# Patient Record
Sex: Female | Born: 1935
Health system: Southern US, Community
[De-identification: ages and names within clinical notes are randomized; demographics above are authoritative.]

## PROBLEM LIST (undated history)

## (undated) DIAGNOSIS — I10 Essential (primary) hypertension: Secondary | ICD-10-CM

## (undated) DIAGNOSIS — E119 Type 2 diabetes mellitus without complications: Secondary | ICD-10-CM

## (undated) DIAGNOSIS — E079 Disorder of thyroid, unspecified: Secondary | ICD-10-CM

## (undated) HISTORY — PX: ABDOMINAL HYSTERECTOMY: SHX81

---

## 1958-10-02 HISTORY — PX: FOOT SURGERY: SHX648

## 1998-12-01 ENCOUNTER — Ambulatory Visit (HOSPITAL_COMMUNITY): Admission: RE | Admit: 1998-12-01 | Discharge: 1998-12-01 | Payer: Self-pay | Admitting: Family Medicine

## 1999-12-28 ENCOUNTER — Ambulatory Visit (HOSPITAL_COMMUNITY): Admission: RE | Admit: 1999-12-28 | Discharge: 1999-12-28 | Payer: Self-pay | Admitting: Family Medicine

## 1999-12-28 ENCOUNTER — Encounter: Payer: Self-pay | Admitting: Family Medicine

## 2000-01-05 ENCOUNTER — Encounter: Payer: Self-pay | Admitting: Family Medicine

## 2000-01-05 ENCOUNTER — Encounter: Admission: RE | Admit: 2000-01-05 | Discharge: 2000-01-05 | Payer: Self-pay | Admitting: Family Medicine

## 2000-05-18 ENCOUNTER — Other Ambulatory Visit: Admission: RE | Admit: 2000-05-18 | Discharge: 2000-05-18 | Payer: Self-pay | Admitting: Family Medicine

## 2000-12-13 ENCOUNTER — Ambulatory Visit (HOSPITAL_COMMUNITY): Admission: RE | Admit: 2000-12-13 | Discharge: 2000-12-13 | Payer: Self-pay | Admitting: Family Medicine

## 2000-12-13 ENCOUNTER — Encounter: Payer: Self-pay | Admitting: Family Medicine

## 2001-01-07 ENCOUNTER — Ambulatory Visit (HOSPITAL_COMMUNITY): Admission: RE | Admit: 2001-01-07 | Discharge: 2001-01-07 | Payer: Self-pay | Admitting: Family Medicine

## 2002-01-09 ENCOUNTER — Encounter: Payer: Self-pay | Admitting: Family Medicine

## 2002-01-09 ENCOUNTER — Encounter: Admission: RE | Admit: 2002-01-09 | Discharge: 2002-01-09 | Payer: Self-pay | Admitting: Family Medicine

## 2002-04-15 ENCOUNTER — Ambulatory Visit (HOSPITAL_COMMUNITY): Admission: RE | Admit: 2002-04-15 | Discharge: 2002-04-15 | Payer: Self-pay | Admitting: Family Medicine

## 2003-01-30 ENCOUNTER — Encounter: Admission: RE | Admit: 2003-01-30 | Discharge: 2003-01-30 | Payer: Self-pay | Admitting: Family Medicine

## 2003-01-30 ENCOUNTER — Encounter: Payer: Self-pay | Admitting: Family Medicine

## 2003-06-18 ENCOUNTER — Encounter: Payer: Self-pay | Admitting: Family Medicine

## 2003-06-18 ENCOUNTER — Encounter: Admission: RE | Admit: 2003-06-18 | Discharge: 2003-06-18 | Payer: Self-pay | Admitting: Family Medicine

## 2003-12-23 ENCOUNTER — Ambulatory Visit (HOSPITAL_COMMUNITY): Admission: RE | Admit: 2003-12-23 | Discharge: 2003-12-23 | Payer: Self-pay | Admitting: Family Medicine

## 2004-02-23 ENCOUNTER — Ambulatory Visit (HOSPITAL_COMMUNITY): Admission: RE | Admit: 2004-02-23 | Discharge: 2004-02-23 | Payer: Self-pay | Admitting: Family Medicine

## 2004-04-12 ENCOUNTER — Ambulatory Visit (HOSPITAL_COMMUNITY): Admission: RE | Admit: 2004-04-12 | Discharge: 2004-04-12 | Payer: Self-pay | Admitting: Family Medicine

## 2004-07-08 ENCOUNTER — Ambulatory Visit: Payer: Self-pay | Admitting: Family Medicine

## 2004-11-09 ENCOUNTER — Ambulatory Visit: Payer: Self-pay | Admitting: Family Medicine

## 2005-03-15 ENCOUNTER — Ambulatory Visit (HOSPITAL_COMMUNITY): Admission: RE | Admit: 2005-03-15 | Discharge: 2005-03-15 | Payer: Self-pay | Admitting: Family Medicine

## 2005-06-13 ENCOUNTER — Ambulatory Visit: Payer: Self-pay | Admitting: Family Medicine

## 2005-07-18 ENCOUNTER — Ambulatory Visit: Payer: Self-pay | Admitting: Family Medicine

## 2005-07-18 ENCOUNTER — Ambulatory Visit: Payer: Self-pay | Admitting: Internal Medicine

## 2005-12-05 ENCOUNTER — Ambulatory Visit: Payer: Self-pay | Admitting: Family Medicine

## 2005-12-26 ENCOUNTER — Ambulatory Visit: Payer: Self-pay | Admitting: Family Medicine

## 2006-01-23 ENCOUNTER — Ambulatory Visit (HOSPITAL_COMMUNITY): Admission: RE | Admit: 2006-01-23 | Discharge: 2006-01-23 | Payer: Self-pay | Admitting: Family Medicine

## 2006-03-08 ENCOUNTER — Ambulatory Visit: Payer: Self-pay | Admitting: Family Medicine

## 2006-03-20 ENCOUNTER — Encounter: Admission: RE | Admit: 2006-03-20 | Discharge: 2006-03-20 | Payer: Self-pay | Admitting: Family Medicine

## 2006-10-23 ENCOUNTER — Ambulatory Visit: Payer: Self-pay | Admitting: Family Medicine

## 2006-11-28 ENCOUNTER — Ambulatory Visit: Payer: Self-pay | Admitting: Family Medicine

## 2007-03-26 ENCOUNTER — Ambulatory Visit: Payer: Self-pay | Admitting: Family Medicine

## 2007-03-27 ENCOUNTER — Encounter: Admission: RE | Admit: 2007-03-27 | Discharge: 2007-03-27 | Payer: Self-pay | Admitting: Family Medicine

## 2007-06-25 ENCOUNTER — Ambulatory Visit: Payer: Self-pay | Admitting: Family Medicine

## 2007-06-25 LAB — CONVERTED CEMR LAB
Calcium: 9.3 mg/dL (ref 8.4–10.5)
Chloride: 105 meq/L (ref 96–112)
Cholesterol: 129 mg/dL (ref 0–200)
Creatinine, Ser: 0.77 mg/dL (ref 0.40–1.20)
HDL: 66 mg/dL (ref >39)
Potassium: 3.9 meq/L (ref 3.5–5.3)
Sodium: 141 meq/L (ref 135–145)
TSH: 1.381 microintl units/mL (ref 0.350–5.50)

## 2007-07-09 ENCOUNTER — Ambulatory Visit: Payer: Self-pay | Admitting: Family Medicine

## 2007-07-16 ENCOUNTER — Ambulatory Visit (HOSPITAL_COMMUNITY): Admission: RE | Admit: 2007-07-16 | Discharge: 2007-07-17 | Payer: Self-pay | Admitting: Ophthalmology

## 2007-08-20 ENCOUNTER — Ambulatory Visit: Payer: Self-pay | Admitting: Family Medicine

## 2007-09-04 ENCOUNTER — Ambulatory Visit (HOSPITAL_COMMUNITY): Admission: RE | Admit: 2007-09-04 | Discharge: 2007-09-04 | Payer: Self-pay | Admitting: Family Medicine

## 2008-02-11 ENCOUNTER — Ambulatory Visit: Payer: Self-pay | Admitting: Family Medicine

## 2008-02-11 LAB — CONVERTED CEMR LAB
ALT: 12 units/L (ref 0–35)
BUN: 14 mg/dL (ref 6–23)
Basophils Absolute: 0 10*3/uL (ref 0.0–0.1)
Calcium: 9.6 mg/dL (ref 8.4–10.5)
Chloride: 102 meq/L (ref 96–112)
Creatinine, Ser: 0.71 mg/dL (ref 0.40–1.20)
Eosinophils Absolute: 0.2 10*3/uL (ref 0.0–0.7)
Eosinophils Relative: 2 % (ref 0–5)
Lymphocytes Relative: 29 % (ref 12–46)
Lymphs Abs: 2.5 10*3/uL (ref 0.7–4.0)
Monocytes Relative: 9 % (ref 3–12)
Neutrophils Relative %: 59 % (ref 43–77)
Platelets: 264 10*3/uL (ref 150–400)
Potassium: 3.9 meq/L (ref 3.5–5.3)
RDW: 15.9 % — ABNORMAL HIGH (ref 11.5–15.5)
Sodium: 143 meq/L (ref 135–145)
TSH: 2.66 microintl units/mL (ref 0.350–5.50)

## 2008-03-31 ENCOUNTER — Encounter: Admission: RE | Admit: 2008-03-31 | Discharge: 2008-03-31 | Payer: Self-pay | Admitting: Family Medicine

## 2008-07-28 ENCOUNTER — Ambulatory Visit: Payer: Self-pay | Admitting: Family Medicine

## 2008-10-06 ENCOUNTER — Ambulatory Visit: Payer: Self-pay | Admitting: Family Medicine

## 2008-10-06 LAB — CONVERTED CEMR LAB
BUN: 18 mg/dL (ref 6–23)
Creatinine, Ser: 0.87 mg/dL (ref 0.40–1.20)
Glucose, Bld: 105 mg/dL — ABNORMAL HIGH (ref 70–99)
Potassium: 3.9 meq/L (ref 3.5–5.3)

## 2009-05-31 ENCOUNTER — Ambulatory Visit: Payer: Self-pay | Admitting: Family Medicine

## 2009-05-31 LAB — CONVERTED CEMR LAB
Basophils Absolute: 0 10*3/uL (ref 0.0–0.1)
Basophils Relative: 0 % (ref 0–1)
Eosinophils Relative: 2 % (ref 0–5)
HCT: 37.6 % (ref 36.0–46.0)
Hemoglobin: 11.8 g/dL — ABNORMAL LOW (ref 12.0–15.0)
Lymphocytes Relative: 26 % (ref 12–46)
Lymphs Abs: 1.8 10*3/uL (ref 0.7–4.0)
MCHC: 31.4 g/dL (ref 30.0–36.0)
Monocytes Absolute: 0.5 10*3/uL (ref 0.1–1.0)
Neutrophils Relative %: 64 % (ref 43–77)
WBC: 7.1 10*3/uL (ref 4.0–10.5)

## 2009-06-15 ENCOUNTER — Ambulatory Visit (HOSPITAL_COMMUNITY): Admission: RE | Admit: 2009-06-15 | Discharge: 2009-06-15 | Payer: Self-pay | Admitting: Family Medicine

## 2009-11-09 ENCOUNTER — Ambulatory Visit: Payer: Self-pay | Admitting: Family Medicine

## 2009-12-14 ENCOUNTER — Ambulatory Visit: Payer: Self-pay | Admitting: Internal Medicine

## 2010-03-08 ENCOUNTER — Ambulatory Visit: Payer: Self-pay | Admitting: Family Medicine

## 2010-03-08 LAB — CONVERTED CEMR LAB
AST: 17 units/L (ref 0–37)
CO2: 25 meq/L (ref 19–32)
Free T4: 1.33 ng/dL (ref 0.80–1.80)
Glucose, Bld: 73 mg/dL (ref 70–99)
HDL: 70 mg/dL (ref >39)
LDL Cholesterol: 95 mg/dL (ref 0–99)
Potassium: 4 meq/L (ref 3.5–5.3)
Sodium: 143 meq/L (ref 135–145)
Total Bilirubin: 0.4 mg/dL (ref 0.3–1.2)
Triglycerides: 59 mg/dL (ref <150)
VLDL: 12 mg/dL (ref 0–40)
Vit D, 25-Hydroxy: 33 ng/mL (ref 30–89)

## 2010-03-31 ENCOUNTER — Ambulatory Visit (HOSPITAL_COMMUNITY): Admission: RE | Admit: 2010-03-31 | Discharge: 2010-03-31 | Payer: Self-pay | Admitting: Family Medicine

## 2010-06-21 ENCOUNTER — Ambulatory Visit (HOSPITAL_COMMUNITY): Admission: RE | Admit: 2010-06-21 | Discharge: 2010-06-21 | Payer: Self-pay | Admitting: Family Medicine

## 2010-10-23 ENCOUNTER — Encounter: Payer: Self-pay | Admitting: Family Medicine

## 2011-02-14 NOTE — Op Note (Signed)
NAMEGRADIE, BUTRICK               ACCOUNT NO.:  [id number]   MEDICAL RECORD NO.:  [id number]          PATIENT TYPE:  AMB   LOCATION:  SDS                          FACILITY:  [facility]   PHYSICIAN:  John D. Ashley Royalty, M.D. DATE OF BIRTH:  08/06/1926   DATE OF PROCEDURE:  07/16/2007  DATE OF DISCHARGE:                               OPERATIVE REPORT   ADMISSION DIAGNOSIS:  Macular hole, left eye and surface retinal  membrane, left eye.   PROCEDURE:  Pars plana vitrectomy left eye, retinal photocoagulation  left eye, membrane peel left eye, serum patch left eye, gas fluid  exchange left eye, Perfluoron propane injection left eye.   SURGEON:  Alan Mulder, M.D.   ASSISTANT:  Rosalie Doctor, MA   ANESTHESIA:  General.   DETAILS:  Usual prep and drape, peritomies at 10, 2 and 4 o'clock, 5 mm  infusion port anchored into place at 4 o'clock.  Contact lens ring  anchored into place at 6 and 12 o'clock.  Provisc placed on the corneal  surface and the flat contact lens was placed.  Pars plana vitrectomy was  begun just behind the pseudophakos.  Vitreous membranes and pigment was  encountered and carefully removed under low suction and rapid cutting.  The vitrectomy was carried down to the macular surface and all vitreous  was removed from the posterior segment.  The silicone tip suction line  was drawn down to the macular surface and the fish strike sign occurred.  A sheet of vitreous was lifted from its attachment to the edge of the  macular hole in the edge of the disk.  It was drawn up into the central  vitreous cavity and removed with the vitreous cutter.  The vitrectomy  was carried out to the far periphery with 30 degrees prismatic lens for  viewing.  The entire vitreous base was trimmed.  The indirect  ophthalmoscope laser was moved into place, 460 burns placed around the  retinal periphery with a power of 400 milliwatts, 1000 microns each and  0.1 seconds each.  The magnifying contact  lens was placed on the corneal  surface and attention was carried to the surface membrane.  The diamond  dusted membrane scraper and the lighted pick were used to engage the  membrane and peel it from its attachments to the macula and the macular  hole edge.  This was lifted free and removed with the vitreous cutter.  The internal limiting membrane was removed.  The gas fluid exchange was  carried out.  Sufficient time was allowed for additional fluid to track  down the walls the eye and collect in the posterior segment.  Serum  patch was prepared during this time and the perfluoropropane was mixed  to 17% concentration.  Serum patch was delivered.  Additional fluid was  removed from the posterior segment.  The perfluoropropane was exchanged  for intravitreal gas at 17% concentration.  The instruments were removed  from the eye and 9-0 nylon was used to close sclerotomy sites.  They  were tested and found to be tight.  The conjunctiva was  closed with wet-  field cautery.  Polymyxin and gentamicin were irrigated into Tenon's  space, Decadron 10 mg was injected to the lower subconjunctival space.  Marcaine was injected around the globe for postop pain.  TobraDex  ophthalmic ointment, a  patch and shield were placed.  The patient was awakened, taken to  recovery in satisfactory condition.  Closing pressure 15 with a  [place] tonometer,  Operative time one hour. The patient is awakened,  taken to recovery in satisfactory condition.      Jennifer Zhang. Ashley Royalty, M.D.  Electronically Signed     JDM/MEDQ  D:  07/16/2007  T:  07/17/2007  Job:  [id number]

## 2011-06-19 ENCOUNTER — Other Ambulatory Visit (HOSPITAL_COMMUNITY): Payer: Self-pay | Admitting: Family Medicine

## 2011-06-19 ENCOUNTER — Other Ambulatory Visit: Payer: Self-pay | Admitting: Family Medicine

## 2011-06-19 DIAGNOSIS — Z1231 Encounter for screening mammogram for malignant neoplasm of breast: Secondary | ICD-10-CM

## 2011-06-27 ENCOUNTER — Ambulatory Visit
Admission: RE | Admit: 2011-06-27 | Discharge: 2011-06-27 | Disposition: A | Discharge status: Home / Self Care | Payer: Medicare Other | Source: Ambulatory Visit | Attending: Family Medicine | Admitting: Family Medicine

## 2011-06-27 DIAGNOSIS — Z1231 Encounter for screening mammogram for malignant neoplasm of breast: Secondary | ICD-10-CM

## 2011-06-28 ENCOUNTER — Ambulatory Visit: Payer: Self-pay

## 2011-07-13 LAB — BASIC METABOLIC PANEL
CO2: 27
Calcium: 9.5
Chloride: 103
GFR calc Af Amer: >60
GFR calc non Af Amer: >60
Potassium: 3.9
Sodium: 140

## 2011-07-13 LAB — CBC
MCHC: 33.5
Platelets: 248
RBC: 4.18
RDW: 15.5 — ABNORMAL HIGH

## 2011-07-13 LAB — AUTOLOGOUS SERUM PATCH PREP

## 2012-01-30 ENCOUNTER — Encounter (INDEPENDENT_AMBULATORY_CARE_PROVIDER_SITE_OTHER): Payer: Medicare Other | Admitting: Ophthalmology

## 2012-01-30 DIAGNOSIS — E11319 Type 2 diabetes mellitus with unspecified diabetic retinopathy without macular edema: Secondary | ICD-10-CM

## 2012-01-30 DIAGNOSIS — I10 Essential (primary) hypertension: Secondary | ICD-10-CM

## 2012-01-30 DIAGNOSIS — E1165 Type 2 diabetes mellitus with hyperglycemia: Secondary | ICD-10-CM

## 2012-01-30 DIAGNOSIS — H35039 Hypertensive retinopathy, unspecified eye: Secondary | ICD-10-CM

## 2012-01-30 DIAGNOSIS — H35349 Macular cyst, hole, or pseudohole, unspecified eye: Secondary | ICD-10-CM

## 2012-01-30 DIAGNOSIS — H43819 Vitreous degeneration, unspecified eye: Secondary | ICD-10-CM

## 2012-06-17 ENCOUNTER — Other Ambulatory Visit: Payer: Self-pay | Admitting: Family Medicine

## 2012-12-03 ENCOUNTER — Other Ambulatory Visit: Payer: Self-pay

## 2013-01-03 ENCOUNTER — Ambulatory Visit: Payer: Medicare Other

## 2013-01-06 ENCOUNTER — Ambulatory Visit
Admission: RE | Admit: 2013-01-06 | Discharge: 2013-01-06 | Disposition: A | Discharge status: Home / Self Care | Payer: Medicare Other | Source: Ambulatory Visit

## 2013-01-06 DIAGNOSIS — Z1231 Encounter for screening mammogram for malignant neoplasm of breast: Secondary | ICD-10-CM

## 2013-01-29 ENCOUNTER — Ambulatory Visit (INDEPENDENT_AMBULATORY_CARE_PROVIDER_SITE_OTHER): Payer: Medicare Other | Admitting: Ophthalmology

## 2013-01-29 DIAGNOSIS — E1139 Type 2 diabetes mellitus with other diabetic ophthalmic complication: Secondary | ICD-10-CM

## 2013-01-29 DIAGNOSIS — H35039 Hypertensive retinopathy, unspecified eye: Secondary | ICD-10-CM

## 2013-01-29 DIAGNOSIS — H43819 Vitreous degeneration, unspecified eye: Secondary | ICD-10-CM

## 2013-01-29 DIAGNOSIS — I10 Essential (primary) hypertension: Secondary | ICD-10-CM

## 2013-01-29 DIAGNOSIS — E11319 Type 2 diabetes mellitus with unspecified diabetic retinopathy without macular edema: Secondary | ICD-10-CM

## 2013-01-29 DIAGNOSIS — H3530 Unspecified macular degeneration: Secondary | ICD-10-CM

## 2013-01-29 DIAGNOSIS — H35349 Macular cyst, hole, or pseudohole, unspecified eye: Secondary | ICD-10-CM

## 2013-02-04 ENCOUNTER — Ambulatory Visit: Payer: Medicare Other

## 2013-02-11 ENCOUNTER — Ambulatory Visit: Payer: Medicare Other | Attending: Internal Medicine

## 2013-02-11 DIAGNOSIS — IMO0001 Reserved for inherently not codable concepts without codable children: Secondary | ICD-10-CM | POA: Insufficient documentation

## 2013-02-11 DIAGNOSIS — R262 Difficulty in walking, not elsewhere classified: Secondary | ICD-10-CM | POA: Insufficient documentation

## 2013-02-11 DIAGNOSIS — R42 Dizziness and giddiness: Secondary | ICD-10-CM | POA: Insufficient documentation

## 2013-02-11 DIAGNOSIS — R269 Unspecified abnormalities of gait and mobility: Secondary | ICD-10-CM | POA: Insufficient documentation

## 2013-02-14 ENCOUNTER — Ambulatory Visit: Payer: Medicare Other | Admitting: Rehabilitative and Restorative Service Providers"

## 2013-02-17 ENCOUNTER — Ambulatory Visit: Payer: Medicare Other | Admitting: Rehabilitative and Restorative Service Providers"

## 2013-02-19 ENCOUNTER — Ambulatory Visit: Payer: Medicare Other

## 2013-02-26 ENCOUNTER — Ambulatory Visit: Payer: Medicare Other | Admitting: Rehabilitative and Restorative Service Providers"

## 2013-02-28 ENCOUNTER — Ambulatory Visit: Payer: Medicare Other | Admitting: Rehabilitative and Restorative Service Providers"

## 2013-03-03 ENCOUNTER — Ambulatory Visit: Payer: Medicare Other | Attending: Internal Medicine

## 2013-03-03 DIAGNOSIS — R42 Dizziness and giddiness: Secondary | ICD-10-CM | POA: Insufficient documentation

## 2013-03-03 DIAGNOSIS — R269 Unspecified abnormalities of gait and mobility: Secondary | ICD-10-CM | POA: Insufficient documentation

## 2013-03-03 DIAGNOSIS — IMO0001 Reserved for inherently not codable concepts without codable children: Secondary | ICD-10-CM | POA: Insufficient documentation

## 2013-03-03 DIAGNOSIS — R262 Difficulty in walking, not elsewhere classified: Secondary | ICD-10-CM | POA: Insufficient documentation

## 2013-03-05 ENCOUNTER — Ambulatory Visit: Payer: Medicare Other | Admitting: Rehabilitative and Restorative Service Providers"

## 2013-03-10 ENCOUNTER — Ambulatory Visit: Payer: Medicare Other

## 2013-03-12 ENCOUNTER — Ambulatory Visit: Payer: Medicare Other

## 2013-03-17 ENCOUNTER — Ambulatory Visit: Payer: Medicare Other

## 2013-03-19 ENCOUNTER — Ambulatory Visit: Payer: Medicare Other | Admitting: Rehabilitative and Restorative Service Providers"

## 2013-03-24 ENCOUNTER — Ambulatory Visit: Payer: Medicare Other | Admitting: Physical Therapy

## 2013-03-26 ENCOUNTER — Ambulatory Visit: Payer: Medicare Other | Admitting: Physical Therapy

## 2013-03-31 ENCOUNTER — Ambulatory Visit: Payer: Medicare Other | Admitting: Physical Therapy

## 2013-04-02 ENCOUNTER — Ambulatory Visit: Payer: Medicare Other | Attending: Internal Medicine | Admitting: Rehabilitative and Restorative Service Providers"

## 2013-04-02 DIAGNOSIS — R269 Unspecified abnormalities of gait and mobility: Secondary | ICD-10-CM | POA: Insufficient documentation

## 2013-04-02 DIAGNOSIS — R42 Dizziness and giddiness: Secondary | ICD-10-CM | POA: Insufficient documentation

## 2013-04-02 DIAGNOSIS — R262 Difficulty in walking, not elsewhere classified: Secondary | ICD-10-CM | POA: Insufficient documentation

## 2013-04-02 DIAGNOSIS — IMO0001 Reserved for inherently not codable concepts without codable children: Secondary | ICD-10-CM | POA: Insufficient documentation

## 2013-04-07 ENCOUNTER — Ambulatory Visit: Payer: Medicare Other | Admitting: Rehabilitative and Restorative Service Providers"

## 2013-04-09 ENCOUNTER — Ambulatory Visit: Payer: Medicare Other | Admitting: Rehabilitative and Restorative Service Providers"

## 2013-04-14 ENCOUNTER — Ambulatory Visit: Payer: Medicare Other | Admitting: Rehabilitative and Restorative Service Providers"

## 2013-04-16 ENCOUNTER — Ambulatory Visit: Payer: Medicare Other | Admitting: Rehabilitative and Restorative Service Providers"

## 2013-04-21 ENCOUNTER — Ambulatory Visit: Payer: Medicare Other

## 2013-04-23 ENCOUNTER — Ambulatory Visit: Payer: Medicare Other

## 2014-01-08 ENCOUNTER — Other Ambulatory Visit: Payer: Self-pay

## 2014-01-08 DIAGNOSIS — Z1231 Encounter for screening mammogram for malignant neoplasm of breast: Secondary | ICD-10-CM

## 2014-02-02 ENCOUNTER — Ambulatory Visit
Admission: RE | Admit: 2014-02-02 | Discharge: 2014-02-02 | Disposition: A | Discharge status: Home / Self Care | Payer: Commercial Managed Care - HMO | Source: Ambulatory Visit

## 2014-02-02 ENCOUNTER — Encounter (INDEPENDENT_AMBULATORY_CARE_PROVIDER_SITE_OTHER): Payer: Self-pay

## 2014-02-02 ENCOUNTER — Ambulatory Visit (INDEPENDENT_AMBULATORY_CARE_PROVIDER_SITE_OTHER): Payer: Medicare Other | Admitting: Ophthalmology

## 2014-02-02 DIAGNOSIS — Z1231 Encounter for screening mammogram for malignant neoplasm of breast: Secondary | ICD-10-CM

## 2014-02-04 ENCOUNTER — Ambulatory Visit (INDEPENDENT_AMBULATORY_CARE_PROVIDER_SITE_OTHER): Payer: Medicare HMO | Admitting: Ophthalmology

## 2014-02-04 DIAGNOSIS — E11319 Type 2 diabetes mellitus with unspecified diabetic retinopathy without macular edema: Secondary | ICD-10-CM

## 2014-02-04 DIAGNOSIS — H35039 Hypertensive retinopathy, unspecified eye: Secondary | ICD-10-CM

## 2014-02-04 DIAGNOSIS — H35369 Drusen (degenerative) of macula, unspecified eye: Secondary | ICD-10-CM

## 2014-02-04 DIAGNOSIS — H43819 Vitreous degeneration, unspecified eye: Secondary | ICD-10-CM

## 2014-02-04 DIAGNOSIS — I10 Essential (primary) hypertension: Secondary | ICD-10-CM

## 2014-02-04 DIAGNOSIS — E1139 Type 2 diabetes mellitus with other diabetic ophthalmic complication: Secondary | ICD-10-CM

## 2014-02-04 DIAGNOSIS — E1165 Type 2 diabetes mellitus with hyperglycemia: Secondary | ICD-10-CM

## 2014-09-10 ENCOUNTER — Emergency Department (HOSPITAL_COMMUNITY): Payer: Medicare HMO

## 2014-09-10 ENCOUNTER — Emergency Department (HOSPITAL_COMMUNITY)
Admission: EM | Admit: 2014-09-10 | Discharge: 2014-09-10 | Disposition: A | Discharge status: Home / Self Care | Payer: Medicare HMO | Attending: Emergency Medicine | Admitting: Emergency Medicine

## 2014-09-10 ENCOUNTER — Encounter (HOSPITAL_COMMUNITY): Payer: Self-pay | Admitting: *Deleted

## 2014-09-10 DIAGNOSIS — E119 Type 2 diabetes mellitus without complications: Secondary | ICD-10-CM | POA: Insufficient documentation

## 2014-09-10 DIAGNOSIS — R42 Dizziness and giddiness: Secondary | ICD-10-CM | POA: Insufficient documentation

## 2014-09-10 DIAGNOSIS — Z79899 Other long term (current) drug therapy: Secondary | ICD-10-CM | POA: Insufficient documentation

## 2014-09-10 DIAGNOSIS — R112 Nausea with vomiting, unspecified: Secondary | ICD-10-CM | POA: Insufficient documentation

## 2014-09-10 DIAGNOSIS — I10 Essential (primary) hypertension: Secondary | ICD-10-CM | POA: Insufficient documentation

## 2014-09-10 DIAGNOSIS — Z87891 Personal history of nicotine dependence: Secondary | ICD-10-CM | POA: Diagnosis not present

## 2014-09-10 DIAGNOSIS — E079 Disorder of thyroid, unspecified: Secondary | ICD-10-CM | POA: Diagnosis not present

## 2014-09-10 DIAGNOSIS — Z794 Long term (current) use of insulin: Secondary | ICD-10-CM | POA: Diagnosis not present

## 2014-09-10 DIAGNOSIS — R61 Generalized hyperhidrosis: Secondary | ICD-10-CM | POA: Insufficient documentation

## 2014-09-10 HISTORY — DX: Type 2 diabetes mellitus without complications: E11.9

## 2014-09-10 HISTORY — DX: Essential (primary) hypertension: I10

## 2014-09-10 HISTORY — DX: Disorder of thyroid, unspecified: E07.9

## 2014-09-10 LAB — URINALYSIS, ROUTINE W REFLEX MICROSCOPIC
BILIRUBIN URINE: NEGATIVE
Glucose, UA: NEGATIVE mg/dL
Hgb urine dipstick: NEGATIVE
KETONES UR: NEGATIVE mg/dL
NITRITE: NEGATIVE
Protein, ur: NEGATIVE mg/dL
Specific Gravity, Urine: 1.017 (ref 1.005–1.030)
UROBILINOGEN UA: 0.2 mg/dL (ref 0.0–1.0)
pH: 6.5 (ref 5.0–8.0)

## 2014-09-10 LAB — BASIC METABOLIC PANEL
Anion gap: 11 (ref 5–15)
BUN: 12 mg/dL (ref 6–23)
CHLORIDE: 104 meq/L (ref 96–112)
CO2: 26 mEq/L (ref 19–32)
CREATININE: 0.64 mg/dL (ref 0.50–1.10)
Calcium: 9.4 mg/dL (ref 8.4–10.5)
GFR calc non Af Amer: 79 mL/min — ABNORMAL LOW (ref >90)
Glucose, Bld: 120 mg/dL — ABNORMAL HIGH (ref 70–99)
Potassium: 3.6 mEq/L — ABNORMAL LOW (ref 3.7–5.3)
SODIUM: 141 meq/L (ref 137–147)

## 2014-09-10 LAB — CBC WITH DIFFERENTIAL/PLATELET
BASOS ABS: 0 10*3/uL (ref 0.0–0.1)
BASOS PCT: 0 % (ref 0–1)
Eosinophils Absolute: 0.2 10*3/uL (ref 0.0–0.7)
Eosinophils Relative: 2 % (ref 0–5)
HEMATOCRIT: 34.3 % — AB (ref 36.0–46.0)
Hemoglobin: 11.2 g/dL — ABNORMAL LOW (ref 12.0–15.0)
LYMPHS PCT: 17 % (ref 12–46)
Lymphs Abs: 1.6 10*3/uL (ref 0.7–4.0)
MCH: 29.2 pg (ref 26.0–34.0)
MCHC: 32.7 g/dL (ref 30.0–36.0)
MCV: 89.6 fL (ref 78.0–100.0)
MONO ABS: 0.6 10*3/uL (ref 0.1–1.0)
Monocytes Relative: 6 % (ref 3–12)
NEUTROS PCT: 75 % (ref 43–77)
Neutro Abs: 6.9 10*3/uL (ref 1.7–7.7)
Platelets: 207 10*3/uL (ref 150–400)
RBC: 3.83 MIL/uL — ABNORMAL LOW (ref 3.87–5.11)
RDW: 14.9 % (ref 11.5–15.5)
WBC: 9.2 10*3/uL (ref 4.0–10.5)

## 2014-09-10 LAB — URINE MICROSCOPIC-ADD ON

## 2014-09-10 MED ORDER — ONDANSETRON 4 MG PO TBDP: 4.0000 mg | ORAL_TABLET | Freq: Three times a day (TID) | ORAL | Status: DC | PRN

## 2014-09-10 MED ORDER — ONDANSETRON 4 MG PO TBDP
4.0000 mg | ORAL_TABLET | Freq: Once | ORAL | Status: AC
  Administered 2014-09-10: 4 mg via ORAL
  Filled 2014-09-10: qty 1

## 2014-09-10 NOTE — Discharge Instructions (Signed)
Discharged home go home and rest. Take Zofran as needed for the nausea. Return for any new or worse symptoms persistent dizziness or persistent vomiting. Make an appointment to follow-up with your regular doctor.

## 2014-09-10 NOTE — ED Notes (Signed)
Pt [facility]. Pt states she was riding the bus when she became extremely dizzy and became diaphoretic. Pt. Reports she began feeling weak and sleepy and is still having these sx, but the dizziness and diaphoresis has resolved.

## 2014-09-10 NOTE — ED Provider Notes (Signed)
CSN: [id number]     Arrival date & time 09/10/14  [id number] History   First MD Initiated Contact with Patient 09/10/14 0857     Chief Complaint  Patient presents with  . Dizziness  . Weakness     (Consider location/radiation/quality/duration/timing/severity/associated sxs/prior Treatment) Patient is a 80 y.o. female presenting with dizziness and weakness. The history is provided by the patient and the EMS personnel.  Dizziness Associated symptoms: nausea and vomiting   Associated symptoms: no chest pain, no headaches and no shortness of breath   Weakness Pertinent negatives include no chest pain, no abdominal pain, no headaches and no shortness of breath.   patient brought in by EMS she was on the [facility] bus. Patient became dizzy diaphoretic nauseated. Patient had vomiting 1 at about 8 in the morning prior to getting on the bus. Patient denied any shortness of breath any chest pain or headache. He did have some diaphoresis. Patient now feels better.  Past Medical History  Diagnosis Date  . Diabetes mellitus without complication   . Hypertension   . Thyroid disease    Past Surgical History  Procedure Laterality Date  . Abdominal hysterectomy    . Foot surgery Right 1960   Family History  Problem Relation Age of Onset  . Cancer Mother   . Hypertension Mother   . Migraines Mother   . Diabetes Brother    History  Substance Use Topics  . Smoking status: Former Games developermoker  . Smokeless tobacco: Not on file  . Alcohol Use: No   OB History    No data available     Review of Systems  Constitutional: Positive for diaphoresis. Negative for fever.  HENT: Negative for congestion.   Eyes: Negative for visual disturbance.  Respiratory: Negative for shortness of breath.   Cardiovascular: Negative for chest pain.  Gastrointestinal: Positive for nausea and vomiting. Negative for abdominal pain.  Genitourinary: Negative for dysuria.  Musculoskeletal: Negative for back pain.   Skin: Negative for rash.  Neurological: Positive for dizziness. Negative for syncope, speech difficulty, weakness, numbness and headaches.  Hematological: Does not bruise/bleed easily.  Psychiatric/Behavioral: Negative for confusion.      Allergies  Aspirin  Home Medications   Prior to Admission medications   Medication Sig Start Date End Date Taking? Authorizing Provider  cholecalciferol (VITAMIN D) 1000 UNITS tablet Take 2,000 Units by mouth daily.   Yes Historical Provider, MD  Insulin Aspart Prot & Aspart (NOVOLOG MIX 70/30 FLEXPEN) (70-30) 100 UNIT/ML Pen Inject 15 Units into the skin 2 (two) times daily.   Yes Historical Provider, MD  isosorbide dinitrate (ISORDIL) 10 MG tablet Take 10 mg by mouth 2 (two) times daily.   Yes Historical Provider, MD  levothyroxine (SYNTHROID, LEVOTHROID) 50 MCG tablet Take 50 mcg by mouth daily before breakfast.   Yes Historical Provider, MD  lisinopril-hydrochlorothiazide (PRINZIDE,ZESTORETIC) 20-25 MG per tablet Take 1 tablet by mouth daily.   Yes Historical Provider, MD  potassium chloride (K-DUR,KLOR-CON) 10 MEQ tablet Take 10 mEq by mouth 2 (two) times daily.   Yes Historical Provider, MD  pravastatin (PRAVACHOL) 80 MG tablet Take 80 mg by mouth daily.   Yes Historical Provider, MD  thiamine (VITAMIN B-1) 100 MG tablet Take 100 mg by mouth daily.   Yes Historical Provider, MD  verapamil (CALAN-SR) 240 MG CR tablet Take 240 mg by mouth at bedtime.   Yes Historical Provider, MD  vitamin B-12 (CYANOCOBALAMIN) 100 MCG tablet Take 100 mcg by mouth daily.  Yes Historical Provider, MD  ondansetron (ZOFRAN ODT) 4 MG disintegrating tablet Take 1 tablet (4 mg total) by mouth every 8 (eight) hours as needed for nausea or vomiting. 09/10/14   Vanetta Mulders, MD   BP 124/91 mmHg  Pulse 53  Temp(Src) 97.9 F (36.6 C) (Oral)  Resp 20  Ht 5\' 4"  (1.626 m)  Wt 186 lb (84.369 kg)  BMI 31.91 kg/m2  SpO2 96% Physical Exam  Constitutional: She is oriented  to person, place, and time. She appears well-developed and well-nourished. No distress.  HENT:  Head: Normocephalic and atraumatic.  Mouth/Throat: Oropharynx is clear and moist.  Eyes: Conjunctivae and EOM are normal. Pupils are equal, round, and reactive to light.  Neck: Normal range of motion.  Cardiovascular: Normal rate, regular rhythm and normal heart sounds.   No murmur heard. Pulmonary/Chest: Effort normal and breath sounds normal. No respiratory distress.  Abdominal: Soft. Bowel sounds are normal. There is no tenderness.  Musculoskeletal: Normal range of motion. She exhibits no edema.  Neurological: She is alert and oriented to person, place, and time. No cranial nerve deficit. She exhibits normal muscle tone. Coordination normal.  Skin: Skin is warm. No rash noted.  Nursing note and vitals reviewed.   ED Course  Procedures (including critical care time) Labs Review Labs Reviewed  CBC WITH DIFFERENTIAL - Abnormal; Notable for the following:    RBC 3.83 (*)    Hemoglobin 11.2 (*)    HCT 34.3 (*)    All other components within normal limits  BASIC METABOLIC PANEL - Abnormal; Notable for the following:    Potassium 3.6 (*)    Glucose, Bld 120 (*)    GFR calc non Af Amer 79 (*)    All other components within normal limits  URINALYSIS, ROUTINE W REFLEX MICROSCOPIC - Abnormal; Notable for the following:    Leukocytes, UA SMALL (*)    All other components within normal limits  URINE MICROSCOPIC-ADD ON   Results for orders placed or performed during the hospital encounter of 09/10/14  CBC with Differential  Result Value Ref Range   WBC 9.2 4.0 - 10.5 K/uL   RBC 3.83 (L) 3.87 - 5.11 MIL/uL   Hemoglobin 11.2 (L) 12.0 - 15.0 g/dL   HCT 40.9 (L) 81.1 - 91.4 %   MCV 89.6 78.0 - 100.0 fL   MCH 29.2 26.0 - 34.0 pg   MCHC 32.7 30.0 - 36.0 g/dL   RDW 78.2 95.6 - 21.3 %   Platelets 207 150 - 400 K/uL   Neutrophils Relative % 75 43 - 77 %   Neutro Abs 6.9 1.7 - 7.7 K/uL    Lymphocytes Relative 17 12 - 46 %   Lymphs Abs 1.6 0.7 - 4.0 K/uL   Monocytes Relative 6 3 - 12 %   Monocytes Absolute 0.6 0.1 - 1.0 K/uL   Eosinophils Relative 2 0 - 5 %   Eosinophils Absolute 0.2 0.0 - 0.7 K/uL   Basophils Relative 0 0 - 1 %   Basophils Absolute 0.0 0.0 - 0.1 K/uL  Basic metabolic panel  Result Value Ref Range   Sodium 141 137 - 147 mEq/L   Potassium 3.6 (L) 3.7 - 5.3 mEq/L   Chloride 104 96 - 112 mEq/L   CO2 26 19 - 32 mEq/L   Glucose, Bld 120 (H) 70 - 99 mg/dL   BUN 12 6 - 23 mg/dL   Creatinine, Ser 0.86 0.50 - 1.10 mg/dL   Calcium 9.4 8.4 -  10.5 mg/dL   GFR calc non Af Amer 79 (L) >90 mL/min   GFR calc Af Amer >90 >90 mL/min   Anion gap 11 5 - 15  Urinalysis, Routine w reflex microscopic  Result Value Ref Range   Color, Urine YELLOW YELLOW   APPearance CLEAR CLEAR   Specific Gravity, Urine 1.017 1.005 - 1.030   pH 6.5 5.0 - 8.0   Glucose, UA NEGATIVE NEGATIVE mg/dL   Hgb urine dipstick NEGATIVE NEGATIVE   Bilirubin Urine NEGATIVE NEGATIVE   Ketones, ur NEGATIVE NEGATIVE mg/dL   Protein, ur NEGATIVE NEGATIVE mg/dL   Urobilinogen, UA 0.2 0.0 - 1.0 mg/dL   Nitrite NEGATIVE NEGATIVE   Leukocytes, UA SMALL (A) NEGATIVE  Urine microscopic-add on  Result Value Ref Range   Squamous Epithelial / LPF RARE RARE   WBC, UA 0-2 <3 WBC/hpf    Imaging Review Dg Chest 2 View  09/10/2014   CLINICAL DATA:  Dizziness.  Transient diaphoresis.  EXAM: CHEST  2 VIEW  COMPARISON:  07/16/2007  FINDINGS: Heart size and pulmonary vascularity are normal. Slight scarring at both lung bases, stable. No effusions or infiltrates. No acute osseous abnormality. Thoracolumbar scoliosis, unchanged.  IMPRESSION: No acute abnormalities.   Electronically Signed   By: Geanie CooleyJim  Maxwell M.D.   On: 09/10/2014 10:55     EKG Interpretation   Date/Time:  Thursday September 10 2014 09:40:04 EST Ventricular Rate:  58 PR Interval:  147 QRS Duration: 74 QT Interval:  475 QTC Calculation: 467 R  Axis:   -15 Text Interpretation:  Sinus rhythm Left ventricular hypertrophy No  previous tracing Confirmed by Calisa Luckenbaugh  MD, Nastashia Gallo ([id number]) on 09/10/2014  9:58:00 AM      MDM   Final diagnoses:  Dizziness    Patient brought in by EMS. Patient [facility] she became extremely dizzy diaphoretic felt nauseated. Patient had vomited once earlier today before going to the senior center. That was about 8:00. Patient now feels better. Denied any chest pain shortness of breath headache no syncope. Patient's basic lab workup without any significant findings.  Patient be discharged home with Zofran precautions and follow-up with her doctor. Patient will return for any new or worse symptoms.  Patient symptoms could be related to a viral gastroenteritis. Even no there is no diarrhea. Patient's symptoms also could be related to dizziness that led to the vomiting and diaphoresis. No persistent dizziness.  Vanetta MuldersScott Meloney Feld, MD 09/10/14 1327

## 2015-01-27 ENCOUNTER — Other Ambulatory Visit: Payer: Self-pay

## 2015-01-27 DIAGNOSIS — Z1231 Encounter for screening mammogram for malignant neoplasm of breast: Secondary | ICD-10-CM

## 2015-02-10 ENCOUNTER — Ambulatory Visit (INDEPENDENT_AMBULATORY_CARE_PROVIDER_SITE_OTHER): Payer: Medicare HMO | Admitting: Ophthalmology

## 2015-02-11 ENCOUNTER — Ambulatory Visit
Admission: RE | Admit: 2015-02-11 | Discharge: 2015-02-11 | Disposition: A | Discharge status: Home / Self Care | Payer: Medicare HMO | Source: Ambulatory Visit

## 2015-02-11 DIAGNOSIS — Z1231 Encounter for screening mammogram for malignant neoplasm of breast: Secondary | ICD-10-CM

## 2015-09-10 ENCOUNTER — Encounter: Payer: Self-pay | Admitting: Podiatry

## 2015-09-10 ENCOUNTER — Ambulatory Visit (INDEPENDENT_AMBULATORY_CARE_PROVIDER_SITE_OTHER): Payer: Medicare HMO | Admitting: Podiatry

## 2015-09-10 VITALS — BP 137/75 | HR 84 | Resp 14

## 2015-09-10 DIAGNOSIS — L84 Corns and callosities: Secondary | ICD-10-CM

## 2015-09-10 DIAGNOSIS — B351 Tinea unguium: Secondary | ICD-10-CM

## 2015-09-10 DIAGNOSIS — M79676 Pain in unspecified toe(s): Secondary | ICD-10-CM | POA: Diagnosis not present

## 2015-09-10 NOTE — Progress Notes (Signed)
   Subjective:    Patient ID: Jennifer Zhang, female    DOB: 06-14-1926, 81 y.o.   MRN: [id number]  HPIThis patient presents to the office today with painful toenails on both feet and painful corn fifth toe right foot.  She says her nails have grown long and thick and are painful walking and wearing her shoes.  Paniful corn walking in shoes.  She presents for preventive foot care services. The patient presents here today with B/L toenail debridement.   Review of Systems  All other systems reviewed and are negative.      Objective:   Physical Exam GENERAL APPEARANCE: Alert, conversant. Appropriately groomed. No acute distress.  VASCULAR: Pedal [facility]able [facility]  [facility].  Capillary refill time is immediate to all digits,  Normal temperature gradient.  Digital hair growth is present bilateral  NEUROLOGIC: sensation is normal to 5.07 monofilament at 5/5 sites bilateral.  Light touch is intact bilateral, Muscle strength normal.  MUSCULOSKELETAL: acceptable muscle strength, tone and stability bilateral.  Intrinsic muscluature intact bilateral.  Rectus appearance of foot and digits noted bilateral.   DERMATOLOGIC: skin color, texture, and turgor are within normal limits.  No preulcerative lesions or ulcers  are seen, no interdigital maceration noted.  No open lesions present.   No drainage noted. Heloma durum fifth toe right foot.  NAILS  Thick disfigured discolored nails with subungual debris.         Assessment & Plan:  Onychomycosis  B/L  Heloma durum fifth toe right  IE  Debride nails both feet.  Debride corn. RTC 3 months  Helane GuntherGregory Decarla Siemen DPM

## 2015-11-01 DIAGNOSIS — Z0000 Encounter for general adult medical examination without abnormal findings: Secondary | ICD-10-CM | POA: Diagnosis not present

## 2015-11-01 DIAGNOSIS — M5137 Other intervertebral disc degeneration, lumbosacral region: Secondary | ICD-10-CM | POA: Diagnosis not present

## 2015-11-01 DIAGNOSIS — E785 Hyperlipidemia, unspecified: Secondary | ICD-10-CM | POA: Diagnosis not present

## 2015-11-01 DIAGNOSIS — R011 Cardiac murmur, unspecified: Secondary | ICD-10-CM | POA: Diagnosis not present

## 2015-11-01 DIAGNOSIS — M179 Osteoarthritis of knee, unspecified: Secondary | ICD-10-CM | POA: Diagnosis not present

## 2015-11-01 DIAGNOSIS — E119 Type 2 diabetes mellitus without complications: Secondary | ICD-10-CM | POA: Diagnosis not present

## 2015-11-01 DIAGNOSIS — E1140 Type 2 diabetes mellitus with diabetic neuropathy, unspecified: Secondary | ICD-10-CM | POA: Diagnosis not present

## 2015-11-01 DIAGNOSIS — I2510 Atherosclerotic heart disease of native coronary artery without angina pectoris: Secondary | ICD-10-CM | POA: Diagnosis not present

## 2015-11-01 DIAGNOSIS — H3530 Unspecified macular degeneration: Secondary | ICD-10-CM | POA: Diagnosis not present

## 2015-11-01 DIAGNOSIS — I10 Essential (primary) hypertension: Secondary | ICD-10-CM | POA: Diagnosis not present

## 2015-11-01 DIAGNOSIS — E559 Vitamin D deficiency, unspecified: Secondary | ICD-10-CM | POA: Diagnosis not present

## 2015-11-12 DIAGNOSIS — I10 Essential (primary) hypertension: Secondary | ICD-10-CM | POA: Diagnosis not present

## 2015-11-12 DIAGNOSIS — I2510 Atherosclerotic heart disease of native coronary artery without angina pectoris: Secondary | ICD-10-CM | POA: Diagnosis not present

## 2015-11-12 DIAGNOSIS — R011 Cardiac murmur, unspecified: Secondary | ICD-10-CM | POA: Diagnosis not present

## 2015-11-30 DIAGNOSIS — I2510 Atherosclerotic heart disease of native coronary artery without angina pectoris: Secondary | ICD-10-CM | POA: Diagnosis not present

## 2015-11-30 DIAGNOSIS — I10 Essential (primary) hypertension: Secondary | ICD-10-CM | POA: Diagnosis not present

## 2015-11-30 DIAGNOSIS — M179 Osteoarthritis of knee, unspecified: Secondary | ICD-10-CM | POA: Diagnosis not present

## 2015-11-30 DIAGNOSIS — E1140 Type 2 diabetes mellitus with diabetic neuropathy, unspecified: Secondary | ICD-10-CM | POA: Diagnosis not present

## 2015-11-30 DIAGNOSIS — H3530 Unspecified macular degeneration: Secondary | ICD-10-CM | POA: Diagnosis not present

## 2015-11-30 DIAGNOSIS — E559 Vitamin D deficiency, unspecified: Secondary | ICD-10-CM | POA: Diagnosis not present

## 2015-11-30 DIAGNOSIS — E119 Type 2 diabetes mellitus without complications: Secondary | ICD-10-CM | POA: Diagnosis not present

## 2015-11-30 DIAGNOSIS — M5137 Other intervertebral disc degeneration, lumbosacral region: Secondary | ICD-10-CM | POA: Diagnosis not present

## 2015-11-30 DIAGNOSIS — E785 Hyperlipidemia, unspecified: Secondary | ICD-10-CM | POA: Diagnosis not present

## 2015-12-24 ENCOUNTER — Ambulatory Visit (INDEPENDENT_AMBULATORY_CARE_PROVIDER_SITE_OTHER): Payer: Medicare HMO | Admitting: Podiatry

## 2015-12-24 ENCOUNTER — Encounter: Payer: Self-pay | Admitting: Podiatry

## 2015-12-24 DIAGNOSIS — B351 Tinea unguium: Secondary | ICD-10-CM

## 2015-12-24 DIAGNOSIS — M79676 Pain in unspecified toe(s): Secondary | ICD-10-CM

## 2015-12-24 DIAGNOSIS — L84 Corns and callosities: Secondary | ICD-10-CM

## 2015-12-24 NOTE — Progress Notes (Signed)
   Subjective:    Patient ID: Jennifer Zhang, female    DOB: November 09, 1925, 82 y.o.   MRN: [id number]  HPIThis patient presents to the office today with painful toenails on both feet and painful corn fifth toe right foot.  She says her nails have grown long and thick and are painful walking and wearing her shoes.  Paniful corn walking in shoes.  She presents for preventive foot care services. The patient presents here today with B/L toenail debridement.   Review of Systems  All other systems reviewed and are negative.      Objective:   Physical Exam GENERAL APPEARANCE: Alert, conversant. Appropriately groomed. No acute distress.  VASCULAR: [facility]  [facility].  Capillary refill time is immediate to all digits,  Normal temperature gradient.  Digital hair growth is present bilateral  NEUROLOGIC: sensation is normal to 5.07 monofilament at 5/5 sites bilateral.  Light touch is intact bilateral, Muscle strength normal.  MUSCULOSKELETAL: acceptable muscle strength, tone and stability bilateral.  Intrinsic muscluature intact bilateral.  Rectus appearance of foot and digits noted bilateral.   DERMATOLOGIC: skin color, texture, and turgor are within normal limits.  No preulcerative lesions or ulcers  are seen, no interdigital maceration noted.  No open lesions present.   No drainage noted. Heloma durum fifth toe right foot. Pes planus B/L.  HAV B/L  NAILS  Thick disfigured discolored nails with subungual debris.         Assessment & Plan:  Onychomycosis  B/L  Heloma durum fifth toe right  IE  Debride nails both feet.  Debride corn. RTC 3 months  Helane GuntherGregory Kaycen Whitworth DPM

## 2015-12-30 DIAGNOSIS — I10 Essential (primary) hypertension: Secondary | ICD-10-CM | POA: Diagnosis not present

## 2015-12-30 DIAGNOSIS — E559 Vitamin D deficiency, unspecified: Secondary | ICD-10-CM | POA: Diagnosis not present

## 2016-01-20 DIAGNOSIS — I10 Essential (primary) hypertension: Secondary | ICD-10-CM | POA: Diagnosis not present

## 2016-01-20 DIAGNOSIS — E559 Vitamin D deficiency, unspecified: Secondary | ICD-10-CM | POA: Diagnosis not present

## 2016-02-02 ENCOUNTER — Other Ambulatory Visit: Payer: Self-pay

## 2016-02-02 DIAGNOSIS — Z1231 Encounter for screening mammogram for malignant neoplasm of breast: Secondary | ICD-10-CM

## 2016-02-10 DIAGNOSIS — E559 Vitamin D deficiency, unspecified: Secondary | ICD-10-CM | POA: Diagnosis not present

## 2016-02-10 DIAGNOSIS — I10 Essential (primary) hypertension: Secondary | ICD-10-CM | POA: Diagnosis not present

## 2016-02-17 ENCOUNTER — Ambulatory Visit
Admission: RE | Admit: 2016-02-17 | Discharge: 2016-02-17 | Disposition: A | Discharge status: Home / Self Care | Payer: Medicare HMO | Source: Ambulatory Visit

## 2016-02-17 DIAGNOSIS — Z1231 Encounter for screening mammogram for malignant neoplasm of breast: Secondary | ICD-10-CM

## 2016-02-29 DIAGNOSIS — E559 Vitamin D deficiency, unspecified: Secondary | ICD-10-CM | POA: Diagnosis not present

## 2016-02-29 DIAGNOSIS — M5137 Other intervertebral disc degeneration, lumbosacral region: Secondary | ICD-10-CM | POA: Diagnosis not present

## 2016-02-29 DIAGNOSIS — H3530 Unspecified macular degeneration: Secondary | ICD-10-CM | POA: Diagnosis not present

## 2016-02-29 DIAGNOSIS — M179 Osteoarthritis of knee, unspecified: Secondary | ICD-10-CM | POA: Diagnosis not present

## 2016-02-29 DIAGNOSIS — I2510 Atherosclerotic heart disease of native coronary artery without angina pectoris: Secondary | ICD-10-CM | POA: Diagnosis not present

## 2016-02-29 DIAGNOSIS — E785 Hyperlipidemia, unspecified: Secondary | ICD-10-CM | POA: Diagnosis not present

## 2016-02-29 DIAGNOSIS — E119 Type 2 diabetes mellitus without complications: Secondary | ICD-10-CM | POA: Diagnosis not present

## 2016-02-29 DIAGNOSIS — I10 Essential (primary) hypertension: Secondary | ICD-10-CM | POA: Diagnosis not present

## 2016-02-29 DIAGNOSIS — E1140 Type 2 diabetes mellitus with diabetic neuropathy, unspecified: Secondary | ICD-10-CM | POA: Diagnosis not present

## 2016-03-09 DIAGNOSIS — E559 Vitamin D deficiency, unspecified: Secondary | ICD-10-CM | POA: Diagnosis not present

## 2016-03-09 DIAGNOSIS — I10 Essential (primary) hypertension: Secondary | ICD-10-CM | POA: Diagnosis not present

## 2016-03-31 ENCOUNTER — Ambulatory Visit: Payer: Medicare HMO | Admitting: Podiatry

## 2016-04-06 DIAGNOSIS — H3530 Unspecified macular degeneration: Secondary | ICD-10-CM | POA: Diagnosis not present

## 2016-04-06 DIAGNOSIS — M179 Osteoarthritis of knee, unspecified: Secondary | ICD-10-CM | POA: Diagnosis not present

## 2016-04-06 DIAGNOSIS — M5137 Other intervertebral disc degeneration, lumbosacral region: Secondary | ICD-10-CM | POA: Diagnosis not present

## 2016-04-06 DIAGNOSIS — E785 Hyperlipidemia, unspecified: Secondary | ICD-10-CM | POA: Diagnosis not present

## 2016-04-06 DIAGNOSIS — E1140 Type 2 diabetes mellitus with diabetic neuropathy, unspecified: Secondary | ICD-10-CM | POA: Diagnosis not present

## 2016-04-06 DIAGNOSIS — E119 Type 2 diabetes mellitus without complications: Secondary | ICD-10-CM | POA: Diagnosis not present

## 2016-04-06 DIAGNOSIS — E039 Hypothyroidism, unspecified: Secondary | ICD-10-CM | POA: Diagnosis not present

## 2016-04-06 DIAGNOSIS — I10 Essential (primary) hypertension: Secondary | ICD-10-CM | POA: Diagnosis not present

## 2016-04-06 DIAGNOSIS — I2510 Atherosclerotic heart disease of native coronary artery without angina pectoris: Secondary | ICD-10-CM | POA: Diagnosis not present

## 2016-04-11 DIAGNOSIS — E039 Hypothyroidism, unspecified: Secondary | ICD-10-CM | POA: Diagnosis not present

## 2016-04-11 DIAGNOSIS — E785 Hyperlipidemia, unspecified: Secondary | ICD-10-CM | POA: Diagnosis not present

## 2016-04-11 DIAGNOSIS — E119 Type 2 diabetes mellitus without complications: Secondary | ICD-10-CM | POA: Diagnosis not present

## 2016-04-11 DIAGNOSIS — M545 Low back pain: Secondary | ICD-10-CM | POA: Diagnosis not present

## 2016-04-11 DIAGNOSIS — I10 Essential (primary) hypertension: Secondary | ICD-10-CM | POA: Diagnosis not present

## 2016-04-11 DIAGNOSIS — Z6833 Body mass index (BMI) 33.0-33.9, adult: Secondary | ICD-10-CM | POA: Diagnosis not present

## 2016-04-17 DIAGNOSIS — I10 Essential (primary) hypertension: Secondary | ICD-10-CM | POA: Diagnosis not present

## 2016-04-17 DIAGNOSIS — E559 Vitamin D deficiency, unspecified: Secondary | ICD-10-CM | POA: Diagnosis not present

## 2016-04-28 ENCOUNTER — Ambulatory Visit (INDEPENDENT_AMBULATORY_CARE_PROVIDER_SITE_OTHER): Payer: Commercial Managed Care - HMO | Admitting: Podiatry

## 2016-04-28 ENCOUNTER — Encounter: Payer: Self-pay | Admitting: Podiatry

## 2016-04-28 DIAGNOSIS — M79676 Pain in unspecified toe(s): Secondary | ICD-10-CM

## 2016-04-28 DIAGNOSIS — B351 Tinea unguium: Secondary | ICD-10-CM | POA: Diagnosis not present

## 2016-04-28 DIAGNOSIS — E119 Type 2 diabetes mellitus without complications: Secondary | ICD-10-CM

## 2016-04-28 NOTE — Progress Notes (Signed)
   Subjective:    Patient ID: Jennifer Zhang, female    DOB: 07-Jun-1926, 82 y.o.   MRN: [id number]  HPIThis patient presents to the office today with painful toenails on both feet and painful corn fifth toe right foot.  She says her nails have grown long and thick and are painful walking and wearing her shoes.  .  She presents for preventive foot care services. The patient presents here today with B/L toenail debridement.   Review of Systems  All other systems reviewed and are negative.      Objective:   Physical Exam GENERAL APPEARANCE: Alert, conversant. Appropriately groomed. No acute distress.  VASCULAR: Pedal pulses palpable at  [facility] and PT bilateral.  Capillary refill time is immediate to all digits,  Normal temperature gradient.  Digital hair growth is present bilateral  NEUROLOGIC: sensation is normal to 5.07 monofilament at 5/5 sites bilateral.  Light touch is intact bilateral, Muscle strength normal.  MUSCULOSKELETAL: acceptable muscle strength, tone and stability bilateral.  Intrinsic muscluature intact bilateral.  Rectus appearance of foot and digits noted bilateral.   DERMATOLOGIC: skin color, texture, and turgor are within normal limits.  No preulcerative lesions or ulcers  are seen, no interdigital maceration noted.  No open lesions present.   No drainage noted.  NAILS  Thick disfigured discolored nails with subungual debris.         Assessment & Plan:  Onychomycosis  B/L    IE  Debride nails both feet.   RTC 3 months  Helane Gunther DPM

## 2016-05-16 DIAGNOSIS — E559 Vitamin D deficiency, unspecified: Secondary | ICD-10-CM | POA: Diagnosis not present

## 2016-05-16 DIAGNOSIS — I10 Essential (primary) hypertension: Secondary | ICD-10-CM | POA: Diagnosis not present

## 2016-06-29 DIAGNOSIS — I10 Essential (primary) hypertension: Secondary | ICD-10-CM | POA: Diagnosis not present

## 2016-06-29 DIAGNOSIS — E559 Vitamin D deficiency, unspecified: Secondary | ICD-10-CM | POA: Diagnosis not present

## 2016-07-13 DIAGNOSIS — H3530 Unspecified macular degeneration: Secondary | ICD-10-CM | POA: Diagnosis not present

## 2016-07-13 DIAGNOSIS — I10 Essential (primary) hypertension: Secondary | ICD-10-CM | POA: Diagnosis not present

## 2016-07-13 DIAGNOSIS — E039 Hypothyroidism, unspecified: Secondary | ICD-10-CM | POA: Diagnosis not present

## 2016-07-13 DIAGNOSIS — E785 Hyperlipidemia, unspecified: Secondary | ICD-10-CM | POA: Diagnosis not present

## 2016-07-13 DIAGNOSIS — M179 Osteoarthritis of knee, unspecified: Secondary | ICD-10-CM | POA: Diagnosis not present

## 2016-07-13 DIAGNOSIS — E119 Type 2 diabetes mellitus without complications: Secondary | ICD-10-CM | POA: Diagnosis not present

## 2016-07-13 DIAGNOSIS — I2510 Atherosclerotic heart disease of native coronary artery without angina pectoris: Secondary | ICD-10-CM | POA: Diagnosis not present

## 2016-07-13 DIAGNOSIS — M5137 Other intervertebral disc degeneration, lumbosacral region: Secondary | ICD-10-CM | POA: Diagnosis not present

## 2016-07-13 DIAGNOSIS — E1140 Type 2 diabetes mellitus with diabetic neuropathy, unspecified: Secondary | ICD-10-CM | POA: Diagnosis not present

## 2016-07-27 DIAGNOSIS — I10 Essential (primary) hypertension: Secondary | ICD-10-CM | POA: Diagnosis not present

## 2016-07-27 DIAGNOSIS — E559 Vitamin D deficiency, unspecified: Secondary | ICD-10-CM | POA: Diagnosis not present

## 2016-07-28 ENCOUNTER — Ambulatory Visit (INDEPENDENT_AMBULATORY_CARE_PROVIDER_SITE_OTHER): Payer: Commercial Managed Care - HMO | Admitting: Podiatry

## 2016-07-28 ENCOUNTER — Encounter: Payer: Self-pay | Admitting: Podiatry

## 2016-07-28 DIAGNOSIS — M79676 Pain in unspecified toe(s): Secondary | ICD-10-CM | POA: Diagnosis not present

## 2016-07-28 DIAGNOSIS — E119 Type 2 diabetes mellitus without complications: Secondary | ICD-10-CM | POA: Diagnosis not present

## 2016-07-28 DIAGNOSIS — B351 Tinea unguium: Secondary | ICD-10-CM

## 2016-07-28 NOTE — Progress Notes (Signed)
   Subjective:    Patient ID: Jennifer Zhang, female    DOB: 03/28/1926, 82 y.o.   MRN: [id number]  HPIThis patient presents to the office today with painful toenails on both feet and painful corn fifth toe right foot.  She says her nails have grown long and thick and are painful walking and wearing her shoes.  .  She presents for preventive foot care services. The patient presents here today with B/L toenail debridement.   Review of Systems  All other systems reviewed and are negative.      Objective:   Physical Exam GENERAL APPEARANCE: Alert, conversant. Appropriately groomed. No acute distress.  VASCULAR: Pedal pulses palpable at  DP and PT bilateral.  Capillary refill time is immediate to all digits,  Normal temperature gradient.  Digital hair growth is present bilateral  NEUROLOGIC: sensation is normal to 5.07 monofilament at 5/5 sites bilateral.  Light touch is intact bilateral, Muscle strength normal.  MUSCULOSKELETAL: acceptable muscle strength, tone and stability bilateral.  Intrinsic muscluature intact bilateral.  Rectus appearance of foot and digits noted bilateral.   DERMATOLOGIC: skin color, texture, and turgor are within normal limits.  No preulcerative lesions or ulcers  are seen, no interdigital maceration noted.  No open lesions present.   No drainage noted.  NAILS  Thick disfigured discolored nails with subungual debris.         Assessment & Plan:  Onychomycosis  B/L    IE  Debride nails both feet.   RTC 3 months  Alazia Crocket DPM 

## 2016-08-23 DIAGNOSIS — E559 Vitamin D deficiency, unspecified: Secondary | ICD-10-CM | POA: Diagnosis not present

## 2016-08-23 DIAGNOSIS — I10 Essential (primary) hypertension: Secondary | ICD-10-CM | POA: Diagnosis not present

## 2016-09-05 DIAGNOSIS — E785 Hyperlipidemia, unspecified: Secondary | ICD-10-CM | POA: Diagnosis not present

## 2016-09-05 DIAGNOSIS — M5137 Other intervertebral disc degeneration, lumbosacral region: Secondary | ICD-10-CM | POA: Diagnosis not present

## 2016-09-05 DIAGNOSIS — I10 Essential (primary) hypertension: Secondary | ICD-10-CM | POA: Diagnosis not present

## 2016-09-05 DIAGNOSIS — I2510 Atherosclerotic heart disease of native coronary artery without angina pectoris: Secondary | ICD-10-CM | POA: Diagnosis not present

## 2016-09-05 DIAGNOSIS — E119 Type 2 diabetes mellitus without complications: Secondary | ICD-10-CM | POA: Diagnosis not present

## 2016-09-05 DIAGNOSIS — E1140 Type 2 diabetes mellitus with diabetic neuropathy, unspecified: Secondary | ICD-10-CM | POA: Diagnosis not present

## 2016-09-05 DIAGNOSIS — E039 Hypothyroidism, unspecified: Secondary | ICD-10-CM | POA: Diagnosis not present

## 2016-09-05 DIAGNOSIS — H3530 Unspecified macular degeneration: Secondary | ICD-10-CM | POA: Diagnosis not present

## 2016-09-05 DIAGNOSIS — M179 Osteoarthritis of knee, unspecified: Secondary | ICD-10-CM | POA: Diagnosis not present

## 2016-09-18 DIAGNOSIS — E119 Type 2 diabetes mellitus without complications: Secondary | ICD-10-CM | POA: Diagnosis not present

## 2016-09-18 DIAGNOSIS — E785 Hyperlipidemia, unspecified: Secondary | ICD-10-CM | POA: Diagnosis not present

## 2016-09-18 DIAGNOSIS — I10 Essential (primary) hypertension: Secondary | ICD-10-CM | POA: Diagnosis not present

## 2016-09-18 DIAGNOSIS — I2510 Atherosclerotic heart disease of native coronary artery without angina pectoris: Secondary | ICD-10-CM | POA: Diagnosis not present

## 2016-09-18 DIAGNOSIS — M179 Osteoarthritis of knee, unspecified: Secondary | ICD-10-CM | POA: Diagnosis not present

## 2016-09-18 DIAGNOSIS — E1140 Type 2 diabetes mellitus with diabetic neuropathy, unspecified: Secondary | ICD-10-CM | POA: Diagnosis not present

## 2016-09-18 DIAGNOSIS — Z0000 Encounter for general adult medical examination without abnormal findings: Secondary | ICD-10-CM | POA: Diagnosis not present

## 2016-09-18 DIAGNOSIS — M5137 Other intervertebral disc degeneration, lumbosacral region: Secondary | ICD-10-CM | POA: Diagnosis not present

## 2016-09-18 DIAGNOSIS — E039 Hypothyroidism, unspecified: Secondary | ICD-10-CM | POA: Diagnosis not present

## 2016-09-21 DIAGNOSIS — I10 Essential (primary) hypertension: Secondary | ICD-10-CM | POA: Diagnosis not present

## 2016-09-21 DIAGNOSIS — E559 Vitamin D deficiency, unspecified: Secondary | ICD-10-CM | POA: Diagnosis not present

## 2016-10-27 ENCOUNTER — Ambulatory Visit (INDEPENDENT_AMBULATORY_CARE_PROVIDER_SITE_OTHER): Payer: Medicaid Other | Admitting: Podiatry

## 2016-10-27 ENCOUNTER — Encounter: Payer: Self-pay | Admitting: Podiatry

## 2016-10-27 DIAGNOSIS — B351 Tinea unguium: Secondary | ICD-10-CM | POA: Diagnosis not present

## 2016-10-27 DIAGNOSIS — M79676 Pain in unspecified toe(s): Secondary | ICD-10-CM

## 2016-10-27 DIAGNOSIS — E119 Type 2 diabetes mellitus without complications: Secondary | ICD-10-CM

## 2016-10-27 NOTE — Progress Notes (Signed)
   Subjective:    Patient ID: Jennifer Zhang, female    DOB: 1926-02-14, 83 y.o.   MRN: [id number]  HPIThis patient presents to the office today with painful toenails on both feet and painful corn fifth toe right foot.  She says her nails have grown long and thick and are painful walking and wearing her shoes.  .  She presents for preventive foot care services. The patient presents here today with B/L toenail debridement.   Review of Systems  All other systems reviewed and are negative.      Objective:   Physical Exam GENERAL APPEARANCE: Alert, conversant. Appropriately groomed. No acute distress.  VASC[facility]: [facility]  [facility].  Capillary refill time is immediate to all digits,  Normal temperature gradient.  Digital hair growth is present bilateral  NEUROLOGIC: sensation is normal to 5.07 monofilament at 5/5 sites bilateral.  Light touch is intact bilateral, Muscle strength normal.  MUSCULOSKELETAL: acceptable muscle strength, tone and stability bilateral.  Intrinsic muscluature intact bilateral.  Rectus appearance of foot and digits noted bilateral.   DERMATOLOGIC: skin color, texture, and turgor are within normal limits.  No preulcerative lesions or ulcers  are seen, no interdigital maceration noted.  No open lesions present.   No drainage noted.  NAILS  Thick disfigured discolored nails with subungual debris.         Assessment & Plan:  Onychomycosis  B/L    IE  Debride nails both feet.   RTC 3 months.  Patients says she gets her diabetic shoes elsewhere.  Helane GuntherGregory Korey Prashad DPM

## 2016-10-31 DIAGNOSIS — H538 Other visual disturbances: Secondary | ICD-10-CM | POA: Diagnosis not present

## 2016-10-31 DIAGNOSIS — Z0000 Encounter for general adult medical examination without abnormal findings: Secondary | ICD-10-CM | POA: Diagnosis not present

## 2016-10-31 DIAGNOSIS — I2510 Atherosclerotic heart disease of native coronary artery without angina pectoris: Secondary | ICD-10-CM | POA: Diagnosis not present

## 2016-10-31 DIAGNOSIS — I10 Essential (primary) hypertension: Secondary | ICD-10-CM | POA: Diagnosis not present

## 2016-10-31 DIAGNOSIS — E785 Hyperlipidemia, unspecified: Secondary | ICD-10-CM | POA: Diagnosis not present

## 2016-10-31 DIAGNOSIS — E119 Type 2 diabetes mellitus without complications: Secondary | ICD-10-CM | POA: Diagnosis not present

## 2016-11-23 DIAGNOSIS — E109 Type 1 diabetes mellitus without complications: Secondary | ICD-10-CM | POA: Diagnosis not present

## 2016-11-29 DIAGNOSIS — I2510 Atherosclerotic heart disease of native coronary artery without angina pectoris: Secondary | ICD-10-CM | POA: Diagnosis not present

## 2016-11-29 DIAGNOSIS — E785 Hyperlipidemia, unspecified: Secondary | ICD-10-CM | POA: Diagnosis not present

## 2016-11-29 DIAGNOSIS — E119 Type 2 diabetes mellitus without complications: Secondary | ICD-10-CM | POA: Diagnosis not present

## 2016-11-29 DIAGNOSIS — H6122 Impacted cerumen, left ear: Secondary | ICD-10-CM | POA: Diagnosis not present

## 2016-11-29 DIAGNOSIS — E559 Vitamin D deficiency, unspecified: Secondary | ICD-10-CM | POA: Diagnosis not present

## 2016-11-29 DIAGNOSIS — I10 Essential (primary) hypertension: Secondary | ICD-10-CM | POA: Diagnosis not present

## 2016-11-29 DIAGNOSIS — E1140 Type 2 diabetes mellitus with diabetic neuropathy, unspecified: Secondary | ICD-10-CM | POA: Diagnosis not present

## 2017-01-26 ENCOUNTER — Ambulatory Visit: Payer: Medicaid Other | Admitting: Podiatry

## 2017-02-12 ENCOUNTER — Other Ambulatory Visit: Payer: Self-pay | Admitting: Internal Medicine

## 2017-02-12 DIAGNOSIS — Z1231 Encounter for screening mammogram for malignant neoplasm of breast: Secondary | ICD-10-CM

## 2017-02-23 ENCOUNTER — Ambulatory Visit (INDEPENDENT_AMBULATORY_CARE_PROVIDER_SITE_OTHER): Payer: Medicare HMO | Admitting: Podiatry

## 2017-02-23 ENCOUNTER — Encounter: Payer: Self-pay | Admitting: Podiatry

## 2017-02-23 DIAGNOSIS — M79676 Pain in unspecified toe(s): Secondary | ICD-10-CM | POA: Diagnosis not present

## 2017-02-23 DIAGNOSIS — B351 Tinea unguium: Secondary | ICD-10-CM | POA: Diagnosis not present

## 2017-02-23 DIAGNOSIS — E119 Type 2 diabetes mellitus without complications: Secondary | ICD-10-CM

## 2017-02-23 NOTE — Progress Notes (Signed)
   Subjective:    Patient ID: Jennifer Zhang, female    DOB: [birth year], 83 y.o.   MRN: [id number]  HPIThis patient presents to the office today with painful toenails on both feet .  She says her nails have grown long and thick and are painful walking and wearing her shoes.  .  She presents for preventive foot care services. The patient presents here today with B/L toenail debridement.   Review of Systems  All other systems reviewed and are negative.      Objective:   Physical Exam GENERAL APPEARANCE: Alert, conversant. Appropriately groomed. No acute distress.  VASCULAR: Pedal [facility] palpable [facility]  [facility].  Capillary refill time is immediate to all digits,  Normal temperature gradient.   NEUROLOGIC: sensation is normal to 5.07 monofilament at 5/5 sites bilateral.  Light touch is intact bilateral, Muscle strength normal.  MUSCULOSKELETAL: acceptable muscle strength, tone and stability bilateral.  Intrinsic muscluature intact bilateral.  . HAV  B/L  Pes planus.  DERMATOLOGIC: skin color, texture, and turgor are within normal limits.  No preulcerative lesions or ulcers  are seen, no interdigital maceration noted.  No open lesions present.   No drainage noted.  NAILS  Thick disfigured discolored nails with subungual debris.         Assessment & Plan:  Onychomycosis  B/L    IE  Debride nails both feet.   RTC 3 months.  Patients says she gets her diabetic shoes elsewhere.  Helane GuntherGregory Ashla Murph DPM

## 2017-02-28 DIAGNOSIS — M179 Osteoarthritis of knee, unspecified: Secondary | ICD-10-CM | POA: Diagnosis not present

## 2017-02-28 DIAGNOSIS — I2510 Atherosclerotic heart disease of native coronary artery without angina pectoris: Secondary | ICD-10-CM | POA: Diagnosis not present

## 2017-02-28 DIAGNOSIS — M5137 Other intervertebral disc degeneration, lumbosacral region: Secondary | ICD-10-CM | POA: Diagnosis not present

## 2017-02-28 DIAGNOSIS — E1140 Type 2 diabetes mellitus with diabetic neuropathy, unspecified: Secondary | ICD-10-CM | POA: Diagnosis not present

## 2017-02-28 DIAGNOSIS — H3530 Unspecified macular degeneration: Secondary | ICD-10-CM | POA: Diagnosis not present

## 2017-02-28 DIAGNOSIS — I10 Essential (primary) hypertension: Secondary | ICD-10-CM | POA: Diagnosis not present

## 2017-02-28 DIAGNOSIS — E785 Hyperlipidemia, unspecified: Secondary | ICD-10-CM | POA: Diagnosis not present

## 2017-02-28 DIAGNOSIS — E119 Type 2 diabetes mellitus without complications: Secondary | ICD-10-CM | POA: Diagnosis not present

## 2017-02-28 DIAGNOSIS — E039 Hypothyroidism, unspecified: Secondary | ICD-10-CM | POA: Diagnosis not present

## 2017-03-07 ENCOUNTER — Ambulatory Visit: Payer: Medicare HMO

## 2017-03-28 ENCOUNTER — Ambulatory Visit
Admission: RE | Admit: 2017-03-28 | Discharge: 2017-03-28 | Disposition: A | Discharge status: Home / Self Care | Payer: Medicare HMO | Source: Ambulatory Visit | Attending: Internal Medicine | Admitting: Internal Medicine

## 2017-03-28 DIAGNOSIS — Z1231 Encounter for screening mammogram for malignant neoplasm of breast: Secondary | ICD-10-CM

## 2017-04-11 DIAGNOSIS — H3530 Unspecified macular degeneration: Secondary | ICD-10-CM | POA: Diagnosis not present

## 2017-04-11 DIAGNOSIS — E039 Hypothyroidism, unspecified: Secondary | ICD-10-CM | POA: Diagnosis not present

## 2017-04-11 DIAGNOSIS — E1140 Type 2 diabetes mellitus with diabetic neuropathy, unspecified: Secondary | ICD-10-CM | POA: Diagnosis not present

## 2017-04-11 DIAGNOSIS — E119 Type 2 diabetes mellitus without complications: Secondary | ICD-10-CM | POA: Diagnosis not present

## 2017-04-11 DIAGNOSIS — I2510 Atherosclerotic heart disease of native coronary artery without angina pectoris: Secondary | ICD-10-CM | POA: Diagnosis not present

## 2017-04-11 DIAGNOSIS — I10 Essential (primary) hypertension: Secondary | ICD-10-CM | POA: Diagnosis not present

## 2017-04-11 DIAGNOSIS — E559 Vitamin D deficiency, unspecified: Secondary | ICD-10-CM | POA: Diagnosis not present

## 2017-04-11 DIAGNOSIS — E785 Hyperlipidemia, unspecified: Secondary | ICD-10-CM | POA: Diagnosis not present

## 2017-04-11 DIAGNOSIS — M179 Osteoarthritis of knee, unspecified: Secondary | ICD-10-CM | POA: Diagnosis not present

## 2017-04-11 DIAGNOSIS — M5137 Other intervertebral disc degeneration, lumbosacral region: Secondary | ICD-10-CM | POA: Diagnosis not present

## 2017-05-25 ENCOUNTER — Ambulatory Visit: Payer: Medicare HMO | Admitting: Podiatry

## 2017-06-13 ENCOUNTER — Ambulatory Visit: Payer: Medicare HMO | Admitting: Podiatry

## 2017-06-22 ENCOUNTER — Ambulatory Visit (INDEPENDENT_AMBULATORY_CARE_PROVIDER_SITE_OTHER): Payer: Medicare HMO | Admitting: Podiatry

## 2017-06-22 ENCOUNTER — Encounter: Payer: Self-pay | Admitting: Podiatry

## 2017-06-22 DIAGNOSIS — M79676 Pain in unspecified toe(s): Secondary | ICD-10-CM

## 2017-06-22 DIAGNOSIS — E119 Type 2 diabetes mellitus without complications: Secondary | ICD-10-CM

## 2017-06-22 DIAGNOSIS — B351 Tinea unguium: Secondary | ICD-10-CM | POA: Diagnosis not present

## 2017-06-22 NOTE — Progress Notes (Signed)
   Subjective:    Patient ID: Jennifer Zhang, female    DOB: 12-Feb-1926, 84 y.o.   MRN: [id number]  HPIThis patient presents to the office today with painful toenails on both feet .  She says her nails have grown long and thick and are painful walking and wearing her shoes.  .  She presents for preventive foot care services. The patient presents here today with B/L toenail debridement.   Review of Systems  All other systems reviewed and are negative.      Objective:   Physical Exam GENERAL APPEARANCE: Alert, conversant. Appropriately groomed. No acute distress.  VASCULAR: Pedal pulses palpable at  [facility] and PT bilateral.  Capillary refill time is immediate to all digits,  Normal temperature gradient.   NEUROLOGIC: sensation is normal to 5.07 monofilament at 5/5 sites bilateral.  Light touch is intact bilateral, Muscle strength normal.  MUSCULOSKELETAL: acceptable muscle strength, tone and stability bilateral.  Intrinsic muscluature intact bilateral.  . HAV  B/L  Pes planus.  DERMATOLOGIC: skin color, texture, and turgor are within normal limits.  No preulcerative lesions or ulcers  are seen, no interdigital maceration noted.  No open lesions present.   No drainage noted.  NAILS  Thick disfigured discolored nails with subungual debris.         Assessment & Plan:  Onychomycosis  B/L    IE  Debride nails both feet.   RTC 3 months.    Helane Gunther DPM

## 2017-07-11 DIAGNOSIS — E1140 Type 2 diabetes mellitus with diabetic neuropathy, unspecified: Secondary | ICD-10-CM | POA: Diagnosis not present

## 2017-07-11 DIAGNOSIS — E559 Vitamin D deficiency, unspecified: Secondary | ICD-10-CM | POA: Diagnosis not present

## 2017-07-11 DIAGNOSIS — I10 Essential (primary) hypertension: Secondary | ICD-10-CM | POA: Diagnosis not present

## 2017-07-11 DIAGNOSIS — M179 Osteoarthritis of knee, unspecified: Secondary | ICD-10-CM | POA: Diagnosis not present

## 2017-07-11 DIAGNOSIS — E039 Hypothyroidism, unspecified: Secondary | ICD-10-CM | POA: Diagnosis not present

## 2017-07-11 DIAGNOSIS — I2510 Atherosclerotic heart disease of native coronary artery without angina pectoris: Secondary | ICD-10-CM | POA: Diagnosis not present

## 2017-07-11 DIAGNOSIS — E119 Type 2 diabetes mellitus without complications: Secondary | ICD-10-CM | POA: Diagnosis not present

## 2017-07-11 DIAGNOSIS — E785 Hyperlipidemia, unspecified: Secondary | ICD-10-CM | POA: Diagnosis not present

## 2017-07-11 DIAGNOSIS — M5137 Other intervertebral disc degeneration, lumbosacral region: Secondary | ICD-10-CM | POA: Diagnosis not present

## 2017-07-11 DIAGNOSIS — H3530 Unspecified macular degeneration: Secondary | ICD-10-CM | POA: Diagnosis not present

## 2017-09-18 DIAGNOSIS — Z0000 Encounter for general adult medical examination without abnormal findings: Secondary | ICD-10-CM | POA: Diagnosis not present

## 2017-09-18 DIAGNOSIS — I10 Essential (primary) hypertension: Secondary | ICD-10-CM | POA: Diagnosis not present

## 2017-09-18 DIAGNOSIS — E039 Hypothyroidism, unspecified: Secondary | ICD-10-CM | POA: Diagnosis not present

## 2017-09-18 DIAGNOSIS — E785 Hyperlipidemia, unspecified: Secondary | ICD-10-CM | POA: Diagnosis not present

## 2017-09-18 DIAGNOSIS — Z23 Encounter for immunization: Secondary | ICD-10-CM | POA: Diagnosis not present

## 2017-09-18 DIAGNOSIS — E119 Type 2 diabetes mellitus without complications: Secondary | ICD-10-CM | POA: Diagnosis not present

## 2017-09-18 DIAGNOSIS — Z01118 Encounter for examination of ears and hearing with other abnormal findings: Secondary | ICD-10-CM | POA: Diagnosis not present

## 2017-09-18 DIAGNOSIS — E1140 Type 2 diabetes mellitus with diabetic neuropathy, unspecified: Secondary | ICD-10-CM | POA: Diagnosis not present

## 2017-09-18 DIAGNOSIS — H3530 Unspecified macular degeneration: Secondary | ICD-10-CM | POA: Diagnosis not present

## 2017-09-21 ENCOUNTER — Ambulatory Visit: Payer: Medicare HMO | Admitting: Podiatry

## 2017-10-26 ENCOUNTER — Encounter: Payer: Self-pay | Admitting: Podiatry

## 2017-10-26 ENCOUNTER — Ambulatory Visit (INDEPENDENT_AMBULATORY_CARE_PROVIDER_SITE_OTHER): Payer: Medicare HMO | Admitting: Podiatry

## 2017-10-26 DIAGNOSIS — B351 Tinea unguium: Secondary | ICD-10-CM | POA: Diagnosis not present

## 2017-10-26 DIAGNOSIS — E119 Type 2 diabetes mellitus without complications: Secondary | ICD-10-CM | POA: Diagnosis not present

## 2017-10-26 DIAGNOSIS — M79676 Pain in unspecified toe(s): Secondary | ICD-10-CM | POA: Diagnosis not present

## 2017-10-26 NOTE — Progress Notes (Signed)
   Subjective:    Patient ID: Jennifer MaisMartha P Zhang, female    DOB: 10-21-1925, 84 y.o.   MRN: [id number]  HPIThis patient presents to the office today with painful toenails on both feet especially her second toe left foot..  She says her nails have grown long and thick and are painful walking and wearing her shoes.  .  She presents for preventive foot care services. The patient presents here today with B/L toenail debridement. Patient returns after 4 months.   Review of Systems  All other systems reviewed and are negative.      Objective:   Physical Exam GENERAL APPEARANCE: Alert, conversant. Appropriately groomed. No acute distress.  VASCULAR: Pedal pulses palpable at  [facility] bilateral.  Capillary refill time is immediate to all digits,  Normal temperature gradient.  Digital hair growth is present bilateral  NEUROLOGIC: sensation is normal to 5.07 monofilament at 5/5 sites bilateral.  Light touch is intact bilateral, Muscle strength normal.  MUSCULOSKELETAL: acceptable muscle strength, tone and stability bilateral.  Intrinsic muscluature intact bilateral.  Rectus appearance of foot and digits noted bilateral.   DERMATOLOGIC: skin color, texture, and turgor are within normal limits.  No preulcerative lesions or ulcers  are seen, no interdigital maceration noted.  No open lesions present.   No drainage noted.  NAILS  Thick disfigured discolored nails with subungual debris 1,2,3  B/L.         Assessment & Plan:  Onychomycosis  B/L    IE  Debride nails both feet.   RTC 3 months.  Patient requests that I stop using the dremel tool on her nails after working on fourth toe left foot.  Helane GuntherGregory Vincenta Steffey DPM

## 2017-11-27 DIAGNOSIS — E119 Type 2 diabetes mellitus without complications: Secondary | ICD-10-CM | POA: Diagnosis not present

## 2017-11-27 DIAGNOSIS — H524 Presbyopia: Secondary | ICD-10-CM | POA: Diagnosis not present

## 2018-01-17 DIAGNOSIS — E785 Hyperlipidemia, unspecified: Secondary | ICD-10-CM | POA: Diagnosis not present

## 2018-01-17 DIAGNOSIS — Z0000 Encounter for general adult medical examination without abnormal findings: Secondary | ICD-10-CM | POA: Diagnosis not present

## 2018-01-17 DIAGNOSIS — Z01118 Encounter for examination of ears and hearing with other abnormal findings: Secondary | ICD-10-CM | POA: Diagnosis not present

## 2018-01-17 DIAGNOSIS — M5137 Other intervertebral disc degeneration, lumbosacral region: Secondary | ICD-10-CM | POA: Diagnosis not present

## 2018-01-17 DIAGNOSIS — M179 Osteoarthritis of knee, unspecified: Secondary | ICD-10-CM | POA: Diagnosis not present

## 2018-01-17 DIAGNOSIS — H538 Other visual disturbances: Secondary | ICD-10-CM | POA: Diagnosis not present

## 2018-01-17 DIAGNOSIS — E119 Type 2 diabetes mellitus without complications: Secondary | ICD-10-CM | POA: Diagnosis not present

## 2018-01-17 DIAGNOSIS — E039 Hypothyroidism, unspecified: Secondary | ICD-10-CM | POA: Diagnosis not present

## 2018-01-17 DIAGNOSIS — E1140 Type 2 diabetes mellitus with diabetic neuropathy, unspecified: Secondary | ICD-10-CM | POA: Diagnosis not present

## 2018-01-17 DIAGNOSIS — I2510 Atherosclerotic heart disease of native coronary artery without angina pectoris: Secondary | ICD-10-CM | POA: Diagnosis not present

## 2018-01-17 DIAGNOSIS — I10 Essential (primary) hypertension: Secondary | ICD-10-CM | POA: Diagnosis not present

## 2018-01-18 ENCOUNTER — Ambulatory Visit: Payer: Medicare HMO | Admitting: Podiatry

## 2018-01-23 ENCOUNTER — Encounter: Payer: Self-pay | Admitting: Podiatry

## 2018-01-23 ENCOUNTER — Ambulatory Visit (INDEPENDENT_AMBULATORY_CARE_PROVIDER_SITE_OTHER): Payer: Medicare HMO | Admitting: Podiatry

## 2018-01-23 DIAGNOSIS — B351 Tinea unguium: Secondary | ICD-10-CM

## 2018-01-23 DIAGNOSIS — E119 Type 2 diabetes mellitus without complications: Secondary | ICD-10-CM

## 2018-01-23 DIAGNOSIS — M79676 Pain in unspecified toe(s): Secondary | ICD-10-CM

## 2018-01-23 NOTE — Progress Notes (Signed)
Complaint:  Visit Type: Patient returns to my office for continued preventative foot care services. Complaint: Patient states" my nails have grown long and thick and become painful to walk and wear shoes" Patient has been diagnosed with DM with no foot complications. The patient presents for preventative foot care services. No changes to ROS.  Patient requests second toes both feet not be done as well as not using the dremel tool in treatment of her nails.  Podiatric Exam: Vascular: dorsalis pedis and posterior tibial pulses are palpable bilateral. Capillary return is immediate. Temperature gradient is WNL. Skin turgor WNL  Sensorium: Normal Semmes Weinstein monofilament test. Normal tactile sensation bilaterally. Nail Exam: Pt has thick disfigured discolored nails with subungual debris noted 1,2,3  B/L Ulcer Exam: There is no evidence of ulcer or pre-ulcerative changes or infection. Orthopedic Exam: Muscle tone and strength are WNL. No limitations in general ROM. No crepitus or effusions noted. Foot type and digits show no abnormalities. Bony prominences are unremarkable. Skin: No Porokeratosis. No infection or ulcers  Diagnosis:  Onychomycosis, , Pain in right toe, pain in left toes  Treatment & Plan Procedures and Treatment: Consent by patient was obtained for treatment procedures.   Debridement of mycotic and hypertrophic toenails, 1 through 5 bilateral and clearing of subungual debris. No ulceration, no infection noted.  Return Visit-Office Procedure: Patient instructed to return to the office for a follow up visit 3 months for continued evaluation and treatment.    Helane GuntherGregory Dedria Endres DPM

## 2018-03-07 DIAGNOSIS — M179 Osteoarthritis of knee, unspecified: Secondary | ICD-10-CM | POA: Diagnosis not present

## 2018-03-07 DIAGNOSIS — E039 Hypothyroidism, unspecified: Secondary | ICD-10-CM | POA: Diagnosis not present

## 2018-03-07 DIAGNOSIS — M5137 Other intervertebral disc degeneration, lumbosacral region: Secondary | ICD-10-CM | POA: Diagnosis not present

## 2018-03-07 DIAGNOSIS — L308 Other specified dermatitis: Secondary | ICD-10-CM | POA: Diagnosis not present

## 2018-03-07 DIAGNOSIS — I10 Essential (primary) hypertension: Secondary | ICD-10-CM | POA: Diagnosis not present

## 2018-03-07 DIAGNOSIS — I2510 Atherosclerotic heart disease of native coronary artery without angina pectoris: Secondary | ICD-10-CM | POA: Diagnosis not present

## 2018-03-07 DIAGNOSIS — E1140 Type 2 diabetes mellitus with diabetic neuropathy, unspecified: Secondary | ICD-10-CM | POA: Diagnosis not present

## 2018-03-07 DIAGNOSIS — E785 Hyperlipidemia, unspecified: Secondary | ICD-10-CM | POA: Diagnosis not present

## 2018-03-07 DIAGNOSIS — E119 Type 2 diabetes mellitus without complications: Secondary | ICD-10-CM | POA: Diagnosis not present

## 2018-04-12 ENCOUNTER — Ambulatory Visit: Payer: Medicare HMO | Admitting: Podiatry

## 2018-04-19 ENCOUNTER — Ambulatory Visit (INDEPENDENT_AMBULATORY_CARE_PROVIDER_SITE_OTHER): Payer: Medicare HMO | Admitting: Podiatry

## 2018-04-19 ENCOUNTER — Encounter: Payer: Self-pay | Admitting: Podiatry

## 2018-04-19 DIAGNOSIS — B351 Tinea unguium: Secondary | ICD-10-CM | POA: Diagnosis not present

## 2018-04-19 DIAGNOSIS — M79676 Pain in unspecified toe(s): Secondary | ICD-10-CM

## 2018-04-19 DIAGNOSIS — E119 Type 2 diabetes mellitus without complications: Secondary | ICD-10-CM | POA: Diagnosis not present

## 2018-04-19 NOTE — Progress Notes (Signed)
Complaint:  Visit Type: Patient returns to my office for continued preventative foot care services. Complaint: Patient states" my nails have grown long and thick and become painful to walk and wear shoes" Patient has been diagnosed with DM with no foot complications. The patient presents for preventative foot care services. No changes to ROS.  Patient requests second toes both feet not be done as well as not using the dremel tool in treatment of her nails.  Podiatric Exam: Vascular: dorsalis pedis and posterior tibial pulses are palpable bilateral. Capillary return is immediate. Temperature gradient is WNL. Skin turgor WNL  Sensorium: Normal Semmes Weinstein monofilament test. Normal tactile sensation bilaterally. Nail Exam: Pt has thick disfigured discolored nails with subungual debris noted 1,2,3  B/L Ulcer Exam: There is no evidence of ulcer or pre-ulcerative changes or infection. Orthopedic Exam: Muscle tone and strength are WNL. No limitations in general ROM. No crepitus or effusions noted. Foot type and digits show no abnormalities. Bony prominences are unremarkable. Skin: No Porokeratosis. No infection or ulcers  Diagnosis:  Onychomycosis, , Pain in right toe, pain in left toes  Treatment & Plan Procedures and Treatment: Consent by patient was obtained for treatment procedures.   Debridement of mycotic and hypertrophic toenails, 1 through 5 bilateral and clearing of subungual debris. No ulceration, no infection noted.  Return Visit-Office Procedure: Patient instructed to return to the office for a follow up visit 3 months for continued evaluation and treatment.    Helane GuntherGregory Cleopha Indelicato DPM

## 2018-05-07 ENCOUNTER — Other Ambulatory Visit: Payer: Self-pay | Admitting: Internal Medicine

## 2018-05-07 DIAGNOSIS — Z1231 Encounter for screening mammogram for malignant neoplasm of breast: Secondary | ICD-10-CM

## 2018-05-09 DIAGNOSIS — I10 Essential (primary) hypertension: Secondary | ICD-10-CM | POA: Diagnosis not present

## 2018-05-09 DIAGNOSIS — L308 Other specified dermatitis: Secondary | ICD-10-CM | POA: Diagnosis not present

## 2018-05-09 DIAGNOSIS — M179 Osteoarthritis of knee, unspecified: Secondary | ICD-10-CM | POA: Diagnosis not present

## 2018-05-09 DIAGNOSIS — E785 Hyperlipidemia, unspecified: Secondary | ICD-10-CM | POA: Diagnosis not present

## 2018-05-09 DIAGNOSIS — E119 Type 2 diabetes mellitus without complications: Secondary | ICD-10-CM | POA: Diagnosis not present

## 2018-05-09 DIAGNOSIS — E1140 Type 2 diabetes mellitus with diabetic neuropathy, unspecified: Secondary | ICD-10-CM | POA: Diagnosis not present

## 2018-05-09 DIAGNOSIS — I2510 Atherosclerotic heart disease of native coronary artery without angina pectoris: Secondary | ICD-10-CM | POA: Diagnosis not present

## 2018-05-09 DIAGNOSIS — E039 Hypothyroidism, unspecified: Secondary | ICD-10-CM | POA: Diagnosis not present

## 2018-05-09 DIAGNOSIS — M5137 Other intervertebral disc degeneration, lumbosacral region: Secondary | ICD-10-CM | POA: Diagnosis not present

## 2018-06-04 DIAGNOSIS — E785 Hyperlipidemia, unspecified: Secondary | ICD-10-CM | POA: Diagnosis not present

## 2018-06-04 DIAGNOSIS — L308 Other specified dermatitis: Secondary | ICD-10-CM | POA: Diagnosis not present

## 2018-06-04 DIAGNOSIS — E039 Hypothyroidism, unspecified: Secondary | ICD-10-CM | POA: Diagnosis not present

## 2018-06-04 DIAGNOSIS — E119 Type 2 diabetes mellitus without complications: Secondary | ICD-10-CM | POA: Diagnosis not present

## 2018-06-04 DIAGNOSIS — M179 Osteoarthritis of knee, unspecified: Secondary | ICD-10-CM | POA: Diagnosis not present

## 2018-06-04 DIAGNOSIS — I2510 Atherosclerotic heart disease of native coronary artery without angina pectoris: Secondary | ICD-10-CM | POA: Diagnosis not present

## 2018-06-04 DIAGNOSIS — E1140 Type 2 diabetes mellitus with diabetic neuropathy, unspecified: Secondary | ICD-10-CM | POA: Diagnosis not present

## 2018-06-04 DIAGNOSIS — I10 Essential (primary) hypertension: Secondary | ICD-10-CM | POA: Diagnosis not present

## 2018-06-04 DIAGNOSIS — M5137 Other intervertebral disc degeneration, lumbosacral region: Secondary | ICD-10-CM | POA: Diagnosis not present

## 2018-06-06 DIAGNOSIS — E1140 Type 2 diabetes mellitus with diabetic neuropathy, unspecified: Secondary | ICD-10-CM | POA: Diagnosis not present

## 2018-06-06 DIAGNOSIS — M1711 Unilateral primary osteoarthritis, right knee: Secondary | ICD-10-CM | POA: Diagnosis not present

## 2018-06-06 DIAGNOSIS — I2510 Atherosclerotic heart disease of native coronary artery without angina pectoris: Secondary | ICD-10-CM | POA: Diagnosis not present

## 2018-06-06 DIAGNOSIS — I119 Hypertensive heart disease without heart failure: Secondary | ICD-10-CM | POA: Diagnosis not present

## 2018-06-06 DIAGNOSIS — M5137 Other intervertebral disc degeneration, lumbosacral region: Secondary | ICD-10-CM | POA: Diagnosis not present

## 2018-06-06 DIAGNOSIS — L308 Other specified dermatitis: Secondary | ICD-10-CM | POA: Diagnosis not present

## 2018-06-07 ENCOUNTER — Ambulatory Visit
Admission: RE | Admit: 2018-06-07 | Discharge: 2018-06-07 | Disposition: A | Discharge status: Home / Self Care | Payer: Medicare HMO | Source: Ambulatory Visit | Attending: Internal Medicine | Admitting: Internal Medicine

## 2018-06-07 DIAGNOSIS — Z1231 Encounter for screening mammogram for malignant neoplasm of breast: Secondary | ICD-10-CM

## 2018-06-10 DIAGNOSIS — I2510 Atherosclerotic heart disease of native coronary artery without angina pectoris: Secondary | ICD-10-CM | POA: Diagnosis not present

## 2018-06-10 DIAGNOSIS — M5137 Other intervertebral disc degeneration, lumbosacral region: Secondary | ICD-10-CM | POA: Diagnosis not present

## 2018-06-10 DIAGNOSIS — E1140 Type 2 diabetes mellitus with diabetic neuropathy, unspecified: Secondary | ICD-10-CM | POA: Diagnosis not present

## 2018-06-10 DIAGNOSIS — I119 Hypertensive heart disease without heart failure: Secondary | ICD-10-CM | POA: Diagnosis not present

## 2018-06-10 DIAGNOSIS — M1711 Unilateral primary osteoarthritis, right knee: Secondary | ICD-10-CM | POA: Diagnosis not present

## 2018-06-10 DIAGNOSIS — L308 Other specified dermatitis: Secondary | ICD-10-CM | POA: Diagnosis not present

## 2018-06-11 DIAGNOSIS — I2510 Atherosclerotic heart disease of native coronary artery without angina pectoris: Secondary | ICD-10-CM | POA: Diagnosis not present

## 2018-06-11 DIAGNOSIS — I119 Hypertensive heart disease without heart failure: Secondary | ICD-10-CM | POA: Diagnosis not present

## 2018-06-11 DIAGNOSIS — E1140 Type 2 diabetes mellitus with diabetic neuropathy, unspecified: Secondary | ICD-10-CM | POA: Diagnosis not present

## 2018-06-11 DIAGNOSIS — M1711 Unilateral primary osteoarthritis, right knee: Secondary | ICD-10-CM | POA: Diagnosis not present

## 2018-06-11 DIAGNOSIS — L308 Other specified dermatitis: Secondary | ICD-10-CM | POA: Diagnosis not present

## 2018-06-11 DIAGNOSIS — M5137 Other intervertebral disc degeneration, lumbosacral region: Secondary | ICD-10-CM | POA: Diagnosis not present

## 2018-06-17 DIAGNOSIS — I119 Hypertensive heart disease without heart failure: Secondary | ICD-10-CM | POA: Diagnosis not present

## 2018-06-17 DIAGNOSIS — M1711 Unilateral primary osteoarthritis, right knee: Secondary | ICD-10-CM | POA: Diagnosis not present

## 2018-06-17 DIAGNOSIS — I2510 Atherosclerotic heart disease of native coronary artery without angina pectoris: Secondary | ICD-10-CM | POA: Diagnosis not present

## 2018-06-17 DIAGNOSIS — L308 Other specified dermatitis: Secondary | ICD-10-CM | POA: Diagnosis not present

## 2018-06-17 DIAGNOSIS — E1140 Type 2 diabetes mellitus with diabetic neuropathy, unspecified: Secondary | ICD-10-CM | POA: Diagnosis not present

## 2018-06-17 DIAGNOSIS — M5137 Other intervertebral disc degeneration, lumbosacral region: Secondary | ICD-10-CM | POA: Diagnosis not present

## 2018-06-18 DIAGNOSIS — M1711 Unilateral primary osteoarthritis, right knee: Secondary | ICD-10-CM | POA: Diagnosis not present

## 2018-06-18 DIAGNOSIS — I119 Hypertensive heart disease without heart failure: Secondary | ICD-10-CM | POA: Diagnosis not present

## 2018-06-18 DIAGNOSIS — M5137 Other intervertebral disc degeneration, lumbosacral region: Secondary | ICD-10-CM | POA: Diagnosis not present

## 2018-06-18 DIAGNOSIS — I2510 Atherosclerotic heart disease of native coronary artery without angina pectoris: Secondary | ICD-10-CM | POA: Diagnosis not present

## 2018-06-18 DIAGNOSIS — L308 Other specified dermatitis: Secondary | ICD-10-CM | POA: Diagnosis not present

## 2018-06-18 DIAGNOSIS — E1140 Type 2 diabetes mellitus with diabetic neuropathy, unspecified: Secondary | ICD-10-CM | POA: Diagnosis not present

## 2018-06-19 DIAGNOSIS — M5137 Other intervertebral disc degeneration, lumbosacral region: Secondary | ICD-10-CM | POA: Diagnosis not present

## 2018-06-19 DIAGNOSIS — I119 Hypertensive heart disease without heart failure: Secondary | ICD-10-CM | POA: Diagnosis not present

## 2018-06-19 DIAGNOSIS — L308 Other specified dermatitis: Secondary | ICD-10-CM | POA: Diagnosis not present

## 2018-06-19 DIAGNOSIS — I2510 Atherosclerotic heart disease of native coronary artery without angina pectoris: Secondary | ICD-10-CM | POA: Diagnosis not present

## 2018-06-19 DIAGNOSIS — E1140 Type 2 diabetes mellitus with diabetic neuropathy, unspecified: Secondary | ICD-10-CM | POA: Diagnosis not present

## 2018-06-19 DIAGNOSIS — M1711 Unilateral primary osteoarthritis, right knee: Secondary | ICD-10-CM | POA: Diagnosis not present

## 2018-06-20 DIAGNOSIS — L308 Other specified dermatitis: Secondary | ICD-10-CM | POA: Diagnosis not present

## 2018-06-20 DIAGNOSIS — M5137 Other intervertebral disc degeneration, lumbosacral region: Secondary | ICD-10-CM | POA: Diagnosis not present

## 2018-06-20 DIAGNOSIS — M1711 Unilateral primary osteoarthritis, right knee: Secondary | ICD-10-CM | POA: Diagnosis not present

## 2018-06-20 DIAGNOSIS — I119 Hypertensive heart disease without heart failure: Secondary | ICD-10-CM | POA: Diagnosis not present

## 2018-06-20 DIAGNOSIS — E1140 Type 2 diabetes mellitus with diabetic neuropathy, unspecified: Secondary | ICD-10-CM | POA: Diagnosis not present

## 2018-06-20 DIAGNOSIS — I2510 Atherosclerotic heart disease of native coronary artery without angina pectoris: Secondary | ICD-10-CM | POA: Diagnosis not present

## 2018-06-24 DIAGNOSIS — I119 Hypertensive heart disease without heart failure: Secondary | ICD-10-CM | POA: Diagnosis not present

## 2018-06-24 DIAGNOSIS — E1140 Type 2 diabetes mellitus with diabetic neuropathy, unspecified: Secondary | ICD-10-CM | POA: Diagnosis not present

## 2018-06-24 DIAGNOSIS — M5137 Other intervertebral disc degeneration, lumbosacral region: Secondary | ICD-10-CM | POA: Diagnosis not present

## 2018-06-24 DIAGNOSIS — M1711 Unilateral primary osteoarthritis, right knee: Secondary | ICD-10-CM | POA: Diagnosis not present

## 2018-06-24 DIAGNOSIS — I2510 Atherosclerotic heart disease of native coronary artery without angina pectoris: Secondary | ICD-10-CM | POA: Diagnosis not present

## 2018-06-24 DIAGNOSIS — L308 Other specified dermatitis: Secondary | ICD-10-CM | POA: Diagnosis not present

## 2018-06-25 DIAGNOSIS — I119 Hypertensive heart disease without heart failure: Secondary | ICD-10-CM | POA: Diagnosis not present

## 2018-06-25 DIAGNOSIS — E1140 Type 2 diabetes mellitus with diabetic neuropathy, unspecified: Secondary | ICD-10-CM | POA: Diagnosis not present

## 2018-06-25 DIAGNOSIS — L308 Other specified dermatitis: Secondary | ICD-10-CM | POA: Diagnosis not present

## 2018-06-25 DIAGNOSIS — M5137 Other intervertebral disc degeneration, lumbosacral region: Secondary | ICD-10-CM | POA: Diagnosis not present

## 2018-06-25 DIAGNOSIS — M1711 Unilateral primary osteoarthritis, right knee: Secondary | ICD-10-CM | POA: Diagnosis not present

## 2018-06-25 DIAGNOSIS — I2510 Atherosclerotic heart disease of native coronary artery without angina pectoris: Secondary | ICD-10-CM | POA: Diagnosis not present

## 2018-06-26 DIAGNOSIS — I119 Hypertensive heart disease without heart failure: Secondary | ICD-10-CM | POA: Diagnosis not present

## 2018-06-26 DIAGNOSIS — E1140 Type 2 diabetes mellitus with diabetic neuropathy, unspecified: Secondary | ICD-10-CM | POA: Diagnosis not present

## 2018-06-26 DIAGNOSIS — L308 Other specified dermatitis: Secondary | ICD-10-CM | POA: Diagnosis not present

## 2018-06-26 DIAGNOSIS — M1711 Unilateral primary osteoarthritis, right knee: Secondary | ICD-10-CM | POA: Diagnosis not present

## 2018-06-26 DIAGNOSIS — M5137 Other intervertebral disc degeneration, lumbosacral region: Secondary | ICD-10-CM | POA: Diagnosis not present

## 2018-06-26 DIAGNOSIS — I2510 Atherosclerotic heart disease of native coronary artery without angina pectoris: Secondary | ICD-10-CM | POA: Diagnosis not present

## 2018-06-28 DIAGNOSIS — I119 Hypertensive heart disease without heart failure: Secondary | ICD-10-CM | POA: Diagnosis not present

## 2018-06-28 DIAGNOSIS — E1140 Type 2 diabetes mellitus with diabetic neuropathy, unspecified: Secondary | ICD-10-CM | POA: Diagnosis not present

## 2018-06-28 DIAGNOSIS — I2510 Atherosclerotic heart disease of native coronary artery without angina pectoris: Secondary | ICD-10-CM | POA: Diagnosis not present

## 2018-06-28 DIAGNOSIS — M1711 Unilateral primary osteoarthritis, right knee: Secondary | ICD-10-CM | POA: Diagnosis not present

## 2018-06-28 DIAGNOSIS — L308 Other specified dermatitis: Secondary | ICD-10-CM | POA: Diagnosis not present

## 2018-06-28 DIAGNOSIS — M5137 Other intervertebral disc degeneration, lumbosacral region: Secondary | ICD-10-CM | POA: Diagnosis not present

## 2018-07-01 DIAGNOSIS — E1140 Type 2 diabetes mellitus with diabetic neuropathy, unspecified: Secondary | ICD-10-CM | POA: Diagnosis not present

## 2018-07-01 DIAGNOSIS — M1711 Unilateral primary osteoarthritis, right knee: Secondary | ICD-10-CM | POA: Diagnosis not present

## 2018-07-01 DIAGNOSIS — L308 Other specified dermatitis: Secondary | ICD-10-CM | POA: Diagnosis not present

## 2018-07-01 DIAGNOSIS — M5137 Other intervertebral disc degeneration, lumbosacral region: Secondary | ICD-10-CM | POA: Diagnosis not present

## 2018-07-01 DIAGNOSIS — I2510 Atherosclerotic heart disease of native coronary artery without angina pectoris: Secondary | ICD-10-CM | POA: Diagnosis not present

## 2018-07-01 DIAGNOSIS — I119 Hypertensive heart disease without heart failure: Secondary | ICD-10-CM | POA: Diagnosis not present

## 2018-07-02 DIAGNOSIS — M5137 Other intervertebral disc degeneration, lumbosacral region: Secondary | ICD-10-CM | POA: Diagnosis not present

## 2018-07-02 DIAGNOSIS — I119 Hypertensive heart disease without heart failure: Secondary | ICD-10-CM | POA: Diagnosis not present

## 2018-07-02 DIAGNOSIS — I2510 Atherosclerotic heart disease of native coronary artery without angina pectoris: Secondary | ICD-10-CM | POA: Diagnosis not present

## 2018-07-02 DIAGNOSIS — E1140 Type 2 diabetes mellitus with diabetic neuropathy, unspecified: Secondary | ICD-10-CM | POA: Diagnosis not present

## 2018-07-02 DIAGNOSIS — M1711 Unilateral primary osteoarthritis, right knee: Secondary | ICD-10-CM | POA: Diagnosis not present

## 2018-07-02 DIAGNOSIS — L308 Other specified dermatitis: Secondary | ICD-10-CM | POA: Diagnosis not present

## 2018-07-03 DIAGNOSIS — E1140 Type 2 diabetes mellitus with diabetic neuropathy, unspecified: Secondary | ICD-10-CM | POA: Diagnosis not present

## 2018-07-03 DIAGNOSIS — M1711 Unilateral primary osteoarthritis, right knee: Secondary | ICD-10-CM | POA: Diagnosis not present

## 2018-07-03 DIAGNOSIS — I2510 Atherosclerotic heart disease of native coronary artery without angina pectoris: Secondary | ICD-10-CM | POA: Diagnosis not present

## 2018-07-03 DIAGNOSIS — M5137 Other intervertebral disc degeneration, lumbosacral region: Secondary | ICD-10-CM | POA: Diagnosis not present

## 2018-07-03 DIAGNOSIS — I119 Hypertensive heart disease without heart failure: Secondary | ICD-10-CM | POA: Diagnosis not present

## 2018-07-03 DIAGNOSIS — L308 Other specified dermatitis: Secondary | ICD-10-CM | POA: Diagnosis not present

## 2018-07-04 DIAGNOSIS — M1711 Unilateral primary osteoarthritis, right knee: Secondary | ICD-10-CM | POA: Diagnosis not present

## 2018-07-04 DIAGNOSIS — M5137 Other intervertebral disc degeneration, lumbosacral region: Secondary | ICD-10-CM | POA: Diagnosis not present

## 2018-07-04 DIAGNOSIS — I119 Hypertensive heart disease without heart failure: Secondary | ICD-10-CM | POA: Diagnosis not present

## 2018-07-04 DIAGNOSIS — E1140 Type 2 diabetes mellitus with diabetic neuropathy, unspecified: Secondary | ICD-10-CM | POA: Diagnosis not present

## 2018-07-04 DIAGNOSIS — I2510 Atherosclerotic heart disease of native coronary artery without angina pectoris: Secondary | ICD-10-CM | POA: Diagnosis not present

## 2018-07-04 DIAGNOSIS — L308 Other specified dermatitis: Secondary | ICD-10-CM | POA: Diagnosis not present

## 2018-07-05 ENCOUNTER — Ambulatory Visit (INDEPENDENT_AMBULATORY_CARE_PROVIDER_SITE_OTHER): Payer: Medicare HMO | Admitting: Podiatry

## 2018-07-05 ENCOUNTER — Encounter: Payer: Self-pay | Admitting: Podiatry

## 2018-07-05 DIAGNOSIS — B351 Tinea unguium: Secondary | ICD-10-CM

## 2018-07-05 DIAGNOSIS — M79676 Pain in unspecified toe(s): Secondary | ICD-10-CM

## 2018-07-05 DIAGNOSIS — E119 Type 2 diabetes mellitus without complications: Secondary | ICD-10-CM

## 2018-07-05 NOTE — Progress Notes (Signed)
Complaint:  Visit Type: Patient returns to my office for continued preventative foot care services. Complaint: Patient states" my nails have grown long and thick and become painful to walk and wear shoes" Patient has been diagnosed with DM with no foot complications. The patient presents for preventative foot care services. No changes to ROS.  Patient requests second toes both feet not be done as well as not using the dremel tool in treatment of her nails.  Podiatric Exam: Vascular: dorsalis pedis and posterior tibial pulses are palpable bilateral. Capillary return is immediate. Temperature gradient is WNL. Skin turgor WNL  Sensorium: Normal Semmes Weinstein monofilament test. Normal tactile sensation bilaterally. Nail Exam: Pt has thick disfigured discolored nails with subungual debris noted 1,2,3  B/L Ulcer Exam: There is no evidence of ulcer or pre-ulcerative changes or infection. Orthopedic Exam: Muscle tone and strength are WNL. No limitations in general ROM. No crepitus or effusions noted. Foot type and digits show no abnormalities. Bony prominences are unremarkable. Skin: No Porokeratosis. No infection or ulcers  Diagnosis:  Onychomycosis, , Pain in right toe, pain in left toes  Treatment & Plan Procedures and Treatment: Consent by patient was obtained for treatment procedures.   Debridement of mycotic and hypertrophic toenails, 1 through 5 bilateral and clearing of subungual debris. No ulceration, no infection noted.  Return Visit-Office Procedure: Patient instructed to return to the office for a follow up visit 3 months for continued evaluation and treatment.    Lelynd Poer DPM 

## 2018-07-08 DIAGNOSIS — E1140 Type 2 diabetes mellitus with diabetic neuropathy, unspecified: Secondary | ICD-10-CM | POA: Diagnosis not present

## 2018-07-08 DIAGNOSIS — M5137 Other intervertebral disc degeneration, lumbosacral region: Secondary | ICD-10-CM | POA: Diagnosis not present

## 2018-07-08 DIAGNOSIS — I2510 Atherosclerotic heart disease of native coronary artery without angina pectoris: Secondary | ICD-10-CM | POA: Diagnosis not present

## 2018-07-08 DIAGNOSIS — I119 Hypertensive heart disease without heart failure: Secondary | ICD-10-CM | POA: Diagnosis not present

## 2018-07-08 DIAGNOSIS — M1711 Unilateral primary osteoarthritis, right knee: Secondary | ICD-10-CM | POA: Diagnosis not present

## 2018-07-08 DIAGNOSIS — L308 Other specified dermatitis: Secondary | ICD-10-CM | POA: Diagnosis not present

## 2018-07-10 DIAGNOSIS — M1711 Unilateral primary osteoarthritis, right knee: Secondary | ICD-10-CM | POA: Diagnosis not present

## 2018-07-10 DIAGNOSIS — I2510 Atherosclerotic heart disease of native coronary artery without angina pectoris: Secondary | ICD-10-CM | POA: Diagnosis not present

## 2018-07-10 DIAGNOSIS — E1140 Type 2 diabetes mellitus with diabetic neuropathy, unspecified: Secondary | ICD-10-CM | POA: Diagnosis not present

## 2018-07-10 DIAGNOSIS — L308 Other specified dermatitis: Secondary | ICD-10-CM | POA: Diagnosis not present

## 2018-07-10 DIAGNOSIS — M5137 Other intervertebral disc degeneration, lumbosacral region: Secondary | ICD-10-CM | POA: Diagnosis not present

## 2018-07-10 DIAGNOSIS — I119 Hypertensive heart disease without heart failure: Secondary | ICD-10-CM | POA: Diagnosis not present

## 2018-07-15 DIAGNOSIS — M1711 Unilateral primary osteoarthritis, right knee: Secondary | ICD-10-CM | POA: Diagnosis not present

## 2018-07-15 DIAGNOSIS — I2510 Atherosclerotic heart disease of native coronary artery without angina pectoris: Secondary | ICD-10-CM | POA: Diagnosis not present

## 2018-07-15 DIAGNOSIS — I119 Hypertensive heart disease without heart failure: Secondary | ICD-10-CM | POA: Diagnosis not present

## 2018-07-15 DIAGNOSIS — M5137 Other intervertebral disc degeneration, lumbosacral region: Secondary | ICD-10-CM | POA: Diagnosis not present

## 2018-07-15 DIAGNOSIS — E1140 Type 2 diabetes mellitus with diabetic neuropathy, unspecified: Secondary | ICD-10-CM | POA: Diagnosis not present

## 2018-07-15 DIAGNOSIS — L308 Other specified dermatitis: Secondary | ICD-10-CM | POA: Diagnosis not present

## 2018-07-18 DIAGNOSIS — I119 Hypertensive heart disease without heart failure: Secondary | ICD-10-CM | POA: Diagnosis not present

## 2018-07-18 DIAGNOSIS — M1711 Unilateral primary osteoarthritis, right knee: Secondary | ICD-10-CM | POA: Diagnosis not present

## 2018-07-18 DIAGNOSIS — L308 Other specified dermatitis: Secondary | ICD-10-CM | POA: Diagnosis not present

## 2018-07-18 DIAGNOSIS — M5137 Other intervertebral disc degeneration, lumbosacral region: Secondary | ICD-10-CM | POA: Diagnosis not present

## 2018-07-18 DIAGNOSIS — I2510 Atherosclerotic heart disease of native coronary artery without angina pectoris: Secondary | ICD-10-CM | POA: Diagnosis not present

## 2018-07-18 DIAGNOSIS — E1140 Type 2 diabetes mellitus with diabetic neuropathy, unspecified: Secondary | ICD-10-CM | POA: Diagnosis not present

## 2018-07-22 DIAGNOSIS — L308 Other specified dermatitis: Secondary | ICD-10-CM | POA: Diagnosis not present

## 2018-07-22 DIAGNOSIS — M5137 Other intervertebral disc degeneration, lumbosacral region: Secondary | ICD-10-CM | POA: Diagnosis not present

## 2018-07-22 DIAGNOSIS — M1711 Unilateral primary osteoarthritis, right knee: Secondary | ICD-10-CM | POA: Diagnosis not present

## 2018-07-22 DIAGNOSIS — E1140 Type 2 diabetes mellitus with diabetic neuropathy, unspecified: Secondary | ICD-10-CM | POA: Diagnosis not present

## 2018-07-22 DIAGNOSIS — I119 Hypertensive heart disease without heart failure: Secondary | ICD-10-CM | POA: Diagnosis not present

## 2018-07-22 DIAGNOSIS — I2510 Atherosclerotic heart disease of native coronary artery without angina pectoris: Secondary | ICD-10-CM | POA: Diagnosis not present

## 2018-07-24 DIAGNOSIS — E1140 Type 2 diabetes mellitus with diabetic neuropathy, unspecified: Secondary | ICD-10-CM | POA: Diagnosis not present

## 2018-07-24 DIAGNOSIS — M1711 Unilateral primary osteoarthritis, right knee: Secondary | ICD-10-CM | POA: Diagnosis not present

## 2018-07-24 DIAGNOSIS — I2510 Atherosclerotic heart disease of native coronary artery without angina pectoris: Secondary | ICD-10-CM | POA: Diagnosis not present

## 2018-07-24 DIAGNOSIS — I119 Hypertensive heart disease without heart failure: Secondary | ICD-10-CM | POA: Diagnosis not present

## 2018-07-24 DIAGNOSIS — M5137 Other intervertebral disc degeneration, lumbosacral region: Secondary | ICD-10-CM | POA: Diagnosis not present

## 2018-07-24 DIAGNOSIS — L308 Other specified dermatitis: Secondary | ICD-10-CM | POA: Diagnosis not present

## 2018-07-30 DIAGNOSIS — E1140 Type 2 diabetes mellitus with diabetic neuropathy, unspecified: Secondary | ICD-10-CM | POA: Diagnosis not present

## 2018-07-30 DIAGNOSIS — M5137 Other intervertebral disc degeneration, lumbosacral region: Secondary | ICD-10-CM | POA: Diagnosis not present

## 2018-07-30 DIAGNOSIS — I2510 Atherosclerotic heart disease of native coronary artery without angina pectoris: Secondary | ICD-10-CM | POA: Diagnosis not present

## 2018-07-30 DIAGNOSIS — I119 Hypertensive heart disease without heart failure: Secondary | ICD-10-CM | POA: Diagnosis not present

## 2018-07-30 DIAGNOSIS — M1711 Unilateral primary osteoarthritis, right knee: Secondary | ICD-10-CM | POA: Diagnosis not present

## 2018-07-30 DIAGNOSIS — L308 Other specified dermatitis: Secondary | ICD-10-CM | POA: Diagnosis not present

## 2018-08-02 DIAGNOSIS — E1140 Type 2 diabetes mellitus with diabetic neuropathy, unspecified: Secondary | ICD-10-CM | POA: Diagnosis not present

## 2018-08-02 DIAGNOSIS — M1711 Unilateral primary osteoarthritis, right knee: Secondary | ICD-10-CM | POA: Diagnosis not present

## 2018-08-02 DIAGNOSIS — L308 Other specified dermatitis: Secondary | ICD-10-CM | POA: Diagnosis not present

## 2018-08-02 DIAGNOSIS — M5137 Other intervertebral disc degeneration, lumbosacral region: Secondary | ICD-10-CM | POA: Diagnosis not present

## 2018-08-02 DIAGNOSIS — I119 Hypertensive heart disease without heart failure: Secondary | ICD-10-CM | POA: Diagnosis not present

## 2018-08-02 DIAGNOSIS — I2510 Atherosclerotic heart disease of native coronary artery without angina pectoris: Secondary | ICD-10-CM | POA: Diagnosis not present

## 2018-08-13 DIAGNOSIS — I2510 Atherosclerotic heart disease of native coronary artery without angina pectoris: Secondary | ICD-10-CM | POA: Diagnosis not present

## 2018-08-13 DIAGNOSIS — E119 Type 2 diabetes mellitus without complications: Secondary | ICD-10-CM | POA: Diagnosis not present

## 2018-08-13 DIAGNOSIS — E039 Hypothyroidism, unspecified: Secondary | ICD-10-CM | POA: Diagnosis not present

## 2018-08-13 DIAGNOSIS — I10 Essential (primary) hypertension: Secondary | ICD-10-CM | POA: Diagnosis not present

## 2018-08-13 DIAGNOSIS — E785 Hyperlipidemia, unspecified: Secondary | ICD-10-CM | POA: Diagnosis not present

## 2018-09-19 DIAGNOSIS — E039 Hypothyroidism, unspecified: Secondary | ICD-10-CM | POA: Diagnosis not present

## 2018-09-19 DIAGNOSIS — E7800 Pure hypercholesterolemia, unspecified: Secondary | ICD-10-CM | POA: Diagnosis not present

## 2018-09-19 DIAGNOSIS — I2510 Atherosclerotic heart disease of native coronary artery without angina pectoris: Secondary | ICD-10-CM | POA: Diagnosis not present

## 2018-09-19 DIAGNOSIS — Z23 Encounter for immunization: Secondary | ICD-10-CM | POA: Diagnosis not present

## 2018-09-19 DIAGNOSIS — I10 Essential (primary) hypertension: Secondary | ICD-10-CM | POA: Diagnosis not present

## 2018-09-19 DIAGNOSIS — E119 Type 2 diabetes mellitus without complications: Secondary | ICD-10-CM | POA: Diagnosis not present

## 2018-09-19 DIAGNOSIS — Z0001 Encounter for general adult medical examination with abnormal findings: Secondary | ICD-10-CM | POA: Diagnosis not present

## 2018-09-26 DIAGNOSIS — E039 Hypothyroidism, unspecified: Secondary | ICD-10-CM | POA: Diagnosis not present

## 2018-09-26 DIAGNOSIS — L309 Dermatitis, unspecified: Secondary | ICD-10-CM | POA: Diagnosis not present

## 2018-09-26 DIAGNOSIS — M179 Osteoarthritis of knee, unspecified: Secondary | ICD-10-CM | POA: Diagnosis not present

## 2018-09-26 DIAGNOSIS — I2510 Atherosclerotic heart disease of native coronary artery without angina pectoris: Secondary | ICD-10-CM | POA: Diagnosis not present

## 2018-09-26 DIAGNOSIS — H3530 Unspecified macular degeneration: Secondary | ICD-10-CM | POA: Diagnosis not present

## 2018-09-26 DIAGNOSIS — E559 Vitamin D deficiency, unspecified: Secondary | ICD-10-CM | POA: Diagnosis not present

## 2018-09-26 DIAGNOSIS — E1140 Type 2 diabetes mellitus with diabetic neuropathy, unspecified: Secondary | ICD-10-CM | POA: Diagnosis not present

## 2018-09-26 DIAGNOSIS — I10 Essential (primary) hypertension: Secondary | ICD-10-CM | POA: Diagnosis not present

## 2018-09-26 DIAGNOSIS — M5137 Other intervertebral disc degeneration, lumbosacral region: Secondary | ICD-10-CM | POA: Diagnosis not present

## 2018-10-02 DIAGNOSIS — I10 Essential (primary) hypertension: Secondary | ICD-10-CM | POA: Diagnosis not present

## 2018-10-02 DIAGNOSIS — M179 Osteoarthritis of knee, unspecified: Secondary | ICD-10-CM | POA: Diagnosis not present

## 2018-10-02 DIAGNOSIS — M5137 Other intervertebral disc degeneration, lumbosacral region: Secondary | ICD-10-CM | POA: Diagnosis not present

## 2018-10-02 DIAGNOSIS — L309 Dermatitis, unspecified: Secondary | ICD-10-CM | POA: Diagnosis not present

## 2018-10-02 DIAGNOSIS — E039 Hypothyroidism, unspecified: Secondary | ICD-10-CM | POA: Diagnosis not present

## 2018-10-02 DIAGNOSIS — H3530 Unspecified macular degeneration: Secondary | ICD-10-CM | POA: Diagnosis not present

## 2018-10-02 DIAGNOSIS — E1140 Type 2 diabetes mellitus with diabetic neuropathy, unspecified: Secondary | ICD-10-CM | POA: Diagnosis not present

## 2018-10-02 DIAGNOSIS — E559 Vitamin D deficiency, unspecified: Secondary | ICD-10-CM | POA: Diagnosis not present

## 2018-10-02 DIAGNOSIS — I2510 Atherosclerotic heart disease of native coronary artery without angina pectoris: Secondary | ICD-10-CM | POA: Diagnosis not present

## 2018-10-04 ENCOUNTER — Ambulatory Visit (INDEPENDENT_AMBULATORY_CARE_PROVIDER_SITE_OTHER): Payer: Medicare HMO | Admitting: Podiatry

## 2018-10-04 ENCOUNTER — Encounter: Payer: Self-pay | Admitting: Podiatry

## 2018-10-04 DIAGNOSIS — E119 Type 2 diabetes mellitus without complications: Secondary | ICD-10-CM

## 2018-10-04 DIAGNOSIS — B351 Tinea unguium: Secondary | ICD-10-CM | POA: Diagnosis not present

## 2018-10-04 DIAGNOSIS — M79676 Pain in unspecified toe(s): Secondary | ICD-10-CM | POA: Diagnosis not present

## 2018-10-04 NOTE — Progress Notes (Signed)
Complaint:  Visit Type: Patient returns to my office for continued preventative foot care services. Complaint: Patient states" my nails have grown long and thick and become painful to walk and wear shoes" Patient has been diagnosed with DM with no foot complications. The patient presents for preventative foot care services. No changes to ROS.  Patient requests second toes both feet not be done as well as not using the dremel tool in treatment of her nails.  Podiatric Exam: Vascular: dorsalis pedis and posterior tibial pulses are palpable bilateral. Capillary return is immediate. Temperature gradient is WNL. Skin turgor WNL  Sensorium: Normal Semmes Weinstein monofilament test. Normal tactile sensation bilaterally. Nail Exam: Pt has thick disfigured discolored nails with subungual debris noted 1,2,3  B/L Ulcer Exam: There is no evidence of ulcer or pre-ulcerative changes or infection. Orthopedic Exam: Muscle tone and strength are WNL. No limitations in general ROM. No crepitus or effusions noted. Foot type and digits show no abnormalities. Bony prominences are unremarkable. Skin: No Porokeratosis. No infection or ulcers  Diagnosis:  Onychomycosis, , Pain in right toe, pain in left toes  Treatment & Plan Procedures and Treatment: Consent by patient was obtained for treatment procedures.   Debridement of mycotic and hypertrophic toenails, 1 through 5 bilateral and clearing of subungual debris. No ulceration, no infection noted. No dremel used for this patient. Return Visit-Office Procedure: Patient instructed to return to the office for a follow up visit 3 months for continued evaluation and treatment.    Helane Gunther DPM

## 2018-10-09 DIAGNOSIS — I10 Essential (primary) hypertension: Secondary | ICD-10-CM | POA: Diagnosis not present

## 2018-10-09 DIAGNOSIS — E1140 Type 2 diabetes mellitus with diabetic neuropathy, unspecified: Secondary | ICD-10-CM | POA: Diagnosis not present

## 2018-10-09 DIAGNOSIS — H3530 Unspecified macular degeneration: Secondary | ICD-10-CM | POA: Diagnosis not present

## 2018-10-09 DIAGNOSIS — I2510 Atherosclerotic heart disease of native coronary artery without angina pectoris: Secondary | ICD-10-CM | POA: Diagnosis not present

## 2018-10-09 DIAGNOSIS — L309 Dermatitis, unspecified: Secondary | ICD-10-CM | POA: Diagnosis not present

## 2018-10-09 DIAGNOSIS — M5137 Other intervertebral disc degeneration, lumbosacral region: Secondary | ICD-10-CM | POA: Diagnosis not present

## 2018-10-09 DIAGNOSIS — E039 Hypothyroidism, unspecified: Secondary | ICD-10-CM | POA: Diagnosis not present

## 2018-10-09 DIAGNOSIS — E559 Vitamin D deficiency, unspecified: Secondary | ICD-10-CM | POA: Diagnosis not present

## 2018-10-09 DIAGNOSIS — M179 Osteoarthritis of knee, unspecified: Secondary | ICD-10-CM | POA: Diagnosis not present

## 2018-10-10 DIAGNOSIS — E559 Vitamin D deficiency, unspecified: Secondary | ICD-10-CM | POA: Diagnosis not present

## 2018-10-10 DIAGNOSIS — E1140 Type 2 diabetes mellitus with diabetic neuropathy, unspecified: Secondary | ICD-10-CM | POA: Diagnosis not present

## 2018-10-10 DIAGNOSIS — H3530 Unspecified macular degeneration: Secondary | ICD-10-CM | POA: Diagnosis not present

## 2018-10-10 DIAGNOSIS — I10 Essential (primary) hypertension: Secondary | ICD-10-CM | POA: Diagnosis not present

## 2018-10-10 DIAGNOSIS — I2510 Atherosclerotic heart disease of native coronary artery without angina pectoris: Secondary | ICD-10-CM | POA: Diagnosis not present

## 2018-10-10 DIAGNOSIS — L309 Dermatitis, unspecified: Secondary | ICD-10-CM | POA: Diagnosis not present

## 2018-10-10 DIAGNOSIS — M179 Osteoarthritis of knee, unspecified: Secondary | ICD-10-CM | POA: Diagnosis not present

## 2018-10-10 DIAGNOSIS — M5137 Other intervertebral disc degeneration, lumbosacral region: Secondary | ICD-10-CM | POA: Diagnosis not present

## 2018-10-10 DIAGNOSIS — E039 Hypothyroidism, unspecified: Secondary | ICD-10-CM | POA: Diagnosis not present

## 2018-10-15 DIAGNOSIS — I2510 Atherosclerotic heart disease of native coronary artery without angina pectoris: Secondary | ICD-10-CM | POA: Diagnosis not present

## 2018-10-15 DIAGNOSIS — I10 Essential (primary) hypertension: Secondary | ICD-10-CM | POA: Diagnosis not present

## 2018-10-15 DIAGNOSIS — H3530 Unspecified macular degeneration: Secondary | ICD-10-CM | POA: Diagnosis not present

## 2018-10-15 DIAGNOSIS — E1140 Type 2 diabetes mellitus with diabetic neuropathy, unspecified: Secondary | ICD-10-CM | POA: Diagnosis not present

## 2018-10-15 DIAGNOSIS — M179 Osteoarthritis of knee, unspecified: Secondary | ICD-10-CM | POA: Diagnosis not present

## 2018-10-15 DIAGNOSIS — M5137 Other intervertebral disc degeneration, lumbosacral region: Secondary | ICD-10-CM | POA: Diagnosis not present

## 2018-10-15 DIAGNOSIS — E039 Hypothyroidism, unspecified: Secondary | ICD-10-CM | POA: Diagnosis not present

## 2018-10-15 DIAGNOSIS — L309 Dermatitis, unspecified: Secondary | ICD-10-CM | POA: Diagnosis not present

## 2018-10-15 DIAGNOSIS — E559 Vitamin D deficiency, unspecified: Secondary | ICD-10-CM | POA: Diagnosis not present

## 2018-10-17 DIAGNOSIS — E039 Hypothyroidism, unspecified: Secondary | ICD-10-CM | POA: Diagnosis not present

## 2018-10-17 DIAGNOSIS — E559 Vitamin D deficiency, unspecified: Secondary | ICD-10-CM | POA: Diagnosis not present

## 2018-10-17 DIAGNOSIS — H3530 Unspecified macular degeneration: Secondary | ICD-10-CM | POA: Diagnosis not present

## 2018-10-17 DIAGNOSIS — L309 Dermatitis, unspecified: Secondary | ICD-10-CM | POA: Diagnosis not present

## 2018-10-17 DIAGNOSIS — M179 Osteoarthritis of knee, unspecified: Secondary | ICD-10-CM | POA: Diagnosis not present

## 2018-10-17 DIAGNOSIS — I10 Essential (primary) hypertension: Secondary | ICD-10-CM | POA: Diagnosis not present

## 2018-10-17 DIAGNOSIS — I2510 Atherosclerotic heart disease of native coronary artery without angina pectoris: Secondary | ICD-10-CM | POA: Diagnosis not present

## 2018-10-17 DIAGNOSIS — E1140 Type 2 diabetes mellitus with diabetic neuropathy, unspecified: Secondary | ICD-10-CM | POA: Diagnosis not present

## 2018-10-17 DIAGNOSIS — M5137 Other intervertebral disc degeneration, lumbosacral region: Secondary | ICD-10-CM | POA: Diagnosis not present

## 2018-10-21 DIAGNOSIS — E1140 Type 2 diabetes mellitus with diabetic neuropathy, unspecified: Secondary | ICD-10-CM | POA: Diagnosis not present

## 2018-10-21 DIAGNOSIS — I10 Essential (primary) hypertension: Secondary | ICD-10-CM | POA: Diagnosis not present

## 2018-10-21 DIAGNOSIS — H3530 Unspecified macular degeneration: Secondary | ICD-10-CM | POA: Diagnosis not present

## 2018-10-21 DIAGNOSIS — E039 Hypothyroidism, unspecified: Secondary | ICD-10-CM | POA: Diagnosis not present

## 2018-10-21 DIAGNOSIS — E559 Vitamin D deficiency, unspecified: Secondary | ICD-10-CM | POA: Diagnosis not present

## 2018-10-21 DIAGNOSIS — I2510 Atherosclerotic heart disease of native coronary artery without angina pectoris: Secondary | ICD-10-CM | POA: Diagnosis not present

## 2018-10-21 DIAGNOSIS — M5137 Other intervertebral disc degeneration, lumbosacral region: Secondary | ICD-10-CM | POA: Diagnosis not present

## 2018-10-21 DIAGNOSIS — L309 Dermatitis, unspecified: Secondary | ICD-10-CM | POA: Diagnosis not present

## 2018-10-21 DIAGNOSIS — M179 Osteoarthritis of knee, unspecified: Secondary | ICD-10-CM | POA: Diagnosis not present

## 2018-10-22 DIAGNOSIS — M5137 Other intervertebral disc degeneration, lumbosacral region: Secondary | ICD-10-CM | POA: Diagnosis not present

## 2018-10-22 DIAGNOSIS — E1140 Type 2 diabetes mellitus with diabetic neuropathy, unspecified: Secondary | ICD-10-CM | POA: Diagnosis not present

## 2018-10-22 DIAGNOSIS — R269 Unspecified abnormalities of gait and mobility: Secondary | ICD-10-CM | POA: Diagnosis not present

## 2018-10-22 DIAGNOSIS — Z87898 Personal history of other specified conditions: Secondary | ICD-10-CM | POA: Diagnosis not present

## 2018-10-25 DIAGNOSIS — M5137 Other intervertebral disc degeneration, lumbosacral region: Secondary | ICD-10-CM | POA: Diagnosis not present

## 2018-10-25 DIAGNOSIS — I10 Essential (primary) hypertension: Secondary | ICD-10-CM | POA: Diagnosis not present

## 2018-10-25 DIAGNOSIS — I2510 Atherosclerotic heart disease of native coronary artery without angina pectoris: Secondary | ICD-10-CM | POA: Diagnosis not present

## 2018-10-25 DIAGNOSIS — E559 Vitamin D deficiency, unspecified: Secondary | ICD-10-CM | POA: Diagnosis not present

## 2018-10-25 DIAGNOSIS — L309 Dermatitis, unspecified: Secondary | ICD-10-CM | POA: Diagnosis not present

## 2018-10-25 DIAGNOSIS — E1140 Type 2 diabetes mellitus with diabetic neuropathy, unspecified: Secondary | ICD-10-CM | POA: Diagnosis not present

## 2018-10-25 DIAGNOSIS — E039 Hypothyroidism, unspecified: Secondary | ICD-10-CM | POA: Diagnosis not present

## 2018-10-25 DIAGNOSIS — M179 Osteoarthritis of knee, unspecified: Secondary | ICD-10-CM | POA: Diagnosis not present

## 2018-10-25 DIAGNOSIS — H3530 Unspecified macular degeneration: Secondary | ICD-10-CM | POA: Diagnosis not present

## 2018-10-29 DIAGNOSIS — E1140 Type 2 diabetes mellitus with diabetic neuropathy, unspecified: Secondary | ICD-10-CM | POA: Diagnosis not present

## 2018-10-29 DIAGNOSIS — E039 Hypothyroidism, unspecified: Secondary | ICD-10-CM | POA: Diagnosis not present

## 2018-10-29 DIAGNOSIS — I10 Essential (primary) hypertension: Secondary | ICD-10-CM | POA: Diagnosis not present

## 2018-10-29 DIAGNOSIS — M5137 Other intervertebral disc degeneration, lumbosacral region: Secondary | ICD-10-CM | POA: Diagnosis not present

## 2018-10-29 DIAGNOSIS — M179 Osteoarthritis of knee, unspecified: Secondary | ICD-10-CM | POA: Diagnosis not present

## 2018-10-29 DIAGNOSIS — H3530 Unspecified macular degeneration: Secondary | ICD-10-CM | POA: Diagnosis not present

## 2018-10-29 DIAGNOSIS — L309 Dermatitis, unspecified: Secondary | ICD-10-CM | POA: Diagnosis not present

## 2018-10-29 DIAGNOSIS — E559 Vitamin D deficiency, unspecified: Secondary | ICD-10-CM | POA: Diagnosis not present

## 2018-10-29 DIAGNOSIS — I2510 Atherosclerotic heart disease of native coronary artery without angina pectoris: Secondary | ICD-10-CM | POA: Diagnosis not present

## 2018-10-31 DIAGNOSIS — E039 Hypothyroidism, unspecified: Secondary | ICD-10-CM | POA: Diagnosis not present

## 2018-10-31 DIAGNOSIS — E1140 Type 2 diabetes mellitus with diabetic neuropathy, unspecified: Secondary | ICD-10-CM | POA: Diagnosis not present

## 2018-10-31 DIAGNOSIS — M179 Osteoarthritis of knee, unspecified: Secondary | ICD-10-CM | POA: Diagnosis not present

## 2018-10-31 DIAGNOSIS — E559 Vitamin D deficiency, unspecified: Secondary | ICD-10-CM | POA: Diagnosis not present

## 2018-10-31 DIAGNOSIS — M5137 Other intervertebral disc degeneration, lumbosacral region: Secondary | ICD-10-CM | POA: Diagnosis not present

## 2018-10-31 DIAGNOSIS — I10 Essential (primary) hypertension: Secondary | ICD-10-CM | POA: Diagnosis not present

## 2018-10-31 DIAGNOSIS — L309 Dermatitis, unspecified: Secondary | ICD-10-CM | POA: Diagnosis not present

## 2018-10-31 DIAGNOSIS — I2510 Atherosclerotic heart disease of native coronary artery without angina pectoris: Secondary | ICD-10-CM | POA: Diagnosis not present

## 2018-10-31 DIAGNOSIS — H3530 Unspecified macular degeneration: Secondary | ICD-10-CM | POA: Diagnosis not present

## 2018-11-05 DIAGNOSIS — H3530 Unspecified macular degeneration: Secondary | ICD-10-CM | POA: Diagnosis not present

## 2018-11-05 DIAGNOSIS — I2510 Atherosclerotic heart disease of native coronary artery without angina pectoris: Secondary | ICD-10-CM | POA: Diagnosis not present

## 2018-11-05 DIAGNOSIS — E1140 Type 2 diabetes mellitus with diabetic neuropathy, unspecified: Secondary | ICD-10-CM | POA: Diagnosis not present

## 2018-11-05 DIAGNOSIS — M179 Osteoarthritis of knee, unspecified: Secondary | ICD-10-CM | POA: Diagnosis not present

## 2018-11-05 DIAGNOSIS — M5137 Other intervertebral disc degeneration, lumbosacral region: Secondary | ICD-10-CM | POA: Diagnosis not present

## 2018-11-05 DIAGNOSIS — E039 Hypothyroidism, unspecified: Secondary | ICD-10-CM | POA: Diagnosis not present

## 2018-11-05 DIAGNOSIS — I10 Essential (primary) hypertension: Secondary | ICD-10-CM | POA: Diagnosis not present

## 2018-11-05 DIAGNOSIS — E559 Vitamin D deficiency, unspecified: Secondary | ICD-10-CM | POA: Diagnosis not present

## 2018-11-05 DIAGNOSIS — L309 Dermatitis, unspecified: Secondary | ICD-10-CM | POA: Diagnosis not present

## 2018-11-12 DIAGNOSIS — E119 Type 2 diabetes mellitus without complications: Secondary | ICD-10-CM | POA: Diagnosis not present

## 2018-11-12 DIAGNOSIS — Z23 Encounter for immunization: Secondary | ICD-10-CM | POA: Diagnosis not present

## 2018-11-12 DIAGNOSIS — Z1329 Encounter for screening for other suspected endocrine disorder: Secondary | ICD-10-CM | POA: Diagnosis not present

## 2018-11-12 DIAGNOSIS — I2510 Atherosclerotic heart disease of native coronary artery without angina pectoris: Secondary | ICD-10-CM | POA: Diagnosis not present

## 2018-11-12 DIAGNOSIS — I10 Essential (primary) hypertension: Secondary | ICD-10-CM | POA: Diagnosis not present

## 2018-11-12 DIAGNOSIS — E7800 Pure hypercholesterolemia, unspecified: Secondary | ICD-10-CM | POA: Diagnosis not present

## 2018-11-12 DIAGNOSIS — E039 Hypothyroidism, unspecified: Secondary | ICD-10-CM | POA: Diagnosis not present

## 2018-11-12 DIAGNOSIS — Z01118 Encounter for examination of ears and hearing with other abnormal findings: Secondary | ICD-10-CM | POA: Diagnosis not present

## 2018-11-12 DIAGNOSIS — Z0001 Encounter for general adult medical examination with abnormal findings: Secondary | ICD-10-CM | POA: Diagnosis not present

## 2018-11-12 DIAGNOSIS — Z136 Encounter for screening for cardiovascular disorders: Secondary | ICD-10-CM | POA: Diagnosis not present

## 2018-11-14 DIAGNOSIS — L309 Dermatitis, unspecified: Secondary | ICD-10-CM | POA: Diagnosis not present

## 2018-11-14 DIAGNOSIS — I2510 Atherosclerotic heart disease of native coronary artery without angina pectoris: Secondary | ICD-10-CM | POA: Diagnosis not present

## 2018-11-14 DIAGNOSIS — E039 Hypothyroidism, unspecified: Secondary | ICD-10-CM | POA: Diagnosis not present

## 2018-11-14 DIAGNOSIS — E1140 Type 2 diabetes mellitus with diabetic neuropathy, unspecified: Secondary | ICD-10-CM | POA: Diagnosis not present

## 2018-11-14 DIAGNOSIS — I10 Essential (primary) hypertension: Secondary | ICD-10-CM | POA: Diagnosis not present

## 2018-11-14 DIAGNOSIS — E559 Vitamin D deficiency, unspecified: Secondary | ICD-10-CM | POA: Diagnosis not present

## 2018-11-14 DIAGNOSIS — M5137 Other intervertebral disc degeneration, lumbosacral region: Secondary | ICD-10-CM | POA: Diagnosis not present

## 2018-11-14 DIAGNOSIS — H3530 Unspecified macular degeneration: Secondary | ICD-10-CM | POA: Diagnosis not present

## 2018-11-14 DIAGNOSIS — M179 Osteoarthritis of knee, unspecified: Secondary | ICD-10-CM | POA: Diagnosis not present

## 2018-11-20 DIAGNOSIS — E1140 Type 2 diabetes mellitus with diabetic neuropathy, unspecified: Secondary | ICD-10-CM | POA: Diagnosis not present

## 2018-11-20 DIAGNOSIS — M179 Osteoarthritis of knee, unspecified: Secondary | ICD-10-CM | POA: Diagnosis not present

## 2018-11-20 DIAGNOSIS — I2510 Atherosclerotic heart disease of native coronary artery without angina pectoris: Secondary | ICD-10-CM | POA: Diagnosis not present

## 2018-11-20 DIAGNOSIS — I10 Essential (primary) hypertension: Secondary | ICD-10-CM | POA: Diagnosis not present

## 2018-11-20 DIAGNOSIS — E039 Hypothyroidism, unspecified: Secondary | ICD-10-CM | POA: Diagnosis not present

## 2018-11-20 DIAGNOSIS — M5137 Other intervertebral disc degeneration, lumbosacral region: Secondary | ICD-10-CM | POA: Diagnosis not present

## 2018-11-20 DIAGNOSIS — E559 Vitamin D deficiency, unspecified: Secondary | ICD-10-CM | POA: Diagnosis not present

## 2018-11-20 DIAGNOSIS — L309 Dermatitis, unspecified: Secondary | ICD-10-CM | POA: Diagnosis not present

## 2018-11-20 DIAGNOSIS — H3530 Unspecified macular degeneration: Secondary | ICD-10-CM | POA: Diagnosis not present

## 2018-12-05 DIAGNOSIS — H60333 Swimmer's ear, bilateral: Secondary | ICD-10-CM | POA: Insufficient documentation

## 2018-12-05 DIAGNOSIS — H6123 Impacted cerumen, bilateral: Secondary | ICD-10-CM | POA: Diagnosis not present

## 2018-12-05 DIAGNOSIS — H903 Sensorineural hearing loss, bilateral: Secondary | ICD-10-CM | POA: Insufficient documentation

## 2018-12-20 DIAGNOSIS — Z794 Long term (current) use of insulin: Secondary | ICD-10-CM | POA: Diagnosis not present

## 2018-12-20 DIAGNOSIS — H35342 Macular cyst, hole, or pseudohole, left eye: Secondary | ICD-10-CM | POA: Diagnosis not present

## 2018-12-20 DIAGNOSIS — Z961 Presence of intraocular lens: Secondary | ICD-10-CM | POA: Diagnosis not present

## 2018-12-20 DIAGNOSIS — E119 Type 2 diabetes mellitus without complications: Secondary | ICD-10-CM | POA: Diagnosis not present

## 2018-12-20 DIAGNOSIS — H52223 Regular astigmatism, bilateral: Secondary | ICD-10-CM | POA: Diagnosis not present

## 2018-12-20 DIAGNOSIS — H524 Presbyopia: Secondary | ICD-10-CM | POA: Diagnosis not present

## 2018-12-20 DIAGNOSIS — I10 Essential (primary) hypertension: Secondary | ICD-10-CM | POA: Diagnosis not present

## 2018-12-20 DIAGNOSIS — H5203 Hypermetropia, bilateral: Secondary | ICD-10-CM | POA: Diagnosis not present

## 2018-12-20 DIAGNOSIS — H33322 Round hole, left eye: Secondary | ICD-10-CM | POA: Diagnosis not present

## 2019-01-03 ENCOUNTER — Ambulatory Visit: Payer: Medicare HMO | Admitting: Podiatry

## 2019-01-07 DIAGNOSIS — E7800 Pure hypercholesterolemia, unspecified: Secondary | ICD-10-CM | POA: Diagnosis not present

## 2019-01-07 DIAGNOSIS — I10 Essential (primary) hypertension: Secondary | ICD-10-CM | POA: Diagnosis not present

## 2019-01-07 DIAGNOSIS — I2510 Atherosclerotic heart disease of native coronary artery without angina pectoris: Secondary | ICD-10-CM | POA: Diagnosis not present

## 2019-01-07 DIAGNOSIS — E119 Type 2 diabetes mellitus without complications: Secondary | ICD-10-CM | POA: Diagnosis not present

## 2019-01-07 DIAGNOSIS — E039 Hypothyroidism, unspecified: Secondary | ICD-10-CM | POA: Diagnosis not present

## 2019-02-21 ENCOUNTER — Ambulatory Visit (INDEPENDENT_AMBULATORY_CARE_PROVIDER_SITE_OTHER): Payer: Medicare HMO | Admitting: Podiatry

## 2019-02-21 ENCOUNTER — Encounter: Payer: Self-pay | Admitting: Podiatry

## 2019-02-21 ENCOUNTER — Other Ambulatory Visit: Payer: Self-pay

## 2019-02-21 VITALS — Temp 97.9°F

## 2019-02-21 DIAGNOSIS — M79676 Pain in unspecified toe(s): Secondary | ICD-10-CM | POA: Diagnosis not present

## 2019-02-21 DIAGNOSIS — B351 Tinea unguium: Secondary | ICD-10-CM

## 2019-02-21 DIAGNOSIS — E119 Type 2 diabetes mellitus without complications: Secondary | ICD-10-CM

## 2019-02-21 NOTE — Progress Notes (Signed)
Complaint:  Visit Type: Patient returns to my office for continued preventative foot care services. Complaint: Patient states" my nails have grown long and thick and become painful to walk and wear shoes" Patient has been diagnosed with DM with no foot complications. The patient presents for preventative foot care services. No changes to ROS.  Patient requests second toes both feet not be done as well as not using the dremel tool in treatment of her nails.  Podiatric Exam: Vascular: dorsalis pedis and posterior tibial pulses are palpable bilateral. Capillary return is immediate. Temperature gradient is WNL. Skin turgor WNL  Sensorium: Normal Semmes Weinstein monofilament test. Normal tactile sensation bilaterally. Nail Exam: Pt has thick disfigured discolored nails with subungual debris noted 1,2,3  B/L Ulcer Exam: There is no evidence of ulcer or pre-ulcerative changes or infection. Orthopedic Exam: Muscle tone and strength are WNL. No limitations in general ROM. No crepitus or effusions noted. Foot type and digits show no abnormalities. Bony prominences are unremarkable. Skin: No Porokeratosis. No infection or ulcers  Diagnosis:  Onychomycosis, , Pain in right toe, pain in left toes  Treatment & Plan Procedures and Treatment: Consent by patient was obtained for treatment procedures.   Debridement of mycotic and hypertrophic toenails, 1 through 5 bilateral and clearing of subungual debris. No ulceration, no infection noted. No dremel used for this patient. Return Visit-Office Procedure: Patient instructed to return to the office for a follow up visit 3 months for continued evaluation and treatment.    Helane Gunther DPM

## 2019-03-27 DIAGNOSIS — I10 Essential (primary) hypertension: Secondary | ICD-10-CM | POA: Diagnosis not present

## 2019-03-27 DIAGNOSIS — E1165 Type 2 diabetes mellitus with hyperglycemia: Secondary | ICD-10-CM | POA: Diagnosis not present

## 2019-03-27 DIAGNOSIS — E039 Hypothyroidism, unspecified: Secondary | ICD-10-CM | POA: Diagnosis not present

## 2019-03-27 DIAGNOSIS — Z794 Long term (current) use of insulin: Secondary | ICD-10-CM | POA: Diagnosis not present

## 2019-03-27 DIAGNOSIS — I2510 Atherosclerotic heart disease of native coronary artery without angina pectoris: Secondary | ICD-10-CM | POA: Diagnosis not present

## 2019-03-27 DIAGNOSIS — E669 Obesity, unspecified: Secondary | ICD-10-CM | POA: Diagnosis not present

## 2019-03-27 DIAGNOSIS — E7800 Pure hypercholesterolemia, unspecified: Secondary | ICD-10-CM | POA: Diagnosis not present

## 2019-04-10 DIAGNOSIS — E669 Obesity, unspecified: Secondary | ICD-10-CM | POA: Diagnosis not present

## 2019-04-10 DIAGNOSIS — E1165 Type 2 diabetes mellitus with hyperglycemia: Secondary | ICD-10-CM | POA: Diagnosis not present

## 2019-04-10 DIAGNOSIS — E039 Hypothyroidism, unspecified: Secondary | ICD-10-CM | POA: Diagnosis not present

## 2019-04-10 DIAGNOSIS — E7800 Pure hypercholesterolemia, unspecified: Secondary | ICD-10-CM | POA: Diagnosis not present

## 2019-04-10 DIAGNOSIS — I10 Essential (primary) hypertension: Secondary | ICD-10-CM | POA: Diagnosis not present

## 2019-04-10 DIAGNOSIS — I2510 Atherosclerotic heart disease of native coronary artery without angina pectoris: Secondary | ICD-10-CM | POA: Diagnosis not present

## 2019-04-10 DIAGNOSIS — Z794 Long term (current) use of insulin: Secondary | ICD-10-CM | POA: Diagnosis not present

## 2019-04-18 DIAGNOSIS — E1165 Type 2 diabetes mellitus with hyperglycemia: Secondary | ICD-10-CM | POA: Diagnosis not present

## 2019-04-18 DIAGNOSIS — M5136 Other intervertebral disc degeneration, lumbar region: Secondary | ICD-10-CM | POA: Diagnosis not present

## 2019-04-18 DIAGNOSIS — I10 Essential (primary) hypertension: Secondary | ICD-10-CM | POA: Diagnosis not present

## 2019-04-18 DIAGNOSIS — E039 Hypothyroidism, unspecified: Secondary | ICD-10-CM | POA: Diagnosis not present

## 2019-04-18 DIAGNOSIS — M5137 Other intervertebral disc degeneration, lumbosacral region: Secondary | ICD-10-CM | POA: Diagnosis not present

## 2019-04-18 DIAGNOSIS — E1140 Type 2 diabetes mellitus with diabetic neuropathy, unspecified: Secondary | ICD-10-CM | POA: Diagnosis not present

## 2019-04-18 DIAGNOSIS — I2510 Atherosclerotic heart disease of native coronary artery without angina pectoris: Secondary | ICD-10-CM | POA: Diagnosis not present

## 2019-04-18 DIAGNOSIS — H3530 Unspecified macular degeneration: Secondary | ICD-10-CM | POA: Diagnosis not present

## 2019-04-18 DIAGNOSIS — M1711 Unilateral primary osteoarthritis, right knee: Secondary | ICD-10-CM | POA: Diagnosis not present

## 2019-04-22 DIAGNOSIS — M5137 Other intervertebral disc degeneration, lumbosacral region: Secondary | ICD-10-CM | POA: Diagnosis not present

## 2019-04-22 DIAGNOSIS — H3530 Unspecified macular degeneration: Secondary | ICD-10-CM | POA: Diagnosis not present

## 2019-04-22 DIAGNOSIS — I10 Essential (primary) hypertension: Secondary | ICD-10-CM | POA: Diagnosis not present

## 2019-04-22 DIAGNOSIS — M5136 Other intervertebral disc degeneration, lumbar region: Secondary | ICD-10-CM | POA: Diagnosis not present

## 2019-04-22 DIAGNOSIS — E1140 Type 2 diabetes mellitus with diabetic neuropathy, unspecified: Secondary | ICD-10-CM | POA: Diagnosis not present

## 2019-04-22 DIAGNOSIS — E039 Hypothyroidism, unspecified: Secondary | ICD-10-CM | POA: Diagnosis not present

## 2019-04-22 DIAGNOSIS — M1711 Unilateral primary osteoarthritis, right knee: Secondary | ICD-10-CM | POA: Diagnosis not present

## 2019-04-22 DIAGNOSIS — E1165 Type 2 diabetes mellitus with hyperglycemia: Secondary | ICD-10-CM | POA: Diagnosis not present

## 2019-04-22 DIAGNOSIS — I2510 Atherosclerotic heart disease of native coronary artery without angina pectoris: Secondary | ICD-10-CM | POA: Diagnosis not present

## 2019-04-24 DIAGNOSIS — H3530 Unspecified macular degeneration: Secondary | ICD-10-CM | POA: Diagnosis not present

## 2019-04-24 DIAGNOSIS — E039 Hypothyroidism, unspecified: Secondary | ICD-10-CM | POA: Diagnosis not present

## 2019-04-24 DIAGNOSIS — I10 Essential (primary) hypertension: Secondary | ICD-10-CM | POA: Diagnosis not present

## 2019-04-24 DIAGNOSIS — E1165 Type 2 diabetes mellitus with hyperglycemia: Secondary | ICD-10-CM | POA: Diagnosis not present

## 2019-04-24 DIAGNOSIS — E1140 Type 2 diabetes mellitus with diabetic neuropathy, unspecified: Secondary | ICD-10-CM | POA: Diagnosis not present

## 2019-04-24 DIAGNOSIS — M5136 Other intervertebral disc degeneration, lumbar region: Secondary | ICD-10-CM | POA: Diagnosis not present

## 2019-04-24 DIAGNOSIS — M5137 Other intervertebral disc degeneration, lumbosacral region: Secondary | ICD-10-CM | POA: Diagnosis not present

## 2019-04-24 DIAGNOSIS — I2510 Atherosclerotic heart disease of native coronary artery without angina pectoris: Secondary | ICD-10-CM | POA: Diagnosis not present

## 2019-04-24 DIAGNOSIS — M1711 Unilateral primary osteoarthritis, right knee: Secondary | ICD-10-CM | POA: Diagnosis not present

## 2019-04-28 DIAGNOSIS — I10 Essential (primary) hypertension: Secondary | ICD-10-CM | POA: Diagnosis not present

## 2019-04-28 DIAGNOSIS — M5137 Other intervertebral disc degeneration, lumbosacral region: Secondary | ICD-10-CM | POA: Diagnosis not present

## 2019-04-28 DIAGNOSIS — E039 Hypothyroidism, unspecified: Secondary | ICD-10-CM | POA: Diagnosis not present

## 2019-04-28 DIAGNOSIS — E1140 Type 2 diabetes mellitus with diabetic neuropathy, unspecified: Secondary | ICD-10-CM | POA: Diagnosis not present

## 2019-04-28 DIAGNOSIS — H3530 Unspecified macular degeneration: Secondary | ICD-10-CM | POA: Diagnosis not present

## 2019-04-28 DIAGNOSIS — E1165 Type 2 diabetes mellitus with hyperglycemia: Secondary | ICD-10-CM | POA: Diagnosis not present

## 2019-04-28 DIAGNOSIS — M1711 Unilateral primary osteoarthritis, right knee: Secondary | ICD-10-CM | POA: Diagnosis not present

## 2019-04-28 DIAGNOSIS — I2510 Atherosclerotic heart disease of native coronary artery without angina pectoris: Secondary | ICD-10-CM | POA: Diagnosis not present

## 2019-04-28 DIAGNOSIS — M5136 Other intervertebral disc degeneration, lumbar region: Secondary | ICD-10-CM | POA: Diagnosis not present

## 2019-05-01 DIAGNOSIS — E1165 Type 2 diabetes mellitus with hyperglycemia: Secondary | ICD-10-CM | POA: Diagnosis not present

## 2019-05-01 DIAGNOSIS — E1140 Type 2 diabetes mellitus with diabetic neuropathy, unspecified: Secondary | ICD-10-CM | POA: Diagnosis not present

## 2019-05-01 DIAGNOSIS — M1711 Unilateral primary osteoarthritis, right knee: Secondary | ICD-10-CM | POA: Diagnosis not present

## 2019-05-01 DIAGNOSIS — E039 Hypothyroidism, unspecified: Secondary | ICD-10-CM | POA: Diagnosis not present

## 2019-05-01 DIAGNOSIS — M5137 Other intervertebral disc degeneration, lumbosacral region: Secondary | ICD-10-CM | POA: Diagnosis not present

## 2019-05-01 DIAGNOSIS — I10 Essential (primary) hypertension: Secondary | ICD-10-CM | POA: Diagnosis not present

## 2019-05-01 DIAGNOSIS — M5136 Other intervertebral disc degeneration, lumbar region: Secondary | ICD-10-CM | POA: Diagnosis not present

## 2019-05-01 DIAGNOSIS — I2510 Atherosclerotic heart disease of native coronary artery without angina pectoris: Secondary | ICD-10-CM | POA: Diagnosis not present

## 2019-05-01 DIAGNOSIS — H3530 Unspecified macular degeneration: Secondary | ICD-10-CM | POA: Diagnosis not present

## 2019-05-08 DIAGNOSIS — M5136 Other intervertebral disc degeneration, lumbar region: Secondary | ICD-10-CM | POA: Diagnosis not present

## 2019-05-08 DIAGNOSIS — M5137 Other intervertebral disc degeneration, lumbosacral region: Secondary | ICD-10-CM | POA: Diagnosis not present

## 2019-05-08 DIAGNOSIS — H3530 Unspecified macular degeneration: Secondary | ICD-10-CM | POA: Diagnosis not present

## 2019-05-08 DIAGNOSIS — E039 Hypothyroidism, unspecified: Secondary | ICD-10-CM | POA: Diagnosis not present

## 2019-05-08 DIAGNOSIS — I2510 Atherosclerotic heart disease of native coronary artery without angina pectoris: Secondary | ICD-10-CM | POA: Diagnosis not present

## 2019-05-08 DIAGNOSIS — M1711 Unilateral primary osteoarthritis, right knee: Secondary | ICD-10-CM | POA: Diagnosis not present

## 2019-05-08 DIAGNOSIS — E1140 Type 2 diabetes mellitus with diabetic neuropathy, unspecified: Secondary | ICD-10-CM | POA: Diagnosis not present

## 2019-05-08 DIAGNOSIS — E1165 Type 2 diabetes mellitus with hyperglycemia: Secondary | ICD-10-CM | POA: Diagnosis not present

## 2019-05-08 DIAGNOSIS — I10 Essential (primary) hypertension: Secondary | ICD-10-CM | POA: Diagnosis not present

## 2019-05-20 DIAGNOSIS — E039 Hypothyroidism, unspecified: Secondary | ICD-10-CM | POA: Diagnosis not present

## 2019-05-20 DIAGNOSIS — E7800 Pure hypercholesterolemia, unspecified: Secondary | ICD-10-CM | POA: Diagnosis not present

## 2019-05-20 DIAGNOSIS — I2510 Atherosclerotic heart disease of native coronary artery without angina pectoris: Secondary | ICD-10-CM | POA: Diagnosis not present

## 2019-05-20 DIAGNOSIS — E1165 Type 2 diabetes mellitus with hyperglycemia: Secondary | ICD-10-CM | POA: Diagnosis not present

## 2019-05-20 DIAGNOSIS — E669 Obesity, unspecified: Secondary | ICD-10-CM | POA: Diagnosis not present

## 2019-05-20 DIAGNOSIS — Z794 Long term (current) use of insulin: Secondary | ICD-10-CM | POA: Diagnosis not present

## 2019-05-20 DIAGNOSIS — I10 Essential (primary) hypertension: Secondary | ICD-10-CM | POA: Diagnosis not present

## 2019-05-23 DIAGNOSIS — E1165 Type 2 diabetes mellitus with hyperglycemia: Secondary | ICD-10-CM | POA: Diagnosis not present

## 2019-05-23 DIAGNOSIS — I2510 Atherosclerotic heart disease of native coronary artery without angina pectoris: Secondary | ICD-10-CM | POA: Diagnosis not present

## 2019-05-23 DIAGNOSIS — E039 Hypothyroidism, unspecified: Secondary | ICD-10-CM | POA: Diagnosis not present

## 2019-05-23 DIAGNOSIS — I10 Essential (primary) hypertension: Secondary | ICD-10-CM | POA: Diagnosis not present

## 2019-05-23 DIAGNOSIS — E1140 Type 2 diabetes mellitus with diabetic neuropathy, unspecified: Secondary | ICD-10-CM | POA: Diagnosis not present

## 2019-05-23 DIAGNOSIS — M1711 Unilateral primary osteoarthritis, right knee: Secondary | ICD-10-CM | POA: Diagnosis not present

## 2019-05-23 DIAGNOSIS — M5136 Other intervertebral disc degeneration, lumbar region: Secondary | ICD-10-CM | POA: Diagnosis not present

## 2019-05-23 DIAGNOSIS — M5137 Other intervertebral disc degeneration, lumbosacral region: Secondary | ICD-10-CM | POA: Diagnosis not present

## 2019-05-23 DIAGNOSIS — H3530 Unspecified macular degeneration: Secondary | ICD-10-CM | POA: Diagnosis not present

## 2019-05-28 ENCOUNTER — Ambulatory Visit: Payer: Medicare HMO | Admitting: Podiatry

## 2019-06-04 DIAGNOSIS — E039 Hypothyroidism, unspecified: Secondary | ICD-10-CM | POA: Diagnosis not present

## 2019-06-04 DIAGNOSIS — I2510 Atherosclerotic heart disease of native coronary artery without angina pectoris: Secondary | ICD-10-CM | POA: Diagnosis not present

## 2019-06-04 DIAGNOSIS — M5136 Other intervertebral disc degeneration, lumbar region: Secondary | ICD-10-CM | POA: Diagnosis not present

## 2019-06-04 DIAGNOSIS — E1140 Type 2 diabetes mellitus with diabetic neuropathy, unspecified: Secondary | ICD-10-CM | POA: Diagnosis not present

## 2019-06-04 DIAGNOSIS — H3530 Unspecified macular degeneration: Secondary | ICD-10-CM | POA: Diagnosis not present

## 2019-06-04 DIAGNOSIS — I10 Essential (primary) hypertension: Secondary | ICD-10-CM | POA: Diagnosis not present

## 2019-06-04 DIAGNOSIS — M5137 Other intervertebral disc degeneration, lumbosacral region: Secondary | ICD-10-CM | POA: Diagnosis not present

## 2019-06-04 DIAGNOSIS — M1711 Unilateral primary osteoarthritis, right knee: Secondary | ICD-10-CM | POA: Diagnosis not present

## 2019-06-04 DIAGNOSIS — E1165 Type 2 diabetes mellitus with hyperglycemia: Secondary | ICD-10-CM | POA: Diagnosis not present

## 2019-06-24 ENCOUNTER — Other Ambulatory Visit: Payer: Self-pay | Admitting: Internal Medicine

## 2019-06-24 DIAGNOSIS — Z1231 Encounter for screening mammogram for malignant neoplasm of breast: Secondary | ICD-10-CM

## 2019-06-30 ENCOUNTER — Other Ambulatory Visit: Payer: Self-pay

## 2019-06-30 ENCOUNTER — Ambulatory Visit
Admission: RE | Admit: 2019-06-30 | Discharge: 2019-06-30 | Disposition: A | Discharge status: Home / Self Care | Payer: Medicaid Other | Source: Ambulatory Visit | Attending: Internal Medicine | Admitting: Internal Medicine

## 2019-06-30 DIAGNOSIS — Z1231 Encounter for screening mammogram for malignant neoplasm of breast: Secondary | ICD-10-CM | POA: Diagnosis not present

## 2019-07-22 DIAGNOSIS — I2510 Atherosclerotic heart disease of native coronary artery without angina pectoris: Secondary | ICD-10-CM | POA: Diagnosis not present

## 2019-07-22 DIAGNOSIS — Z794 Long term (current) use of insulin: Secondary | ICD-10-CM | POA: Diagnosis not present

## 2019-07-22 DIAGNOSIS — E669 Obesity, unspecified: Secondary | ICD-10-CM | POA: Diagnosis not present

## 2019-07-22 DIAGNOSIS — E1165 Type 2 diabetes mellitus with hyperglycemia: Secondary | ICD-10-CM | POA: Diagnosis not present

## 2019-07-22 DIAGNOSIS — E7800 Pure hypercholesterolemia, unspecified: Secondary | ICD-10-CM | POA: Diagnosis not present

## 2019-07-22 DIAGNOSIS — E039 Hypothyroidism, unspecified: Secondary | ICD-10-CM | POA: Diagnosis not present

## 2019-07-22 DIAGNOSIS — E1140 Type 2 diabetes mellitus with diabetic neuropathy, unspecified: Secondary | ICD-10-CM | POA: Diagnosis not present

## 2019-07-22 DIAGNOSIS — I10 Essential (primary) hypertension: Secondary | ICD-10-CM | POA: Diagnosis not present

## 2019-09-19 ENCOUNTER — Ambulatory Visit: Payer: Medicare HMO | Attending: Internal Medicine

## 2019-09-19 DIAGNOSIS — Z20822 Contact with and (suspected) exposure to covid-19: Secondary | ICD-10-CM

## 2019-09-19 DIAGNOSIS — Z20828 Contact with and (suspected) exposure to other viral communicable diseases: Secondary | ICD-10-CM | POA: Diagnosis not present

## 2019-09-20 LAB — NOVEL CORONAVIRUS, NAA: SARS-CoV-2, NAA: NOT DETECTED

## 2019-10-22 ENCOUNTER — Ambulatory Visit: Payer: Medicare HMO | Admitting: Podiatry

## 2019-10-24 ENCOUNTER — Encounter: Payer: Self-pay | Admitting: Podiatry

## 2019-10-24 ENCOUNTER — Ambulatory Visit (INDEPENDENT_AMBULATORY_CARE_PROVIDER_SITE_OTHER): Payer: Medicare HMO | Admitting: Podiatry

## 2019-10-24 DIAGNOSIS — M79676 Pain in unspecified toe(s): Secondary | ICD-10-CM | POA: Diagnosis not present

## 2019-10-24 DIAGNOSIS — B351 Tinea unguium: Secondary | ICD-10-CM

## 2019-10-24 DIAGNOSIS — E119 Type 2 diabetes mellitus without complications: Secondary | ICD-10-CM | POA: Diagnosis not present

## 2019-10-24 NOTE — Progress Notes (Signed)
Complaint:  Visit Type: Patient returns to my office for continued preventative foot care services. Complaint: Patient states" my nails have grown long and thick and become painful to walk and wear shoes" Patient has been diagnosed with DM with no foot complications. The patient presents for preventative foot care services. No changes to ROS.  Patient requests no dremel usage.  Podiatric Exam: Vascular: dorsalis pedis and posterior tibial pulses are palpable bilateral. Capillary return is immediate. Temperature gradient is WNL. Skin turgor WNL  Sensorium: Normal Semmes Weinstein monofilament test. Normal tactile sensation bilaterally. Nail Exam: Pt has thick disfigured discolored nails with subungual debris noted 1,2,3  B/L Ulcer Exam: There is no evidence of ulcer or pre-ulcerative changes or infection. Orthopedic Exam: Muscle tone and strength are WNL. No limitations in general ROM. No crepitus or effusions noted. Foot type and digits show no abnormalities. Bony prominences are unremarkable. Skin: No Porokeratosis. No infection or ulcers  Diagnosis:  Onychomycosis, , Pain in right toe, pain in left toes  Treatment & Plan Procedures and Treatment: Consent by patient was obtained for treatment procedures.   Debridement of mycotic and hypertrophic toenails, 1 through 5 bilateral and clearing of subungual debris. No ulceration, no infection noted. No dremel used for this patient. Return Visit-Office Procedure: Patient instructed to return to the office for a follow up visit 3 months for continued evaluation and treatment.    Helane Gunther DPM

## 2019-10-28 DIAGNOSIS — E039 Hypothyroidism, unspecified: Secondary | ICD-10-CM | POA: Diagnosis not present

## 2019-10-28 DIAGNOSIS — I10 Essential (primary) hypertension: Secondary | ICD-10-CM | POA: Diagnosis not present

## 2019-10-28 DIAGNOSIS — Z794 Long term (current) use of insulin: Secondary | ICD-10-CM | POA: Diagnosis not present

## 2019-10-28 DIAGNOSIS — E7800 Pure hypercholesterolemia, unspecified: Secondary | ICD-10-CM | POA: Diagnosis not present

## 2019-10-28 DIAGNOSIS — Z0001 Encounter for general adult medical examination with abnormal findings: Secondary | ICD-10-CM | POA: Diagnosis not present

## 2019-10-28 DIAGNOSIS — Z23 Encounter for immunization: Secondary | ICD-10-CM | POA: Diagnosis not present

## 2019-10-28 DIAGNOSIS — E1140 Type 2 diabetes mellitus with diabetic neuropathy, unspecified: Secondary | ICD-10-CM | POA: Diagnosis not present

## 2019-10-28 DIAGNOSIS — E1165 Type 2 diabetes mellitus with hyperglycemia: Secondary | ICD-10-CM | POA: Diagnosis not present

## 2019-10-28 DIAGNOSIS — Z0101 Encounter for examination of eyes and vision with abnormal findings: Secondary | ICD-10-CM | POA: Diagnosis not present

## 2019-11-10 ENCOUNTER — Encounter (HOSPITAL_COMMUNITY): Payer: Self-pay

## 2019-11-10 ENCOUNTER — Other Ambulatory Visit: Payer: Self-pay

## 2019-11-10 ENCOUNTER — Emergency Department (HOSPITAL_COMMUNITY): Payer: Medicare HMO

## 2019-11-10 ENCOUNTER — Emergency Department (HOSPITAL_COMMUNITY)
Admission: EM | Admit: 2019-11-10 | Discharge: 2019-11-10 | Disposition: A | Discharge status: Home / Self Care | Payer: Medicare HMO | Attending: Emergency Medicine | Admitting: Emergency Medicine

## 2019-11-10 DIAGNOSIS — E876 Hypokalemia: Secondary | ICD-10-CM | POA: Insufficient documentation

## 2019-11-10 DIAGNOSIS — R231 Pallor: Secondary | ICD-10-CM | POA: Diagnosis not present

## 2019-11-10 DIAGNOSIS — E162 Hypoglycemia, unspecified: Secondary | ICD-10-CM

## 2019-11-10 DIAGNOSIS — I10 Essential (primary) hypertension: Secondary | ICD-10-CM | POA: Diagnosis not present

## 2019-11-10 DIAGNOSIS — R0902 Hypoxemia: Secondary | ICD-10-CM | POA: Diagnosis not present

## 2019-11-10 DIAGNOSIS — E161 Other hypoglycemia: Secondary | ICD-10-CM | POA: Diagnosis not present

## 2019-11-10 DIAGNOSIS — Z87891 Personal history of nicotine dependence: Secondary | ICD-10-CM | POA: Diagnosis not present

## 2019-11-10 DIAGNOSIS — E11649 Type 2 diabetes mellitus with hypoglycemia without coma: Secondary | ICD-10-CM | POA: Insufficient documentation

## 2019-11-10 DIAGNOSIS — Z794 Long term (current) use of insulin: Secondary | ICD-10-CM | POA: Diagnosis not present

## 2019-11-10 DIAGNOSIS — Z79899 Other long term (current) drug therapy: Secondary | ICD-10-CM | POA: Diagnosis not present

## 2019-11-10 DIAGNOSIS — R42 Dizziness and giddiness: Secondary | ICD-10-CM | POA: Diagnosis not present

## 2019-11-10 DIAGNOSIS — R55 Syncope and collapse: Secondary | ICD-10-CM | POA: Diagnosis not present

## 2019-11-10 LAB — CBG MONITORING, ED
Glucose-Capillary: 139 mg/dL — ABNORMAL HIGH (ref 70–99)
Glucose-Capillary: 108 mg/dL — ABNORMAL HIGH (ref 70–99)

## 2019-11-10 LAB — BASIC METABOLIC PANEL
Anion gap: 12 (ref 5–15)
BUN: 16 mg/dL (ref 8–23)
CO2: 27 mmol/L (ref 22–32)
Calcium: 9.3 mg/dL (ref 8.9–10.3)
Chloride: 102 mmol/L (ref 98–111)
Creatinine, Ser: 0.76 mg/dL (ref 0.44–1.00)
GFR calc Af Amer: >60 mL/min (ref >60)
GFR calc non Af Amer: >60 mL/min (ref >60)
Glucose, Bld: 147 mg/dL — ABNORMAL HIGH (ref 70–99)
Potassium: 2.8 mmol/L — ABNORMAL LOW (ref 3.5–5.1)
Sodium: 141 mmol/L (ref 135–145)

## 2019-11-10 LAB — CBC WITH DIFFERENTIAL/PLATELET
Abs Immature Granulocytes: 0.03 10*3/uL (ref 0.00–0.07)
Basophils Absolute: 0 10*3/uL (ref 0.0–0.1)
Basophils Relative: 0 %
Eosinophils Absolute: 0.2 10*3/uL (ref 0.0–0.5)
Eosinophils Relative: 2 %
HCT: 35.6 % — ABNORMAL LOW (ref 36.0–46.0)
Hemoglobin: 11.3 g/dL — ABNORMAL LOW (ref 12.0–15.0)
Immature Granulocytes: 0 %
Lymphocytes Relative: 23 %
Lymphs Abs: 1.9 10*3/uL (ref 0.7–4.0)
MCH: 29.2 pg (ref 26.0–34.0)
MCHC: 31.7 g/dL (ref 30.0–36.0)
MCV: 92 fL (ref 80.0–100.0)
Monocytes Absolute: 0.7 10*3/uL (ref 0.1–1.0)
Monocytes Relative: 8 %
Neutro Abs: 5.5 10*3/uL (ref 1.7–7.7)
Neutrophils Relative %: 67 %
Platelets: 215 10*3/uL (ref 150–400)
RBC: 3.87 MIL/uL (ref 3.87–5.11)
RDW: 14.5 % (ref 11.5–15.5)
WBC: 8.3 10*3/uL (ref 4.0–10.5)
nRBC: 0 % (ref 0.0–0.2)

## 2019-11-10 LAB — MAGNESIUM: Magnesium: 1.8 mg/dL (ref 1.7–2.4)

## 2019-11-10 MED ORDER — POTASSIUM CHLORIDE CRYS ER 20 MEQ PO TBCR
60.0000 meq | EXTENDED_RELEASE_TABLET | Freq: Once | ORAL | Status: AC
  Administered 2019-11-10: 16:00:00 60 meq via ORAL
  Filled 2019-11-10: qty 3

## 2019-11-10 MED ORDER — POTASSIUM CHLORIDE ER 10 MEQ PO TBCR: 40.0000 meq | EXTENDED_RELEASE_TABLET | Freq: Every day | ORAL | 0 refills | Status: DC

## 2019-11-10 NOTE — ED Triage Notes (Signed)
Pt arrived via [facility] from home. Initial call was for low blood sugar. Pt's CBG was 60. Pt drunk some lemonade and started to feel funny. Pt stated she became dizzy, nauseous and vomited. Pt's CBG was then 158 per EMS. Pt denies any pain. Pt received 300 NS from EMS.

## 2019-11-10 NOTE — ED Notes (Signed)
Pt ambulated with assistance of walker, as she uses a walker at home. Pt stated she did not have any issues walking and walked normally as she does at home. Pt reported no SHOB, lightheadedness, or dizziness.

## 2019-11-10 NOTE — Discharge Instructions (Addendum)
Have your primary care provider recheck your potassium level in 1 week. Please make sure you check your blood sugars on a regular basis. Return to the ED if you start to develop chest pain, shortness of breath, numbness in arms or legs, injuries or falls.

## 2019-11-10 NOTE — ED Notes (Signed)
[phone] Pearly daughter

## 2019-11-10 NOTE — ED Provider Notes (Signed)
[facility] EMERGENCY DEPARTMENT Provider Note   CSN: [id number] Arrival date & time: 11/10/19  1241     History Chief Complaint  Patient presents with  . Near Syncope    Jennifer Zhang is a 86 y.o. female with a past medical history of IDDM, hypertension presenting to the ED for dizziness, lightheadedness, nausea.  States that she had a "spell" of the symptoms just prior to arrival with one episode of nonbloody, nonbilious emesis.  She reports several other "spells" similar to this 1 over the past several months which spontaneously resolved.  She woke up this morning, checked her blood sugars that were in the 100s (does not remember the exact value) and took 25 units of her NovoLog 70/30. She drank some lemonade and then began having lightheadedness, dizziness.  She was found to have CBG in the 60s.  She reports a "funny feeling" in her head but denies any headache, vision changes, abdominal pain, chest pain, shortness of breath, numbness in arms or legs, syncope, injuries or falls. Her dizziness and nausea symptoms have resolved.  HPI     Past Medical History:  Diagnosis Date  . Diabetes mellitus without complication ([place])   . Hypertension   . Thyroid disease     Patient Active Problem List   Diagnosis Date Noted  . Chronic swimmer's ear of both sides 12/05/2018  . Impacted cerumen of both ears 12/05/2018  . Sensorineural hearing loss (SNHL), bilateral 12/05/2018    Past Surgical History:  Procedure Laterality Date  . ABDOMINAL HYSTERECTOMY    . FOOT SURGERY Right 1960     OB History   No obstetric history on file.     Family History  Problem Relation Age of Onset  . Cancer Mother   . Hypertension Mother   . Migraines Mother   . Breast cancer Mother   . Diabetes Brother     Social History   Tobacco Use  . Smoking status: Former Research scientist (life sciences)  . Smokeless tobacco: Former Network engineer Use Topics  . Alcohol use: No  . Drug use: No    Home  Medications Prior to Admission medications   Medication Sig Start Date End Date Taking? Authorizing Provider  Accu-Chek FastClix Lancets MISC  09/19/18   [provider]  ACCU-CHEK GUIDE test strip USE TO TEST 2 3 TIMES PER DAY 02/01/19   [provider]  Alcohol Swabs (ALCOHOL PREP) PADS  06/04/18   [provider]  Artificial Saliva (EQL DRY MOUTH ORAL RINSE MT) Dry Mouth Oral Rinse 06/04/18   [provider]  atorvastatin (LIPITOR) 40 MG tablet Take 40 mg by mouth at bedtime.  04/01/18   [provider]  B-D UF III MINI PEN NEEDLES 31G X 5 MM MISC See admin instructions. 05/29/18   [provider]  BD INSULIN SYRINGE U/F 31G X 5/16" 0.5 ML MISC  10/31/18   [provider]  Blood Glucose Monitoring Suppl (ACCU-CHEK GUIDE) w/Device KIT  09/19/18   [provider]  gabapentin (NEURONTIN) 300 MG capsule Take 300 mg by mouth 2 (two) times daily. 04/03/18   [provider]  Incontinence Supply Disposable (CLEANSING CLOTHS FLUSHABLE) MISC  06/04/18   [provider]  insulin aspart protamine - aspart (NOVOLOG MIX 70/30 FLEXPEN) (70-30) 100 UNIT/ML FlexPen 25 UNITS 2 TIMES DAILY SUBCUTANEOUSLY 30 DAYS 11/12/18   [provider]  isosorbide mononitrate (ISMO,MONOKET) 10 MG tablet Take 10 mg by mouth 2 (two) times daily.  05/23/18   [provider]  levothyroxine (SYNTHROID) 50 MCG tablet  11/07/18   [provider]  lisinopril-hydrochlorothiazide (ZESTORETIC) 20-25 MG tablet  11/23/18   [provider]  nitroGLYCERIN (NITROSTAT) 0.4 MG SL tablet 1 TAB(S) EVERY 5 MINUTES AS NEEDED SUBLINGUALLY 04/03/18   [provider]  Olopatadine HCl 0.2 % SOLN Place 1 drop into both eyes daily.  06/01/18   [provider]  potassium chloride (KLOR-CON M10) 10 MEQ tablet  11/23/18   [provider]  potassium chloride (KLOR-CON) 10 MEQ tablet Take 4 tablets (40 mEq total) by mouth daily for  4 days. 11/10/19 11/14/19  Clovia Reine, Nicanor Alcon, PA-C  UNABLE TO FIND Roll  - on Muscle relief 06/04/18   [provider]  Karen Kays TO [facility] 06/04/18   [provider]  UNABLE TO FIND MUSCLE RUB 06/04/18   [provider]  UNABLE TO [place] 06/04/18   [provider]  UNABLE TO FIND HYDROCORTISONE 1% - 60 GM 06/04/18   [provider]  UNABLE TO [place] 06/04/18   [provider]  UNABLE TO FIND Diabetic Skin Relief 06/04/18   [provider]  UNABLE TO FIND Diabetes  [place] White M 06/04/18   [provider]  UNABLE TO FIND Diabetes  [place] Black M 06/04/18   [provider]  verapamil (VERELAN PM) 240 MG 24 hr capsule  06/29/18   [provider]    Allergies    Tape and Aspirin  Review of Systems   Review of Systems  Constitutional: Negative for appetite change, chills and fever.  HENT: Negative for ear pain, rhinorrhea, sneezing and sore throat.   Eyes: Negative for photophobia and visual disturbance.  Respiratory: Negative for cough, chest tightness, shortness of breath and wheezing.   Cardiovascular: Negative for chest pain and palpitations.  Gastrointestinal: Positive for vomiting. Negative for abdominal pain, blood in stool, constipation, diarrhea and nausea.  Genitourinary: Negative for dysuria, hematuria and urgency.  Musculoskeletal: Negative for myalgias.  Skin: Negative for rash.  Neurological: Positive for dizziness and light-headedness. Negative for weakness.    Physical Exam Updated Vital Signs BP 125/74   Pulse (!) 58   Temp (!) 97.4 F (36.3 C) (Oral)   Resp 16   SpO2 100%   Physical Exam Vitals and nursing note reviewed.  Constitutional:      General: She is not in acute distress.    Appearance: She is well-developed.  HENT:     Head: Normocephalic and atraumatic.     Nose: Nose normal.  Eyes:     General: No scleral  icterus.       Left eye: No discharge.     Conjunctiva/sclera: Conjunctivae normal.  Cardiovascular:     Rate and Rhythm: Normal rate and regular rhythm.     Heart sounds: Normal heart sounds. No murmur. No friction rub. No gallop.   Pulmonary:     Effort: Pulmonary effort is normal. No respiratory distress.     Breath sounds: Normal breath sounds.  Abdominal:     General: Bowel sounds are normal. There is no distension.     Palpations: Abdomen is soft.     Tenderness: There is no abdominal tenderness. There is no guarding.  Musculoskeletal:        General: Normal range of motion.     Cervical back: Normal range of motion and neck supple.  Skin:  General: Skin is warm and dry.     Findings: No rash.  Neurological:     General: No focal deficit present.     Mental Status: She is alert and oriented to person, place, and time.     Cranial Nerves: No cranial nerve deficit.     Sensory: No sensory deficit.     Motor: No weakness or abnormal muscle tone.     Coordination: Coordination normal.     Comments: Pupils reactive. No facial asymmetry noted. Cranial nerves appear grossly intact. Sensation intact to light touch on face, BUE and BLE. Strength 5/5 in BUE and BLE. Alert and oriented x3.     ED Results / Procedures / Treatments   Labs (all labs ordered are listed, but only abnormal results are displayed) Labs Reviewed  BASIC METABOLIC PANEL - Abnormal; Notable for the following components:      Result Value   Potassium 2.8 (*)    Glucose, Bld 147 (*)    All other components within normal limits  CBC WITH DIFFERENTIAL/PLATELET - Abnormal; Notable for the following components:   Hemoglobin 11.3 (*)    HCT 35.6 (*)    All other components within normal limits  CBG MONITORING, ED - Abnormal; Notable for the following components:   Glucose-Capillary 139 (*)    All other components within normal limits  CBG MONITORING, ED - Abnormal; Notable for the following components:    Glucose-Capillary 108 (*)    All other components within normal limits  MAGNESIUM    EKG EKG Interpretation  Date/Time:  Monday November 10 2019 13:10:52 EST Ventricular Rate:  52 PR Interval:    QRS Duration: 83 QT Interval:  503 QTC Calculation: 468 R Axis:   -13 Text Interpretation: Sinus rhythm Abnormal R-wave progression, early transition Left ventricular hypertrophy Borderline ST elevation, lateral leads No significant change since last tracing Confirmed by Blanchie Dessert [phone]) on 11/10/2019 1:22:52 PM   Radiology DG Chest 2 View  Result Date: 11/10/2019 CLINICAL DATA:  Near syncope EXAM: CHEST - 2 VIEW COMPARISON:  09/10/2014 FINDINGS: Normal cardiac silhouette. No effusion, infiltrate, pneumothorax. Low lung volumes. Degenerate changes shoulders. IMPRESSION: No acute cardiopulmonary process. Electronically Signed   By: Suzy Bouchard M.D.   On: 11/10/2019 14:31    Procedures Procedures (including critical care time)  Medications Ordered in ED Medications  potassium chloride SA (KLOR-CON) CR tablet 60 mEq (60 mEq Oral Given 11/10/19 1546)    ED Course  I have reviewed the triage vital signs and the nursing notes.  Pertinent labs & imaging results that were available during my care of the patient were reviewed by me and considered in my medical decision making (see chart for details).  Clinical Course as of Nov 09 1617  Mon Nov 10, 2019  1541 Will replete orally.  Potassium(!): 2.8 [HK]  1548 Oxygen saturations 99% on room air.   [HK]  8786 Will attempt to ambulate patient.  States that she ambulates at home with a walker but "I don't really walk that much because I'm unsteady, but that's been going on for so long and I've told my doctor. I just don't walk that much."   [HK]    Clinical Course User Index [HK] Delia Heady, PA-C   MDM Rules/Calculators/A&P                      86 year old female with a past medical history of IDDM, hypertension presenting to  the ED for  dizziness, lightheadedness and nausea.  Symptoms began after she took 25 units of her NovoLog 70/30 this morning.  Her blood sugars this morning were in the 100s before she took the insulin.  She does endorse similar episodes like this over the past year without specific aggravating or alleviating factor.  She denies any injuries or falls, numbness in arms or legs, headache or blurry vision, chest pain.  She denies any new medication adjustments.  On exam patient is overall well-appearing.  She is alert and oriented x3.  No deficits neurological exam noted.  EKG without changes from prior tracings.  Chest x-ray is unremarkable.  Lab work including CBC and magnesium unremarkable.  BMP with hypokalemia of 2.8 which was repleted orally.  Initial note states that patient had oxygen saturations of 90% on room air.  However I have placed her off of the supplemental oxygen she continues to maintain oxygen saturations above 99%.  She has no signs of respiratory distress and denies any shortness of breath. Patient ambulated without difficulty and feels "like I normally do when I am at home."  Denies any chest pain, dizziness.  Suspect that her symptoms could be due to a combination of hypoglycemia and hypokalemia.  I doubt ACS, CVA or other emergent cause of her symptoms although these were all considered.  She is requesting discharge home which I feel is reasonable. Will have her take PO potassium and have PCP recheck potassium levels.  Patient is hemodynamically stable, in NAD, and able to ambulate in the ED. Evaluation does not show pathology that would require ongoing emergent intervention or inpatient treatment. I explained the diagnosis to the patient. Pain has been managed and has no complaints prior to discharge. Patient is comfortable with above plan and is stable for discharge at this time. All questions were answered prior to disposition. Strict return precautions for returning to the ED were  discussed. Encouraged follow up with PCP.   An After Visit Summary was printed and given to the patient.   Portions of this note were generated with [employer]. Dictation errors may occur despite best attempts at proofreading.   Final Clinical Impression(s) / ED Diagnoses Final diagnoses:  Hypokalemia  Hypoglycemia    Rx / DC Orders ED Discharge Orders         Ordered    potassium chloride (KLOR-CON) 10 MEQ tablet  Daily     11/10/19 [address], PA-C 11/10/19 1619    Blanchie Dessert, MD 11/11/19 [phone]

## 2019-11-10 NOTE — ED Notes (Signed)
Pt sating 90-91% on RA. Pt placed on 2L [place] with O2 sat now 98-100%.

## 2019-11-16 ENCOUNTER — Ambulatory Visit: Payer: Medicaid Other

## 2019-12-16 DIAGNOSIS — E1165 Type 2 diabetes mellitus with hyperglycemia: Secondary | ICD-10-CM | POA: Diagnosis not present

## 2019-12-16 DIAGNOSIS — E1140 Type 2 diabetes mellitus with diabetic neuropathy, unspecified: Secondary | ICD-10-CM | POA: Diagnosis not present

## 2019-12-16 DIAGNOSIS — E7800 Pure hypercholesterolemia, unspecified: Secondary | ICD-10-CM | POA: Diagnosis not present

## 2019-12-16 DIAGNOSIS — Z794 Long term (current) use of insulin: Secondary | ICD-10-CM | POA: Diagnosis not present

## 2019-12-16 DIAGNOSIS — I10 Essential (primary) hypertension: Secondary | ICD-10-CM | POA: Diagnosis not present

## 2019-12-16 DIAGNOSIS — I2510 Atherosclerotic heart disease of native coronary artery without angina pectoris: Secondary | ICD-10-CM | POA: Diagnosis not present

## 2019-12-16 DIAGNOSIS — E039 Hypothyroidism, unspecified: Secondary | ICD-10-CM | POA: Diagnosis not present

## 2019-12-21 ENCOUNTER — Other Ambulatory Visit: Payer: Self-pay

## 2019-12-21 ENCOUNTER — Observation Stay (HOSPITAL_COMMUNITY)
Admission: EM | Admit: 2019-12-21 | Discharge: 2019-12-22 | Disposition: A | Discharge status: Home / Self Care | Payer: Medicare HMO | Attending: Family Medicine | Admitting: Family Medicine

## 2019-12-21 ENCOUNTER — Encounter (HOSPITAL_COMMUNITY): Payer: Self-pay | Admitting: Emergency Medicine

## 2019-12-21 DIAGNOSIS — R55 Syncope and collapse: Principal | ICD-10-CM

## 2019-12-21 DIAGNOSIS — Z03818 Encounter for observation for suspected exposure to other biological agents ruled out: Secondary | ICD-10-CM | POA: Diagnosis not present

## 2019-12-21 DIAGNOSIS — Z79899 Other long term (current) drug therapy: Secondary | ICD-10-CM | POA: Insufficient documentation

## 2019-12-21 DIAGNOSIS — R42 Dizziness and giddiness: Secondary | ICD-10-CM | POA: Insufficient documentation

## 2019-12-21 DIAGNOSIS — R2681 Unsteadiness on feet: Secondary | ICD-10-CM | POA: Diagnosis not present

## 2019-12-21 DIAGNOSIS — Z79890 Hormone replacement therapy (postmenopausal): Secondary | ICD-10-CM | POA: Insufficient documentation

## 2019-12-21 DIAGNOSIS — M6281 Muscle weakness (generalized): Secondary | ICD-10-CM | POA: Diagnosis not present

## 2019-12-21 DIAGNOSIS — I10 Essential (primary) hypertension: Secondary | ICD-10-CM

## 2019-12-21 DIAGNOSIS — E039 Hypothyroidism, unspecified: Secondary | ICD-10-CM

## 2019-12-21 DIAGNOSIS — E785 Hyperlipidemia, unspecified: Secondary | ICD-10-CM | POA: Insufficient documentation

## 2019-12-21 DIAGNOSIS — H903 Sensorineural hearing loss, bilateral: Secondary | ICD-10-CM | POA: Diagnosis not present

## 2019-12-21 DIAGNOSIS — N390 Urinary tract infection, site not specified: Secondary | ICD-10-CM | POA: Diagnosis not present

## 2019-12-21 DIAGNOSIS — Z87891 Personal history of nicotine dependence: Secondary | ICD-10-CM | POA: Diagnosis not present

## 2019-12-21 DIAGNOSIS — R531 Weakness: Secondary | ICD-10-CM | POA: Diagnosis not present

## 2019-12-21 DIAGNOSIS — G629 Polyneuropathy, unspecified: Secondary | ICD-10-CM | POA: Diagnosis not present

## 2019-12-21 DIAGNOSIS — E119 Type 2 diabetes mellitus without complications: Secondary | ICD-10-CM

## 2019-12-21 DIAGNOSIS — Z20822 Contact with and (suspected) exposure to covid-19: Secondary | ICD-10-CM | POA: Diagnosis not present

## 2019-12-21 DIAGNOSIS — N3000 Acute cystitis without hematuria: Secondary | ICD-10-CM | POA: Diagnosis present

## 2019-12-21 DIAGNOSIS — R110 Nausea: Secondary | ICD-10-CM | POA: Diagnosis not present

## 2019-12-21 DIAGNOSIS — R0902 Hypoxemia: Secondary | ICD-10-CM | POA: Diagnosis not present

## 2019-12-21 DIAGNOSIS — Z794 Long term (current) use of insulin: Secondary | ICD-10-CM | POA: Diagnosis not present

## 2019-12-21 DIAGNOSIS — E1165 Type 2 diabetes mellitus with hyperglycemia: Secondary | ICD-10-CM | POA: Diagnosis not present

## 2019-12-21 LAB — CBC WITH DIFFERENTIAL/PLATELET
Abs Immature Granulocytes: 0.03 10*3/uL (ref 0.00–0.07)
Basophils Absolute: 0 10*3/uL (ref 0.0–0.1)
Basophils Relative: 0 %
Eosinophils Absolute: 0.2 10*3/uL (ref 0.0–0.5)
Eosinophils Relative: 2 %
HCT: 34.9 % — ABNORMAL LOW (ref 36.0–46.0)
Hemoglobin: 11.1 g/dL — ABNORMAL LOW (ref 12.0–15.0)
Immature Granulocytes: 0 %
Lymphocytes Relative: 17 %
Lymphs Abs: 1.6 10*3/uL (ref 0.7–4.0)
MCH: 29.2 pg (ref 26.0–34.0)
MCHC: 31.8 g/dL (ref 30.0–36.0)
MCV: 91.8 fL (ref 80.0–100.0)
Monocytes Absolute: 0.7 10*3/uL (ref 0.1–1.0)
Monocytes Relative: 7 %
Neutro Abs: 7.2 10*3/uL (ref 1.7–7.7)
Neutrophils Relative %: 74 %
Platelets: 215 10*3/uL (ref 150–400)
RBC: 3.8 MIL/uL — ABNORMAL LOW (ref 3.87–5.11)
RDW: 14.6 % (ref 11.5–15.5)
WBC: 9.7 10*3/uL (ref 4.0–10.5)
nRBC: 0 % (ref 0.0–0.2)

## 2019-12-21 LAB — COMPREHENSIVE METABOLIC PANEL
ALT: 16 U/L (ref 0–44)
AST: 17 U/L (ref 15–41)
Albumin: 3.8 g/dL (ref 3.5–5.0)
Alkaline Phosphatase: 54 U/L (ref 38–126)
Anion gap: 10 (ref 5–15)
BUN: 23 mg/dL (ref 8–23)
CO2: 28 mmol/L (ref 22–32)
Calcium: 9.2 mg/dL (ref 8.9–10.3)
Chloride: 103 mmol/L (ref 98–111)
Creatinine, Ser: 0.82 mg/dL (ref 0.44–1.00)
GFR calc Af Amer: >60 mL/min (ref >60)
GFR calc non Af Amer: >60 mL/min (ref >60)
Glucose, Bld: 173 mg/dL — ABNORMAL HIGH (ref 70–99)
Potassium: 3.6 mmol/L (ref 3.5–5.1)
Sodium: 141 mmol/L (ref 135–145)
Total Bilirubin: 0.3 mg/dL (ref 0.3–1.2)
Total Protein: 7.1 g/dL (ref 6.5–8.1)

## 2019-12-21 LAB — URINALYSIS, ROUTINE W REFLEX MICROSCOPIC
Bacteria, UA: NONE SEEN
Bilirubin Urine: NEGATIVE
Glucose, UA: NEGATIVE mg/dL
Hgb urine dipstick: NEGATIVE
Ketones, ur: NEGATIVE mg/dL
Nitrite: NEGATIVE
Protein, ur: NEGATIVE mg/dL
Specific Gravity, Urine: 1.018 (ref 1.005–1.030)
pH: 6 (ref 5.0–8.0)

## 2019-12-21 LAB — CBG MONITORING, ED
Glucose-Capillary: 119 mg/dL — ABNORMAL HIGH (ref 70–99)
Glucose-Capillary: 167 mg/dL — ABNORMAL HIGH (ref 70–99)

## 2019-12-21 MED ORDER — SODIUM CHLORIDE 0.9 % IV BOLUS
500.0000 mL | Freq: Once | INTRAVENOUS | Status: AC
  Administered 2019-12-21: 16:00:00 500 mL via INTRAVENOUS

## 2019-12-21 MED ORDER — SODIUM CHLORIDE 0.9 % IV SOLN
1.0000 g | Freq: Once | INTRAVENOUS | Status: AC
  Administered 2019-12-21: 20:00:00 1 g via INTRAVENOUS
  Filled 2019-12-21: qty 10

## 2019-12-21 MED ORDER — SODIUM CHLORIDE 0.9 % IV BOLUS: 1000.0000 mL | Freq: Once | INTRAVENOUS | Status: DC

## 2019-12-21 NOTE — H&P (Addendum)
Code status: full Code  PCP:   Benito Mccreedy, MD   Chief Complaint: dizzy, nausea, vomiting   HPI: this is a 86y/o female who presents with c/o feeling presyncopal. She was in the house feeling sleepy, then she got dizzy. She almost fell a times and said she felt presyncopal. She was nauseous, vomiting and diaphoretic. She denies any burning urination. She denies any fevers or chills. Her granddaughter was present and called the ambulance.  The patient denies abdominal pain, diarrhea, chest pain, confusion or headache.  There was concern for hypoglycemia but FSBS was 300 in the ambulance and 110 in ER. The ER physician request observation as patient 86 y/o.  History provided by patient and daughter at bedside.  Review of Systems:  The patient denies anorexia, fever, weight loss,weakness, vision loss, decreased hearing, hoarseness, chest pain, syncope, dyspnea on exertion, peripheral edema, balance deficits, hemoptysis, abdominal pain, nausea, vomiting, dizziness, diaphoresis, melena, hematochezia, severe indigestion/heartburn, hematuria, incontinence, genital sores, muscle weakness, suspicious skin lesions, transient blindness, difficulty walking, depression, unusual weight change, abnormal bleeding, enlarged lymph nodes, angioedema, and breast masses.  Past Medical History: Past Medical History:  Diagnosis Date  . Diabetes mellitus without complication ([place])   . Hypertension   . Thyroid disease    Past Surgical History:  Procedure Laterality Date  . ABDOMINAL HYSTERECTOMY    . FOOT SURGERY Right 1960    Medications: Prior to Admission medications   Medication Sig Start Date End Date Taking? Authorizing Provider  Accu-Chek FastClix Lancets MISC  09/19/18  Yes [provider]  ACCU-CHEK GUIDE test strip 1 each by Other route. 2-3 02/01/19  Yes [provider]  acetaminophen (TYLENOL) 500 MG tablet Take 500-1,000 mg by mouth every 6 (six) hours as needed for  mild pain or headache.   Yes [provider]  Alcohol Swabs (ALCOHOL PREP) PADS  06/04/18  Yes [provider]  Artificial Saliva (EQL DRY MOUTH ORAL RINSE MT) Dry Mouth Oral Rinse 06/04/18  Yes [provider]  atorvastatin (LIPITOR) 40 MG tablet Take 40 mg by mouth at bedtime.  04/01/18  Yes [provider]  B-D UF III MINI PEN NEEDLES 31G X 5 MM MISC See admin instructions. 05/29/18  Yes [provider]  BD INSULIN SYRINGE U/F 31G X 5/16" 0.5 ML MISC  10/31/18  Yes [provider]  Blood Glucose Monitoring Suppl (ACCU-CHEK GUIDE) w/Device KIT  09/19/18  Yes [provider]  Incontinence Supply Disposable (CLEANSING CLOTHS FLUSHABLE) MISC  06/04/18  Yes [provider]  insulin aspart protamine - aspart (NOVOLOG MIX 70/30 FLEXPEN) (70-30) 100 UNIT/ML FlexPen Inject 25 Units into the skin 2 (two) times daily with a meal.  11/12/18  Yes [provider]  isosorbide mononitrate (ISMO,MONOKET) 10 MG tablet Take 10 mg by mouth 2 (two) times daily. 05/23/18  Yes [provider]  levothyroxine (SYNTHROID) 50 MCG tablet Take 50 mcg by mouth daily before breakfast.  11/07/18  Yes [provider]  lisinopril-hydrochlorothiazide (ZESTORETIC) 20-25 MG tablet Take 1 tablet by mouth daily.  11/23/18  Yes [provider]  nitroGLYCERIN (NITROSTAT) 0.4 MG SL tablet Place 0.4 mg under the tongue every 5 (five) minutes as needed for chest pain.  04/03/18  Yes [provider]  Olopatadine HCl 0.2 % SOLN Place 1 drop into both eyes daily.  06/01/18  Yes [provider]  potassium chloride (KLOR-CON M10) 10 MEQ tablet Take 10 mEq by mouth every morning.  11/23/18  Yes  [provider]  UNABLE TO FIND Roll  - on Muscle relief 06/04/18  Yes [provider]  [place] 1% - 60 GM 06/04/18  Yes [provider]  [place] 06/04/18  Yes [provider]  UNABLE TO FIND Diabetic Skin Relief 06/04/18  Yes [provider]  verapamil (CALAN-SR) 240 MG CR tablet Take 240 mg by mouth daily.   Yes [provider]  gabapentin (NEURONTIN) 300 MG capsule Take 300 mg by mouth 2 (two) times daily. 04/03/18   [provider]  potassium chloride (KLOR-CON) 10 MEQ tablet Take 4 tablets (40 mEq total) by mouth daily for 4 days. Patient not taking: Reported on 12/21/2019 11/10/19 11/14/19  Delia Heady, PA-C    Allergies:   Allergies  Allergen Reactions  . Tape Other (See Comments)    SKIN WILL TEAR EASILY!!  . Aspirin Palpitations and Other (See Comments)    Heart flutters    Social History:  reports that she has quit smoking. She has quit using smokeless tobacco. She reports that she does not drink alcohol or use drugs.  Family History: Family History  Problem Relation Age of Onset  . Cancer Mother   . Hypertension Mother   . Migraines Mother   . Breast cancer Mother   . Diabetes Brother     Physical Exam: Vitals:   12/21/19 1645 12/21/19 1730 12/21/19 1745 12/21/19 1800  BP: 128/68 134/66  (!) 120/56  Pulse: 74 (!) 135  74  Resp: '16  18 20  '$ Temp:      TempSrc:      SpO2: 94% 90%  95%  Weight:      Height:        General:  Alert and oriented times three, well developed and nourished, no acute distress, generalized weakness Eyes: PERRLA, pink conjunctiva, no scleral icterus ENT: Moist oral mucosa, neck supple, no thyromegaly Lungs: clear to ascultation, no wheeze, no crackles, no use of accessory muscles Cardiovascular: regular rate and rhythm, no regurgitation, no gallops, no murmurs. No carotid bruits, no JVD Abdomen: soft, positive BS, non-tender, non-distended, no organomegaly, not an acute abdomen GU: not examined Neuro: CN II - XII grossly intact, sensation intact Musculoskeletal: strength 5/5 all extremities, no clubbing, cyanosis or edema Skin: no rash, no subcutaneous crepitation, no  decubitus Psych: appropriate patient   Labs on Admission:  Recent Labs    12/21/19 1623  NA 141  K 3.6  CL 103  CO2 28  GLUCOSE 173*  BUN 23  CREATININE 0.82  CALCIUM 9.2   Recent Labs    12/21/19 1623  AST 17  ALT 16  ALKPHOS 54  BILITOT 0.3  PROT 7.1  ALBUMIN 3.8   No results for input(s): LIPASE, AMYLASE in the last 72 hours. Recent Labs    12/21/19 1623  WBC 9.7  NEUTROABS 7.2  HGB 11.1*  HCT 34.9*  MCV 91.8  PLT 215   No results for input(s): CKTOTAL, CKMB, CKMBINDEX, TROPONINI in the last 72 hours. Invalid input(s): POCBNP No results for input(s): DDIMER in the last 72 hours. No results for input(s): HGBA1C in the last 72 hours. No results for input(s): CHOL, HDL, LDLCALC, TRIG, CHOLHDL, LDLDIRECT in the last 72 hours. No results for input(s): TSH, T4TOTAL, T3FREE, THYROIDAB in the last 72 hours.  Invalid input(s): FREET3 No results for input(s): VITAMINB12, FOLATE, FERRITIN, TIBC, IRON, RETICCTPCT in the last 72 hours.  Micro Results: Results  for FINOLA, ROSAL (MRN [id number]) as of 12/21/2019 18:19  Ref. Range 12/21/2019 16:23  Appearance Latest Ref Range: CLEAR  CLEAR  Bilirubin Urine Latest Ref Range: NEGATIVE  NEGATIVE  Color, Urine Latest Ref Range: YELLOW  YELLOW  Glucose, UA Latest Ref Range: NEGATIVE mg/dL NEGATIVE  Hgb urine dipstick Latest Ref Range: NEGATIVE  NEGATIVE  Ketones, ur Latest Ref Range: NEGATIVE mg/dL NEGATIVE  Leukocytes,Ua Latest Ref Range: NEGATIVE  LARGE (A)  Nitrite Latest Ref Range: NEGATIVE  NEGATIVE  pH Latest Ref Range: 5.0 - 8.0  6.0  Protein Latest Ref Range: NEGATIVE mg/dL NEGATIVE  Specific Gravity, Urine Latest Ref Range: 1.005 - 1.030  1.018  Bacteria, UA Latest Ref Range: NONE SEEN  NONE SEEN  Hyaline Casts, UA Unknown PRESENT  Mucus Unknown PRESENT  Non Squamous Epithelial Latest Ref Range: NONE SEEN  0-5 (A)  RBC / HPF Latest Ref Range: 0 - 5 RBC/hpf 0-5  Squamous Epithelial / LPF Latest Ref Range: 0 -  5  11-20  WBC, UA Latest Ref Range: 0 - 5 WBC/hpf 6-10     Radiological Exams on Admission: No results found.  Assessment/Plan Present on Admission: . Likely acute lower UTI -bring in for overnight obs -blood and urine culture -IV Rocephin -UA with squamous cells, maybe contaminated  HTN -stable, home meds resumed  DM type 2 -stable, home meds resumed -SSI ordered  Hypothyroidism -stable, home meds resumed  Deconditioning -Lightheadedness, dizziness, generalized weakness.  Will consult PT/OT  Jolleen Seman 12/21/2019, 6:23 PM

## 2019-12-21 NOTE — ED Triage Notes (Signed)
BIB EMS from home, C/C dizziness. CBG 326 with EMS, upon attempting to walk to the ambulance, patient had another dizzy spell and one episode of vomiting. EMS gave 350 mL of NS, 20 G L Hand. Denies chest pain, diarrhea.

## 2019-12-21 NOTE — ED Provider Notes (Signed)
[facility] DEPT Provider Note   CSN: [id number] Arrival date & time: 12/21/19  1546     History Chief Complaint  Patient presents with  . Dizziness  . Hyperglycemia    Jennifer Zhang is a 86 y.o. female.  HPI 86 year old female presents with acute dizziness.  She was having dizziness and sweating and felt like when she is had prior hypoglycemia.  She checked her glucose this morning and it was around 100 but did not check it during this episode.  EMS was called but patient states she never passed out.  Never had headache, chest pain, shortness of breath.  She did vomit once with EMS.  They noted her glucose to be around 300 and gave her Zofran and around 300 cc IV fluid.  She is feeling better.  She states she is not really dizzy but does not quite feel back to normal.  No infectious symptoms such as fever or urinary symptoms.  No recent illness.  Reports compliance with her meds.   Past Medical History:  Diagnosis Date  . Diabetes mellitus without complication ([place])   . Hypertension   . Thyroid disease     Patient Active Problem List   Diagnosis Date Noted  . Acute lower UTI 12/21/2019  . Type 2 diabetes mellitus without complication ([place]) 40/98/1191  . HTN (hypertension) 12/21/2019  . Hypothyroidism 12/21/2019  . Acute cystitis 12/21/2019  . Chronic swimmer's ear of both sides 12/05/2018  . Impacted cerumen of both ears 12/05/2018  . Sensorineural hearing loss (SNHL), bilateral 12/05/2018    Past Surgical History:  Procedure Laterality Date  . ABDOMINAL HYSTERECTOMY    . FOOT SURGERY Right 1960     OB History   No obstetric history on file.     Family History  Problem Relation Age of Onset  . Cancer Mother   . Hypertension Mother   . Migraines Mother   . Breast cancer Mother   . Diabetes Brother     Social History   Tobacco Use  . Smoking status: Former Research scientist (life sciences)  . Smokeless tobacco: Former Network engineer Use Topics  .  Alcohol use: No  . Drug use: No    Home Medications Prior to Admission medications   Medication Sig Start Date End Date Taking? Authorizing Provider  Accu-Chek FastClix Lancets MISC  09/19/18  Yes [provider]  ACCU-CHEK GUIDE test strip 1 each by Other route. 2-3 02/01/19  Yes [provider]  acetaminophen (TYLENOL) 500 MG tablet Take 500-1,000 mg by mouth every 6 (six) hours as needed for mild pain or headache.   Yes [provider]  Alcohol Swabs (ALCOHOL PREP) PADS  06/04/18  Yes [provider]  Artificial Saliva (EQL DRY MOUTH ORAL RINSE MT) Dry Mouth Oral Rinse 06/04/18  Yes [provider]  atorvastatin (LIPITOR) 40 MG tablet Take 40 mg by mouth at bedtime.  04/01/18  Yes [provider]  B-D UF III MINI PEN NEEDLES 31G X 5 MM MISC See admin instructions. 05/29/18  Yes [provider]  BD INSULIN SYRINGE U/F 31G X 5/16" 0.5 ML MISC  10/31/18  Yes [provider]  Blood Glucose Monitoring Suppl (ACCU-CHEK GUIDE) w/Device KIT  09/19/18  Yes [provider]  Incontinence Supply Disposable (CLEANSING CLOTHS FLUSHABLE) MISC  06/04/18  Yes [provider]  insulin aspart protamine - aspart (NOVOLOG MIX 70/30 FLEXPEN) (70-30) 100 UNIT/ML FlexPen Inject 25 Units into the skin 2 (two) times  daily with a meal.  11/12/18  Yes [provider]  isosorbide mononitrate (ISMO,MONOKET) 10 MG tablet Take 10 mg by mouth 2 (two) times daily. 05/23/18  Yes [provider]  levothyroxine (SYNTHROID) 50 MCG tablet Take 50 mcg by mouth daily before breakfast.  11/07/18  Yes [provider]  lisinopril-hydrochlorothiazide (ZESTORETIC) 20-25 MG tablet Take 1 tablet by mouth daily.  11/23/18  Yes [provider]  nitroGLYCERIN (NITROSTAT) 0.4 MG SL tablet Place 0.4 mg under the tongue every 5 (five) minutes as needed for chest pain.  04/03/18  Yes [provider]  Olopatadine HCl 0.2 % SOLN  Place 1 drop into both eyes daily.  06/01/18  Yes [provider]  potassium chloride (KLOR-CON M10) 10 MEQ tablet Take 10 mEq by mouth every morning.  11/23/18  Yes [provider]  [place]  - on Muscle relief 06/04/18  Yes [provider]  [place] 1% - 60 GM 06/04/18  Yes [provider]  [place] 06/04/18  Yes [provider]  UNABLE TO FIND Diabetic Skin Relief 06/04/18  Yes [provider]  verapamil (CALAN-SR) 240 MG CR tablet Take 240 mg by mouth daily.   Yes [provider]  gabapentin (NEURONTIN) 300 MG capsule Take 300 mg by mouth 2 (two) times daily. 04/03/18   [provider]  potassium chloride (KLOR-CON) 10 MEQ tablet Take 4 tablets (40 mEq total) by mouth daily for 4 days. Patient not taking: Reported on 12/21/2019 11/10/19 11/14/19  Delia Heady, PA-C    Allergies    Tape and Aspirin  Review of Systems   Review of Systems  Constitutional: Negative for fever.  Respiratory: Negative for shortness of breath.   Cardiovascular: Negative for chest pain.  Gastrointestinal: Positive for vomiting. Negative for abdominal pain and diarrhea.  Genitourinary: Negative for dysuria.  Neurological: Positive for dizziness. Negative for syncope and headaches.  All other systems reviewed and are negative.   Physical Exam Updated Vital Signs BP (!) 160/71 (BP Location: Right Arm)   Pulse 83   Temp 98.7 F (37.1 C) (Oral)   Resp 18   Ht _0  (1.549 m)   Wt 72.6 kg   SpO2 99%   BMI 30.23 kg/m   Physical Exam Vitals and nursing note reviewed.  Constitutional:      General: She is not in acute distress.    Appearance: She is well-developed. She is not ill-appearing or diaphoretic.  HENT:     Head: Normocephalic and atraumatic.     Right Ear: External ear normal.     Left Ear: External ear normal.     Nose: Nose normal.  Eyes:     General:        Right  eye: No discharge.        Left eye: No discharge.     Extraocular Movements: Extraocular movements intact.     Pupils: Pupils are equal, round, and reactive to light.  Cardiovascular:     Rate and Rhythm: Normal rate and regular rhythm.     Heart sounds: Normal heart sounds.  Pulmonary:     Effort: Pulmonary effort is normal.     Breath sounds: Normal breath sounds.  Abdominal:     Palpations: Abdomen is soft.     Tenderness: There is no abdominal tenderness.  Skin:    General: Skin is warm and dry.  Neurological:     Mental  Status: She is alert.     Comments: CN 3-12 grossly intact. 5/5 strength in all 4 extremities. Grossly normal sensation. Normal finger to nose.   Psychiatric:        Mood and Affect: Mood is not anxious.     ED Results / Procedures / Treatments   Labs (all labs ordered are listed, but only abnormal results are displayed) Labs Reviewed  COMPREHENSIVE METABOLIC PANEL - Abnormal; Notable for the following components:      Result Value   Glucose, Bld 173 (*)    All other components within normal limits  CBC WITH DIFFERENTIAL/PLATELET - Abnormal; Notable for the following components:   RBC 3.80 (*)    Hemoglobin 11.1 (*)    HCT 34.9 (*)    All other components within normal limits  URINALYSIS, ROUTINE W REFLEX MICROSCOPIC - Abnormal; Notable for the following components:   Leukocytes,Ua LARGE (*)    Non Squamous Epithelial 0-5 (*)    All other components within normal limits  CBG MONITORING, ED - Abnormal; Notable for the following components:   Glucose-Capillary 119 (*)    All other components within normal limits  CBG MONITORING, ED - Abnormal; Notable for the following components:   Glucose-Capillary 167 (*)    All other components within normal limits  URINE CULTURE  SARS CORONAVIRUS 2 (TAT 6-24 HRS)  CULTURE, BLOOD (ROUTINE X 2)  CULTURE, BLOOD (ROUTINE X 2)    EKG EKG Interpretation  Date/Time:  Sunday December 21 2019 16:08:16  EDT Ventricular Rate:  68 PR Interval:    QRS Duration: 98 QT Interval:  429 QTC Calculation: 457 R Axis:   -16 Text Interpretation: Sinus rhythm Left ventricular hypertrophy no significant change since Feb 201 Confirmed by Sherwood Gambler [phone]) on 12/21/2019 4:24:40 PM   Radiology No results found.  Procedures Procedures (including critical care time)  Medications Ordered in ED Medications  sodium chloride 0.9 % bolus 500 mL (0 mLs Intravenous Stopped 12/21/19 1715)  cefTRIAXone (ROCEPHIN) 1 g in sodium chloride 0.9 % 100 mL IVPB (1 g Intravenous New Bag/Given 12/21/19 1943)    ED Course  I have reviewed the triage vital signs and the nursing notes.  Pertinent labs & imaging results that were available during my care of the patient were reviewed by me and considered in my medical decision making (see chart for details).    MDM Rules/Calculators/A&P                      Patient feels like her near syncopal episode is similar to prior glucose problems.  EMS reports a glucose over 300 but glucose has been in the 100s here.  I doubt it was severely affected by just 300 cc of fluid.  Given this, I discussed options with patient and daughter at the bedside.  Will admit for near syncope/syncope work-up.  Otherwise has some hypertension but is hemodynamically stable.  No urinary symptoms. Final Clinical Impression(s) / ED Diagnoses Final diagnoses:  Near syncope    Rx / DC Orders ED Discharge Orders    None       Sherwood Gambler, MD 12/22/19 [id number]

## 2019-12-22 DIAGNOSIS — R55 Syncope and collapse: Secondary | ICD-10-CM

## 2019-12-22 LAB — URINE CULTURE

## 2019-12-22 LAB — BASIC METABOLIC PANEL
Anion gap: 10 (ref 5–15)
BUN: 20 mg/dL (ref 8–23)
CO2: 27 mmol/L (ref 22–32)
Calcium: 9.4 mg/dL (ref 8.9–10.3)
Chloride: 106 mmol/L (ref 98–111)
Creatinine, Ser: 0.76 mg/dL (ref 0.44–1.00)
GFR calc Af Amer: >60 mL/min (ref >60)
GFR calc non Af Amer: >60 mL/min (ref >60)
Glucose, Bld: 126 mg/dL — ABNORMAL HIGH (ref 70–99)
Potassium: 4.1 mmol/L (ref 3.5–5.1)
Sodium: 143 mmol/L (ref 135–145)

## 2019-12-22 LAB — CBC
HCT: 35.3 % — ABNORMAL LOW (ref 36.0–46.0)
Hemoglobin: 11.1 g/dL — ABNORMAL LOW (ref 12.0–15.0)
MCH: 28.6 pg (ref 26.0–34.0)
MCHC: 31.4 g/dL (ref 30.0–36.0)
MCV: 91 fL (ref 80.0–100.0)
Platelets: 216 10*3/uL (ref 150–400)
RBC: 3.88 MIL/uL (ref 3.87–5.11)
RDW: 14.7 % (ref 11.5–15.5)
WBC: 8.7 10*3/uL (ref 4.0–10.5)
nRBC: 0 % (ref 0.0–0.2)

## 2019-12-22 LAB — GLUCOSE, CAPILLARY
Glucose-Capillary: 124 mg/dL — ABNORMAL HIGH (ref 70–99)
Glucose-Capillary: 133 mg/dL — ABNORMAL HIGH (ref 70–99)

## 2019-12-22 LAB — SARS CORONAVIRUS 2 (TAT 6-24 HRS): SARS Coronavirus 2: NEGATIVE

## 2019-12-22 LAB — HEMOGLOBIN A1C
Hgb A1c MFr Bld: 8.1 % — ABNORMAL HIGH (ref 4.8–5.6)
Mean Plasma Glucose: 185.77 mg/dL

## 2019-12-22 MED ORDER — ACETAMINOPHEN 325 MG PO TABS: 650.0000 mg | ORAL_TABLET | Freq: Four times a day (QID) | ORAL | Status: DC | PRN

## 2019-12-22 MED ORDER — ONDANSETRON HCL 4 MG/2ML IJ SOLN: 4.0000 mg | Freq: Four times a day (QID) | INTRAMUSCULAR | Status: DC | PRN

## 2019-12-22 MED ORDER — HYDROCHLOROTHIAZIDE 25 MG PO TABS
25.0000 mg | ORAL_TABLET | Freq: Every day | ORAL | Status: DC
  Administered 2019-12-22: 09:00:00 25 mg via ORAL
  Filled 2019-12-22: qty 1

## 2019-12-22 MED ORDER — LISINOPRIL 20 MG PO TABS
20.0000 mg | ORAL_TABLET | Freq: Every day | ORAL | Status: DC
  Administered 2019-12-22: 09:00:00 20 mg via ORAL
  Filled 2019-12-22: qty 1

## 2019-12-22 MED ORDER — INSULIN ASPART 100 UNIT/ML ~~LOC~~ SOLN: 0.0000 [IU] | Freq: Every day | SUBCUTANEOUS | Status: DC

## 2019-12-22 MED ORDER — SENNOSIDES-DOCUSATE SODIUM 8.6-50 MG PO TABS: 1.0000 | ORAL_TABLET | Freq: Every evening | ORAL | Status: DC | PRN

## 2019-12-22 MED ORDER — ISOSORBIDE MONONITRATE 10 MG PO TABS
10.0000 mg | ORAL_TABLET | Freq: Two times a day (BID) | ORAL | Status: DC
  Administered 2019-12-22 (×2): 10 mg via ORAL
  Filled 2019-12-22 (×3): qty 1

## 2019-12-22 MED ORDER — ENOXAPARIN SODIUM 30 MG/0.3ML ~~LOC~~ SOLN
30.0000 mg | Freq: Every day | SUBCUTANEOUS | Status: DC
  Administered 2019-12-22: 09:00:00 30 mg via SUBCUTANEOUS
  Filled 2019-12-22: qty 0.3

## 2019-12-22 MED ORDER — LISINOPRIL-HYDROCHLOROTHIAZIDE 20-25 MG PO TABS: 1.0000 | ORAL_TABLET | Freq: Every day | ORAL | Status: DC

## 2019-12-22 MED ORDER — LEVOTHYROXINE SODIUM 50 MCG PO TABS
50.0000 ug | ORAL_TABLET | Freq: Every day | ORAL | Status: DC
  Administered 2019-12-22: 06:00:00 50 ug via ORAL
  Filled 2019-12-22: qty 1

## 2019-12-22 MED ORDER — INSULIN ASPART PROT & ASPART (70-30 MIX) 100 UNIT/ML ~~LOC~~ SUSP
25.0000 [IU] | Freq: Two times a day (BID) | SUBCUTANEOUS | Status: DC
  Administered 2019-12-22: 09:00:00 25 [IU] via SUBCUTANEOUS
  Filled 2019-12-22: qty 10

## 2019-12-22 MED ORDER — ACETAMINOPHEN 650 MG RE SUPP: 650.0000 mg | Freq: Four times a day (QID) | RECTAL | Status: DC | PRN

## 2019-12-22 MED ORDER — ONDANSETRON HCL 4 MG PO TABS: 4.0000 mg | ORAL_TABLET | Freq: Four times a day (QID) | ORAL | Status: DC | PRN

## 2019-12-22 MED ORDER — VERAPAMIL HCL ER 240 MG PO TBCR
240.0000 mg | EXTENDED_RELEASE_TABLET | Freq: Every day | ORAL | Status: DC
  Administered 2019-12-22: 09:00:00 240 mg via ORAL
  Filled 2019-12-22: qty 1

## 2019-12-22 MED ORDER — INSULIN ASPART 100 UNIT/ML ~~LOC~~ SOLN
0.0000 [IU] | Freq: Three times a day (TID) | SUBCUTANEOUS | Status: DC
  Administered 2019-12-22 (×2): 2 [IU] via SUBCUTANEOUS

## 2019-12-22 MED ORDER — SODIUM CHLORIDE 0.9 % IV SOLN
1.0000 g | INTRAVENOUS | Status: DC
  Filled 2019-12-22: qty 10

## 2019-12-22 MED ORDER — ATORVASTATIN CALCIUM 40 MG PO TABS: 40.0000 mg | ORAL_TABLET | Freq: Every day | ORAL | Status: DC

## 2019-12-22 MED ORDER — GABAPENTIN 300 MG PO CAPS: 300.0000 mg | ORAL_CAPSULE | Freq: Every day | ORAL | 3 refills | Status: DC
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
## 2019-12-22 NOTE — Discharge Instructions (Signed)
Jennifer Zhang,  You were in the hospital because of the feeling of passing out and this appears to be related to high blood sugar and dehydration. Please continue your medication as prescribed except for decreasing gabapentin dose. Please stay hydrated.

## 2019-12-22 NOTE — TOC Progression Note (Signed)
Transition of Care [facility]) - Progression Note    Patient Details  Name: Jennifer Zhang MRN: [id number] Date of Birth: 01-03-1926  [facility] CM/SW Contact  Armanda Heritage, RN Phone Number: 12/22/2019, 3:41 PM  Clinical Narrative:    Patient set up with [facility] for HHPT/OT.   Expected Discharge Plan: Home w Home Health Services Barriers to Discharge: No Barriers Identified  Expected Discharge Plan and Services Expected Discharge Plan: Home w Home Health Services   Discharge Planning Services: CM Consult Post Acute Care Choice: Home Health Living arrangements for the past 2 months: Single Family Home Expected Discharge Date: 12/22/19               DME Arranged: N/A DME Agency: NA       HH Arranged: PT, OT HH Agency: [facility] Date [facility] Agency Contacted: 12/22/19 Time HH Agency Contacted: 1541 Representative spoke with at [facility] Agency: Denyse Amass   Social Determinants of Health (SDOH) Interventions    Readmission Risk Interventions No flowsheet data found.

## 2019-12-22 NOTE — Discharge Summary (Addendum)
Physician Discharge Summary  Jennifer Zhang WHQ:[id number] DOB: [birth year] DOA: 12/21/2019  PCP: Benito Mccreedy, MD  Admit date: 12/21/2019 Discharge date: 12/22/2019  Admitted From: Home Disposition: Home  Recommendations for Outpatient Follow-up:  1. Follow up with PCP in 1 week 2. Please follow up on the following pending results: None  Home Health: OT Equipment/Devices: None  Discharge Condition: Stable CODE STATUS: Full code Diet recommendation: Heart healthy/carb modified   Brief/Interim Summary:  Admission HPI written by Quintella Baton, MD   Chief Complaint: dizzy, nausea, vomiting   HPI: this is a 86y/o female who presents with c/o feeling presyncopal. She was in the house feeling sleepy, then she got dizzy. She almost fell a times and said she felt presyncopal. She was nauseous, vomiting and diaphoretic. She denies any burning urination. She denies any fevers or chills. Her granddaughter was present and called the ambulance.  The patient denies abdominal pain, diarrhea, chest pain, confusion or headache.  There was concern for hypoglycemia but FSBS was 300 in the ambulance and 110 in ER. The ER physician request observation as patient 86 y/o.   Hospital course:  Near syncope Likely related to hyperglycemia and dehydration. Orthostatic vitals obtained and are negative. No arrhythmia on telemetry. PT/OT evaluated and recommended HHOT. HHPT also ordered.  Essential hypertension Continue home isosorbide mononitrate, lisinopril-hydrochlorothiazide, verapamil.  Diabetes mellitus, type 2 Continue home regimen  Hypothyroidism Continue Synthroid  Neuropathy Decrease to gabapentin 300 mg daily  Hyperlipidemia Continue Lipitor  Discharge Diagnoses:  Principal Problem:   Near syncope Active Problems:   Type 2 diabetes mellitus without complication (HCC)   HTN (hypertension)   Hypothyroidism    Discharge Instructions  Discharge Instructions      Diet - low sodium heart healthy   Complete by: As directed    Increase activity slowly   Complete by: As directed      Allergies as of 12/22/2019      Reactions   Tape Other (See Comments)   SKIN WILL TEAR EASILY!!   Aspirin Palpitations, Other (See Comments)   Heart flutters      Medication List    STOP taking these medications   potassium chloride 10 MEQ tablet Commonly known as: KLOR-CON     TAKE these medications   Accu-Chek FastClix Lancets Misc   Accu-Chek Guide test strip Generic drug: glucose blood 1 each by Other route. 2-3   Accu-Chek Guide w/Device Kit   acetaminophen 500 MG tablet Commonly known as: TYLENOL Take 500-1,000 mg by mouth every 6 (six) hours as needed for mild pain or headache.   Alcohol Prep Pads   atorvastatin 40 MG tablet Commonly known as: LIPITOR Take 40 mg by mouth at bedtime.   B-D UF III MINI PEN NEEDLES 31G X 5 MM Misc Generic drug: Insulin Pen Needle See admin instructions.   BD Insulin Syringe U/F 31G X 5/16" 0.5 ML Misc Generic drug: Insulin Syringe-Needle U-100   Cleansing Cloths Flushable Misc   EQL DRY MOUTH ORAL RINSE MT Dry Mouth Oral Rinse   gabapentin 300 MG capsule Commonly known as: NEURONTIN Take 1 capsule (300 mg total) by mouth daily. What changed: when to take this   isosorbide mononitrate 10 MG tablet Commonly known as: ISMO Take 10 mg by mouth 2 (two) times daily.   Klor-Con M10 10 MEQ tablet Generic drug: potassium chloride Take 10 mEq by mouth every morning.   levothyroxine 50 MCG tablet Commonly known as: SYNTHROID Take 50 mcg by  mouth daily before breakfast.   lisinopril-hydrochlorothiazide 20-25 MG tablet Commonly known as: ZESTORETIC Take 1 tablet by mouth daily.   nitroGLYCERIN 0.4 MG SL tablet Commonly known as: NITROSTAT Place 0.4 mg under the tongue every 5 (five) minutes as needed for chest pain.   NovoLOG Mix 70/30 FlexPen (70-30) 100 UNIT/ML FlexPen Generic drug: insulin  aspart protamine - aspart Inject 25 Units into the skin 2 (two) times daily with a meal.   Olopatadine HCl 0.2 % Soln Place 1 drop into both eyes daily.   UNABLE TO FIND Roll  - on Muscle relief   UNABLE TO FIND HYDROCORTISONE 1% - 60 GM   UNABLE TO FIND Disposable Underwear Ultra-L   UNABLE TO FIND Diabetic Skin Relief   verapamil 240 MG CR tablet Commonly known as: CALAN-SR Take 240 mg by mouth daily.       Allergies  Allergen Reactions  . Tape Other (See Comments)    SKIN WILL TEAR EASILY!!  . Aspirin Palpitations and Other (See Comments)    Heart flutters    Consultations:  None   Procedures/Studies:  No results found.   Subjective: No issues overnight.  Discharge Exam: Vitals:   12/22/19 0127 12/22/19 0506  BP: 136/82 (!) 145/74  Pulse: 75 73  Resp: 16 16  Temp: 98 F (36.7 C) 98 F (36.7 C)  SpO2: 97% 98%   Vitals:   12/21/19 1951 12/21/19 2019 12/22/19 0127 12/22/19 0506  BP: 136/68 (!) 160/71 136/82 (!) 145/74  Pulse: 71 83 75 73  Resp: _0 Temp: 98.4 F (36.9 C) 98.7 F (37.1 C) 98 F (36.7 C) 98 F (36.7 C)  TempSrc: Oral Oral Oral Oral  SpO2: 96% 99% 97% 98%  Weight:      Height:        General: Pt is alert, awake, not in acute distress Cardiovascular: RRR, W2/H8 +, systolic murmur, no rubs, no gallops Respiratory: CTA bilaterally, no wheezing, no rhonchi Abdominal: Soft, NT, ND, bowel sounds + Extremities: no edema, no cyanosis    The results of significant diagnostics from this hospitalization (including imaging, microbiology, ancillary and laboratory) are listed below for reference.     Microbiology: Recent Results (from the past 240 hour(s))  SARS CORONAVIRUS 2 (TAT 6-24 HRS) Nasopharyngeal Nasopharyngeal Swab     Status: None   Collection Time: 12/21/19  6:07 PM   Specimen: Nasopharyngeal Swab  Result Value Ref Range Status   SARS Coronavirus 2 NEGATIVE NEGATIVE Final    Comment: (NOTE) SARS-CoV-2  target nucleic acids are NOT DETECTED. The SARS-CoV-2 RNA is generally detectable in upper and lower respiratory specimens during the acute phase of infection. Negative results do not preclude SARS-CoV-2 infection, do not rule out co-infections with other pathogens, and should not be used as the sole basis for treatment or other patient management decisions. Negative results must be combined with clinical observations, patient history, and epidemiological information. The expected result is Negative. Fact Sheet for Patients: SugarRoll.be Fact Sheet for Healthcare Providers: https://www.woods-mathews.com/ This test is not yet approved or cleared by the [place] FDA and  has been authorized for detection and/or diagnosis of SARS-CoV-2 by FDA under an Emergency Use Authorization (EUA). This EUA will remain  in effect (meaning this test can be used) for the duration of the COVID-19 declaration under Section 56 4(b)(1) of the Act, 21 U.S.C. section 360bbb-3(b)(1), unless the authorization is terminated or revoked sooner. Performed at [facility], [place]  [address], [place], [place] [address]   Culture, blood (Routine X 2) w Reflex to ID Panel     Status: None (Preliminary result)   Collection Time: 12/21/19  6:27 PM   Specimen: BLOOD  Result Value Ref Range Status   Specimen Description   Final    BLOOD LEFT ANTECUBITAL Performed at [facility] [address], [place], [place] [address]    Special Requests   Final    BOTTLES DRAWN AEROBIC AND ANAEROBIC Blood Culture adequate volume Performed at [facility] [address] [place], [place] [address]    Culture   Final    NO GROWTH < 12 HOURS Performed at [facility] [address] [place], [place] [address]    Report Status PENDING  Incomplete  Culture, blood (Routine X 2) w Reflex to ID Panel     Status: None (Preliminary result)    Collection Time: 12/21/19  6:32 PM   Specimen: BLOOD  Result Value Ref Range Status   Specimen Description   Final    BLOOD RIGHT ANTECUBITAL Performed at [facility] [address] [place], [place] [address]    Special Requests   Final    BOTTLES DRAWN AEROBIC AND ANAEROBIC Blood Culture results may not be optimal due to an inadequate volume of blood received in culture bottles Performed at [facility] [address] [place], [place] [address]    Culture   Final    NO GROWTH < 12 HOURS Performed at [facility] [address], [place], [place] [address]    Report Status PENDING  Incomplete     Labs: BNP (last 3 results) No results for input(s): BNP in the last 8760 hours. Basic Metabolic Panel: Recent Labs  Lab 12/21/19 1623 12/22/19 0453  NA 141 143  K 3.6 4.1  CL 103 106  CO2 28 27  GLUCOSE 173* 126*  BUN 23 20  CREATININE 0.82 0.76  CALCIUM 9.2 9.4   Liver Function Tests: Recent Labs  Lab 12/21/19 1623  AST 17  ALT 16  ALKPHOS 54  BILITOT 0.3  PROT 7.1  ALBUMIN 3.8   No results for input(s): LIPASE, AMYLASE in the last 168 hours. No results for input(s): AMMONIA in the last 168 hours. CBC: Recent Labs  Lab 12/21/19 1623 12/22/19 0453  WBC 9.7 8.7  NEUTROABS 7.2  --   HGB 11.1* 11.1*  HCT 34.9* 35.3*  MCV 91.8 91.0  PLT 215 216   Cardiac Enzymes: No results for input(s): CKTOTAL, CKMB, CKMBINDEX, TROPONINI in the last 168 hours. BNP: Invalid input(s): POCBNP CBG: Recent Labs  Lab 12/21/19 1623 12/21/19 1737 12/22/19 0753 12/22/19 1243  GLUCAP 119* 167* 124* 133*   D-Dimer No results for input(s): DDIMER in the last 72 hours. Hgb A1c Recent Labs    12/22/19 0453  HGBA1C 8.1*   Lipid Profile No results for input(s): CHOL, HDL, LDLCALC, TRIG, CHOLHDL, LDLDIRECT in the last 72 hours. Thyroid function studies No results for input(s): TSH, T4TOTAL, T3FREE, THYROIDAB in the last 72  hours.  Invalid input(s): FREET3 Anemia work up No results for input(s): VITAMINB12, FOLATE, FERRITIN, TIBC, IRON, RETICCTPCT in the last 72 hours. Urinalysis    Component Value Date/Time   COLORURINE YELLOW 12/21/2019 1623   APPEARANCEUR CLEAR 12/21/2019 1623   LABSPEC 1.018 12/21/2019 1623   PHURINE 6.0 12/21/2019 1623   GLUCOSEU NEGATIVE 12/21/2019 1623   HGBUR NEGATIVE 12/21/2019 1623  BILIRUBINUR NEGATIVE 12/21/2019 1623   KETONESUR NEGATIVE 12/21/2019 1623   PROTEINUR NEGATIVE 12/21/2019 1623   UROBILINOGEN 0.2 09/10/2014 1052   NITRITE NEGATIVE 12/21/2019 1623   LEUKOCYTESUR LARGE (A) 12/21/2019 1623   Sepsis Labs Invalid input(s): PROCALCITONIN,  WBC,  LACTICIDVEN Microbiology Recent Results (from the past 240 hour(s))  SARS CORONAVIRUS 2 (TAT 6-24 HRS) Nasopharyngeal Nasopharyngeal Swab     Status: None   Collection Time: 12/21/19  6:07 PM   Specimen: Nasopharyngeal Swab  Result Value Ref Range Status   SARS Coronavirus 2 NEGATIVE NEGATIVE Final    Comment: (NOTE) SARS-CoV-2 target nucleic acids are NOT DETECTED. The SARS-CoV-2 RNA is generally detectable in upper and lower respiratory specimens during the acute phase of infection. Negative results do not preclude SARS-CoV-2 infection, do not rule out co-infections with other pathogens, and should not be used as the sole basis for treatment or other patient management decisions. Negative results must be combined with clinical observations, patient history, and epidemiological information. The expected result is Negative. Fact Sheet for Patients: SugarRoll.be Fact Sheet for Healthcare Providers: https://www.woods-mathews.com/ This test is not yet approved or cleared by the [place] FDA and  has been authorized for detection and/or diagnosis of SARS-CoV-2 by FDA under an Emergency Use Authorization (EUA). This EUA will remain  in effect (meaning this test can be used)  for the duration of the COVID-19 declaration under Section 56 4(b)(1) of the Act, 21 U.S.C. section 360bbb-3(b)(1), unless the authorization is terminated or revoked sooner. Performed at [facility], [place] [address] [place], [place] [address]   Culture, blood (Routine X 2) w Reflex to ID Panel     Status: None (Preliminary result)   Collection Time: 12/21/19  6:27 PM   Specimen: BLOOD  Result Value Ref Range Status   Specimen Description   Final    BLOOD LEFT ANTECUBITAL Performed at [facility] [address] [place], [place] [address]    Special Requests   Final    BOTTLES DRAWN AEROBIC AND ANAEROBIC Blood Culture adequate volume Performed at [facility] [address] [place], [place] [address]    Culture   Final    NO GROWTH < 12 HOURS Performed at [facility] [address], [place], [place] [address]    Report Status PENDING  Incomplete  Culture, blood (Routine X 2) w Reflex to ID Panel     Status: None (Preliminary result)   Collection Time: 12/21/19  6:32 PM   Specimen: BLOOD  Result Value Ref Range Status   Specimen Description   Final    BLOOD RIGHT ANTECUBITAL Performed at [facility] [address] [place], [place] [address]    Special Requests   Final    BOTTLES DRAWN AEROBIC AND ANAEROBIC Blood Culture results may not be optimal due to an inadequate volume of blood received in culture bottles Performed at [facility] [address] [place], [place] [address]    Culture   Final    NO GROWTH < 12 HOURS Performed at [facility] [address], [place],  [address]    Report Status PENDING  Incomplete     SIGNED:   Cordelia Poche, MD [facility] 12/22/2019, 2:56 PM
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
## 2019-12-24 DIAGNOSIS — Z87891 Personal history of nicotine dependence: Secondary | ICD-10-CM | POA: Diagnosis not present

## 2019-12-24 DIAGNOSIS — M1991 Primary osteoarthritis, unspecified site: Secondary | ICD-10-CM | POA: Diagnosis not present

## 2019-12-24 DIAGNOSIS — E1140 Type 2 diabetes mellitus with diabetic neuropathy, unspecified: Secondary | ICD-10-CM | POA: Diagnosis not present

## 2019-12-24 DIAGNOSIS — I10 Essential (primary) hypertension: Secondary | ICD-10-CM | POA: Diagnosis not present

## 2019-12-24 DIAGNOSIS — E785 Hyperlipidemia, unspecified: Secondary | ICD-10-CM | POA: Diagnosis not present

## 2019-12-24 DIAGNOSIS — Z794 Long term (current) use of insulin: Secondary | ICD-10-CM | POA: Diagnosis not present

## 2019-12-24 DIAGNOSIS — E039 Hypothyroidism, unspecified: Secondary | ICD-10-CM | POA: Diagnosis not present

## 2019-12-26 LAB — CULTURE, BLOOD (ROUTINE X 2)
Culture: NO GROWTH
Culture: NO GROWTH
Special Requests: ADEQUATE

## 2019-12-30 DIAGNOSIS — E1140 Type 2 diabetes mellitus with diabetic neuropathy, unspecified: Secondary | ICD-10-CM | POA: Diagnosis not present

## 2019-12-30 DIAGNOSIS — E039 Hypothyroidism, unspecified: Secondary | ICD-10-CM | POA: Diagnosis not present

## 2019-12-30 DIAGNOSIS — I10 Essential (primary) hypertension: Secondary | ICD-10-CM | POA: Diagnosis not present

## 2019-12-30 DIAGNOSIS — M1991 Primary osteoarthritis, unspecified site: Secondary | ICD-10-CM | POA: Diagnosis not present

## 2019-12-30 DIAGNOSIS — Z794 Long term (current) use of insulin: Secondary | ICD-10-CM | POA: Diagnosis not present

## 2019-12-30 DIAGNOSIS — E785 Hyperlipidemia, unspecified: Secondary | ICD-10-CM | POA: Diagnosis not present

## 2019-12-30 DIAGNOSIS — Z87891 Personal history of nicotine dependence: Secondary | ICD-10-CM | POA: Diagnosis not present

## 2020-01-05 DIAGNOSIS — I10 Essential (primary) hypertension: Secondary | ICD-10-CM | POA: Diagnosis not present

## 2020-01-05 DIAGNOSIS — E1140 Type 2 diabetes mellitus with diabetic neuropathy, unspecified: Secondary | ICD-10-CM | POA: Diagnosis not present

## 2020-01-05 DIAGNOSIS — E785 Hyperlipidemia, unspecified: Secondary | ICD-10-CM | POA: Diagnosis not present

## 2020-01-05 DIAGNOSIS — Z87891 Personal history of nicotine dependence: Secondary | ICD-10-CM | POA: Diagnosis not present

## 2020-01-05 DIAGNOSIS — M1991 Primary osteoarthritis, unspecified site: Secondary | ICD-10-CM | POA: Diagnosis not present

## 2020-01-05 DIAGNOSIS — E039 Hypothyroidism, unspecified: Secondary | ICD-10-CM | POA: Diagnosis not present

## 2020-01-05 DIAGNOSIS — Z794 Long term (current) use of insulin: Secondary | ICD-10-CM | POA: Diagnosis not present

## 2020-01-07 DIAGNOSIS — E039 Hypothyroidism, unspecified: Secondary | ICD-10-CM | POA: Diagnosis not present

## 2020-01-07 DIAGNOSIS — Z87891 Personal history of nicotine dependence: Secondary | ICD-10-CM | POA: Diagnosis not present

## 2020-01-07 DIAGNOSIS — Z794 Long term (current) use of insulin: Secondary | ICD-10-CM | POA: Diagnosis not present

## 2020-01-07 DIAGNOSIS — I10 Essential (primary) hypertension: Secondary | ICD-10-CM | POA: Diagnosis not present

## 2020-01-07 DIAGNOSIS — E1140 Type 2 diabetes mellitus with diabetic neuropathy, unspecified: Secondary | ICD-10-CM | POA: Diagnosis not present

## 2020-01-07 DIAGNOSIS — E785 Hyperlipidemia, unspecified: Secondary | ICD-10-CM | POA: Diagnosis not present

## 2020-01-07 DIAGNOSIS — M1991 Primary osteoarthritis, unspecified site: Secondary | ICD-10-CM | POA: Diagnosis not present

## 2020-01-09 DIAGNOSIS — E1140 Type 2 diabetes mellitus with diabetic neuropathy, unspecified: Secondary | ICD-10-CM | POA: Diagnosis not present

## 2020-01-09 DIAGNOSIS — I10 Essential (primary) hypertension: Secondary | ICD-10-CM | POA: Diagnosis not present

## 2020-01-09 DIAGNOSIS — Z87891 Personal history of nicotine dependence: Secondary | ICD-10-CM | POA: Diagnosis not present

## 2020-01-09 DIAGNOSIS — E039 Hypothyroidism, unspecified: Secondary | ICD-10-CM | POA: Diagnosis not present

## 2020-01-09 DIAGNOSIS — E785 Hyperlipidemia, unspecified: Secondary | ICD-10-CM | POA: Diagnosis not present

## 2020-01-09 DIAGNOSIS — Z794 Long term (current) use of insulin: Secondary | ICD-10-CM | POA: Diagnosis not present

## 2020-01-09 DIAGNOSIS — M1991 Primary osteoarthritis, unspecified site: Secondary | ICD-10-CM | POA: Diagnosis not present

## 2020-01-12 DIAGNOSIS — I10 Essential (primary) hypertension: Secondary | ICD-10-CM | POA: Diagnosis not present

## 2020-01-12 DIAGNOSIS — E1140 Type 2 diabetes mellitus with diabetic neuropathy, unspecified: Secondary | ICD-10-CM | POA: Diagnosis not present

## 2020-01-12 DIAGNOSIS — E785 Hyperlipidemia, unspecified: Secondary | ICD-10-CM | POA: Diagnosis not present

## 2020-01-12 DIAGNOSIS — M1991 Primary osteoarthritis, unspecified site: Secondary | ICD-10-CM | POA: Diagnosis not present

## 2020-01-12 DIAGNOSIS — E039 Hypothyroidism, unspecified: Secondary | ICD-10-CM | POA: Diagnosis not present

## 2020-01-12 DIAGNOSIS — Z87891 Personal history of nicotine dependence: Secondary | ICD-10-CM | POA: Diagnosis not present

## 2020-01-12 DIAGNOSIS — Z794 Long term (current) use of insulin: Secondary | ICD-10-CM | POA: Diagnosis not present

## 2020-01-14 DIAGNOSIS — M1991 Primary osteoarthritis, unspecified site: Secondary | ICD-10-CM | POA: Diagnosis not present

## 2020-01-14 DIAGNOSIS — E1140 Type 2 diabetes mellitus with diabetic neuropathy, unspecified: Secondary | ICD-10-CM | POA: Diagnosis not present

## 2020-01-14 DIAGNOSIS — E039 Hypothyroidism, unspecified: Secondary | ICD-10-CM | POA: Diagnosis not present

## 2020-01-14 DIAGNOSIS — Z794 Long term (current) use of insulin: Secondary | ICD-10-CM | POA: Diagnosis not present

## 2020-01-14 DIAGNOSIS — Z87891 Personal history of nicotine dependence: Secondary | ICD-10-CM | POA: Diagnosis not present

## 2020-01-14 DIAGNOSIS — E785 Hyperlipidemia, unspecified: Secondary | ICD-10-CM | POA: Diagnosis not present

## 2020-01-14 DIAGNOSIS — I10 Essential (primary) hypertension: Secondary | ICD-10-CM | POA: Diagnosis not present

## 2020-01-15 DIAGNOSIS — I10 Essential (primary) hypertension: Secondary | ICD-10-CM | POA: Diagnosis not present

## 2020-01-15 DIAGNOSIS — M1991 Primary osteoarthritis, unspecified site: Secondary | ICD-10-CM | POA: Diagnosis not present

## 2020-01-15 DIAGNOSIS — E039 Hypothyroidism, unspecified: Secondary | ICD-10-CM | POA: Diagnosis not present

## 2020-01-15 DIAGNOSIS — Z87891 Personal history of nicotine dependence: Secondary | ICD-10-CM | POA: Diagnosis not present

## 2020-01-15 DIAGNOSIS — E785 Hyperlipidemia, unspecified: Secondary | ICD-10-CM | POA: Diagnosis not present

## 2020-01-15 DIAGNOSIS — Z794 Long term (current) use of insulin: Secondary | ICD-10-CM | POA: Diagnosis not present

## 2020-01-15 DIAGNOSIS — E1140 Type 2 diabetes mellitus with diabetic neuropathy, unspecified: Secondary | ICD-10-CM | POA: Diagnosis not present

## 2020-01-19 DIAGNOSIS — Z794 Long term (current) use of insulin: Secondary | ICD-10-CM | POA: Diagnosis not present

## 2020-01-19 DIAGNOSIS — E039 Hypothyroidism, unspecified: Secondary | ICD-10-CM | POA: Diagnosis not present

## 2020-01-19 DIAGNOSIS — E1140 Type 2 diabetes mellitus with diabetic neuropathy, unspecified: Secondary | ICD-10-CM | POA: Diagnosis not present

## 2020-01-19 DIAGNOSIS — Z87891 Personal history of nicotine dependence: Secondary | ICD-10-CM | POA: Diagnosis not present

## 2020-01-19 DIAGNOSIS — I10 Essential (primary) hypertension: Secondary | ICD-10-CM | POA: Diagnosis not present

## 2020-01-19 DIAGNOSIS — M1991 Primary osteoarthritis, unspecified site: Secondary | ICD-10-CM | POA: Diagnosis not present

## 2020-01-19 DIAGNOSIS — E785 Hyperlipidemia, unspecified: Secondary | ICD-10-CM | POA: Diagnosis not present

## 2020-01-21 DIAGNOSIS — E785 Hyperlipidemia, unspecified: Secondary | ICD-10-CM | POA: Diagnosis not present

## 2020-01-21 DIAGNOSIS — E1140 Type 2 diabetes mellitus with diabetic neuropathy, unspecified: Secondary | ICD-10-CM | POA: Diagnosis not present

## 2020-01-21 DIAGNOSIS — I10 Essential (primary) hypertension: Secondary | ICD-10-CM | POA: Diagnosis not present

## 2020-01-21 DIAGNOSIS — M1991 Primary osteoarthritis, unspecified site: Secondary | ICD-10-CM | POA: Diagnosis not present

## 2020-01-21 DIAGNOSIS — Z794 Long term (current) use of insulin: Secondary | ICD-10-CM | POA: Diagnosis not present

## 2020-01-21 DIAGNOSIS — Z87891 Personal history of nicotine dependence: Secondary | ICD-10-CM | POA: Diagnosis not present

## 2020-01-21 DIAGNOSIS — E039 Hypothyroidism, unspecified: Secondary | ICD-10-CM | POA: Diagnosis not present

## 2020-01-23 ENCOUNTER — Ambulatory Visit: Payer: Medicare HMO | Admitting: Podiatry

## 2020-01-23 DIAGNOSIS — Z87891 Personal history of nicotine dependence: Secondary | ICD-10-CM | POA: Diagnosis not present

## 2020-01-23 DIAGNOSIS — Z794 Long term (current) use of insulin: Secondary | ICD-10-CM | POA: Diagnosis not present

## 2020-01-23 DIAGNOSIS — M1991 Primary osteoarthritis, unspecified site: Secondary | ICD-10-CM | POA: Diagnosis not present

## 2020-01-23 DIAGNOSIS — I10 Essential (primary) hypertension: Secondary | ICD-10-CM | POA: Diagnosis not present

## 2020-01-23 DIAGNOSIS — E1140 Type 2 diabetes mellitus with diabetic neuropathy, unspecified: Secondary | ICD-10-CM | POA: Diagnosis not present

## 2020-01-23 DIAGNOSIS — E785 Hyperlipidemia, unspecified: Secondary | ICD-10-CM | POA: Diagnosis not present

## 2020-01-23 DIAGNOSIS — E039 Hypothyroidism, unspecified: Secondary | ICD-10-CM | POA: Diagnosis not present

## 2020-01-26 DIAGNOSIS — E039 Hypothyroidism, unspecified: Secondary | ICD-10-CM | POA: Diagnosis not present

## 2020-01-26 DIAGNOSIS — Z87891 Personal history of nicotine dependence: Secondary | ICD-10-CM | POA: Diagnosis not present

## 2020-01-26 DIAGNOSIS — I10 Essential (primary) hypertension: Secondary | ICD-10-CM | POA: Diagnosis not present

## 2020-01-26 DIAGNOSIS — E785 Hyperlipidemia, unspecified: Secondary | ICD-10-CM | POA: Diagnosis not present

## 2020-01-26 DIAGNOSIS — E1140 Type 2 diabetes mellitus with diabetic neuropathy, unspecified: Secondary | ICD-10-CM | POA: Diagnosis not present

## 2020-01-26 DIAGNOSIS — Z794 Long term (current) use of insulin: Secondary | ICD-10-CM | POA: Diagnosis not present

## 2020-01-26 DIAGNOSIS — M1991 Primary osteoarthritis, unspecified site: Secondary | ICD-10-CM | POA: Diagnosis not present

## 2020-01-30 DIAGNOSIS — Z794 Long term (current) use of insulin: Secondary | ICD-10-CM | POA: Diagnosis not present

## 2020-01-30 DIAGNOSIS — H5203 Hypermetropia, bilateral: Secondary | ICD-10-CM | POA: Diagnosis not present

## 2020-01-30 DIAGNOSIS — E119 Type 2 diabetes mellitus without complications: Secondary | ICD-10-CM | POA: Diagnosis not present

## 2020-01-30 DIAGNOSIS — I10 Essential (primary) hypertension: Secondary | ICD-10-CM | POA: Diagnosis not present

## 2020-01-30 DIAGNOSIS — H33322 Round hole, left eye: Secondary | ICD-10-CM | POA: Diagnosis not present

## 2020-01-30 DIAGNOSIS — H524 Presbyopia: Secondary | ICD-10-CM | POA: Diagnosis not present

## 2020-01-30 DIAGNOSIS — Z961 Presence of intraocular lens: Secondary | ICD-10-CM | POA: Diagnosis not present

## 2020-01-30 DIAGNOSIS — H52223 Regular astigmatism, bilateral: Secondary | ICD-10-CM | POA: Diagnosis not present

## 2020-01-30 DIAGNOSIS — H35342 Macular cyst, hole, or pseudohole, left eye: Secondary | ICD-10-CM | POA: Diagnosis not present

## 2020-02-02 DIAGNOSIS — E1140 Type 2 diabetes mellitus with diabetic neuropathy, unspecified: Secondary | ICD-10-CM | POA: Diagnosis not present

## 2020-02-02 DIAGNOSIS — Z87891 Personal history of nicotine dependence: Secondary | ICD-10-CM | POA: Diagnosis not present

## 2020-02-02 DIAGNOSIS — Z794 Long term (current) use of insulin: Secondary | ICD-10-CM | POA: Diagnosis not present

## 2020-02-02 DIAGNOSIS — E785 Hyperlipidemia, unspecified: Secondary | ICD-10-CM | POA: Diagnosis not present

## 2020-02-02 DIAGNOSIS — E039 Hypothyroidism, unspecified: Secondary | ICD-10-CM | POA: Diagnosis not present

## 2020-02-02 DIAGNOSIS — I10 Essential (primary) hypertension: Secondary | ICD-10-CM | POA: Diagnosis not present

## 2020-02-02 DIAGNOSIS — M1991 Primary osteoarthritis, unspecified site: Secondary | ICD-10-CM | POA: Diagnosis not present

## 2020-02-13 DIAGNOSIS — Z87891 Personal history of nicotine dependence: Secondary | ICD-10-CM | POA: Diagnosis not present

## 2020-02-13 DIAGNOSIS — E785 Hyperlipidemia, unspecified: Secondary | ICD-10-CM | POA: Diagnosis not present

## 2020-02-13 DIAGNOSIS — E039 Hypothyroidism, unspecified: Secondary | ICD-10-CM | POA: Diagnosis not present

## 2020-02-13 DIAGNOSIS — E1140 Type 2 diabetes mellitus with diabetic neuropathy, unspecified: Secondary | ICD-10-CM | POA: Diagnosis not present

## 2020-02-13 DIAGNOSIS — I10 Essential (primary) hypertension: Secondary | ICD-10-CM | POA: Diagnosis not present

## 2020-02-13 DIAGNOSIS — M1991 Primary osteoarthritis, unspecified site: Secondary | ICD-10-CM | POA: Diagnosis not present

## 2020-02-13 DIAGNOSIS — Z794 Long term (current) use of insulin: Secondary | ICD-10-CM | POA: Diagnosis not present

## 2020-03-10 ENCOUNTER — Other Ambulatory Visit: Payer: Self-pay | Admitting: Internal Medicine

## 2020-03-10 DIAGNOSIS — Z1231 Encounter for screening mammogram for malignant neoplasm of breast: Secondary | ICD-10-CM

## 2020-03-23 DIAGNOSIS — E7800 Pure hypercholesterolemia, unspecified: Secondary | ICD-10-CM | POA: Diagnosis not present

## 2020-03-23 DIAGNOSIS — E1165 Type 2 diabetes mellitus with hyperglycemia: Secondary | ICD-10-CM | POA: Diagnosis not present

## 2020-03-23 DIAGNOSIS — E039 Hypothyroidism, unspecified: Secondary | ICD-10-CM | POA: Diagnosis not present

## 2020-03-23 DIAGNOSIS — I2510 Atherosclerotic heart disease of native coronary artery without angina pectoris: Secondary | ICD-10-CM | POA: Diagnosis not present

## 2020-03-23 DIAGNOSIS — M2550 Pain in unspecified joint: Secondary | ICD-10-CM | POA: Diagnosis not present

## 2020-03-23 DIAGNOSIS — I10 Essential (primary) hypertension: Secondary | ICD-10-CM | POA: Diagnosis not present

## 2020-03-30 ENCOUNTER — Other Ambulatory Visit: Payer: Self-pay

## 2020-03-30 ENCOUNTER — Ambulatory Visit (INDEPENDENT_AMBULATORY_CARE_PROVIDER_SITE_OTHER): Payer: Medicare HMO | Admitting: Podiatry

## 2020-03-30 ENCOUNTER — Encounter: Payer: Self-pay | Admitting: Podiatry

## 2020-03-30 DIAGNOSIS — M79676 Pain in unspecified toe(s): Secondary | ICD-10-CM | POA: Diagnosis not present

## 2020-03-30 DIAGNOSIS — E119 Type 2 diabetes mellitus without complications: Secondary | ICD-10-CM | POA: Diagnosis not present

## 2020-03-30 DIAGNOSIS — B351 Tinea unguium: Secondary | ICD-10-CM

## 2020-03-30 DIAGNOSIS — M2142 Flat foot [pes planus] (acquired), left foot: Secondary | ICD-10-CM

## 2020-03-30 DIAGNOSIS — M2140 Flat foot [pes planus] (acquired), unspecified foot: Secondary | ICD-10-CM | POA: Insufficient documentation

## 2020-03-30 DIAGNOSIS — M2010 Hallux valgus (acquired), unspecified foot: Secondary | ICD-10-CM

## 2020-03-30 DIAGNOSIS — M2141 Flat foot [pes planus] (acquired), right foot: Secondary | ICD-10-CM

## 2020-03-30 NOTE — Progress Notes (Addendum)
This patient returns to my office for at risk foot care.  This patient requires this care by a professional since this patient will be at risk due to having diabetes.   This patient is unable to cut nails herself since the patient cannot reach her nails.These nails are painful walking and wearing shoes.  This patient presents for at risk foot care today. Patient presents to the office with her daughter.  Her daughter requests diabetic shoes for her mother.  General Appearance  Alert, conversant and in no acute stress.  Vascular  Dorsalis pedis and posterior tibial  pulses are palpable  bilaterally.  Capillary return is within normal limits  bilaterally. Temperature is within normal limits  bilaterally.  Neurologic  Senn-Weinstein monofilament wire test within normal limits  bilaterally. Muscle power within normal limits bilaterally.  Nails Thick disfigured discolored nails with subungual debris  from hallux to fifth toes bilaterally. No evidence of bacterial infection or drainage bilaterally.  Orthopedic  No limitations of motion  feet .  No crepitus or effusions noted.  No bony pathology or digital deformities noted.  HAV  B/L.  Pes planus.  Skin  normotropic skin with no porokeratosis noted bilaterally.  No signs of infections or ulcers noted.     Onychomycosis  Pain in right toes  Pain in left toes  Consent was obtained for treatment procedures.   Mechanical debridement of nails 1-5  bilaterally performed with a nail nipper.  Patient requests no dremel tool usage.  Patient to see Raiford Noble about diabetic shoes.   Return office visit      3 months               Told patient to return for periodic foot care and evaluation due to potential at risk complications.   Helane Gunther DPM

## 2020-05-06 DIAGNOSIS — I2510 Atherosclerotic heart disease of native coronary artery without angina pectoris: Secondary | ICD-10-CM | POA: Diagnosis not present

## 2020-05-06 DIAGNOSIS — M2550 Pain in unspecified joint: Secondary | ICD-10-CM | POA: Diagnosis not present

## 2020-05-06 DIAGNOSIS — E7800 Pure hypercholesterolemia, unspecified: Secondary | ICD-10-CM | POA: Diagnosis not present

## 2020-05-06 DIAGNOSIS — E039 Hypothyroidism, unspecified: Secondary | ICD-10-CM | POA: Diagnosis not present

## 2020-05-06 DIAGNOSIS — I10 Essential (primary) hypertension: Secondary | ICD-10-CM | POA: Diagnosis not present

## 2020-05-06 DIAGNOSIS — E1165 Type 2 diabetes mellitus with hyperglycemia: Secondary | ICD-10-CM | POA: Diagnosis not present

## 2020-06-15 DIAGNOSIS — E1165 Type 2 diabetes mellitus with hyperglycemia: Secondary | ICD-10-CM | POA: Diagnosis not present

## 2020-06-15 DIAGNOSIS — M2550 Pain in unspecified joint: Secondary | ICD-10-CM | POA: Diagnosis not present

## 2020-06-15 DIAGNOSIS — I2510 Atherosclerotic heart disease of native coronary artery without angina pectoris: Secondary | ICD-10-CM | POA: Diagnosis not present

## 2020-06-15 DIAGNOSIS — I10 Essential (primary) hypertension: Secondary | ICD-10-CM | POA: Diagnosis not present

## 2020-06-30 ENCOUNTER — Telehealth: Payer: Self-pay | Admitting: Podiatry

## 2020-06-30 ENCOUNTER — Ambulatory Visit
Admission: RE | Admit: 2020-06-30 | Discharge: 2020-06-30 | Disposition: A | Discharge status: Home / Self Care | Payer: Medicare HMO | Source: Ambulatory Visit | Attending: Internal Medicine | Admitting: Internal Medicine

## 2020-06-30 ENCOUNTER — Other Ambulatory Visit: Payer: Self-pay

## 2020-06-30 DIAGNOSIS — Z1231 Encounter for screening mammogram for malignant neoplasm of breast: Secondary | ICD-10-CM | POA: Diagnosis not present

## 2020-06-30 NOTE — Telephone Encounter (Signed)
pts daughter Hilda Blades) left 2 messages 1 yesterday @435pm  and 1 today @ 1155am checking on her moms diabetic shoes.She also came in the office today to check when I was at lunch.  I returned call and apologized that I had not returned call but I was out of my office in a different position yesterday. Pts shoes came in yesterday and pt was already scheduled to see Dr 10.5 @ 415 so I just added for pt to pick up shoes at that time as well.

## 2020-07-06 ENCOUNTER — Encounter: Payer: Self-pay | Admitting: Podiatry

## 2020-07-06 ENCOUNTER — Ambulatory Visit (INDEPENDENT_AMBULATORY_CARE_PROVIDER_SITE_OTHER): Payer: Medicare HMO | Admitting: Podiatry

## 2020-07-06 ENCOUNTER — Other Ambulatory Visit: Payer: Self-pay

## 2020-07-06 DIAGNOSIS — M2142 Flat foot [pes planus] (acquired), left foot: Secondary | ICD-10-CM

## 2020-07-06 DIAGNOSIS — E119 Type 2 diabetes mellitus without complications: Secondary | ICD-10-CM

## 2020-07-06 DIAGNOSIS — M2010 Hallux valgus (acquired), unspecified foot: Secondary | ICD-10-CM

## 2020-07-06 DIAGNOSIS — M2141 Flat foot [pes planus] (acquired), right foot: Secondary | ICD-10-CM | POA: Diagnosis not present

## 2020-07-06 DIAGNOSIS — B351 Tinea unguium: Secondary | ICD-10-CM

## 2020-07-06 DIAGNOSIS — M79676 Pain in unspecified toe(s): Secondary | ICD-10-CM | POA: Diagnosis not present

## 2020-07-06 NOTE — Progress Notes (Signed)
This patient returns to my office for at risk foot care.  This patient requires this care by a professional since this patient will be at risk due to having diabetes.   This patient is unable to cut nails herself since the patient cannot reach her nails.These nails are painful walking and wearing shoes.  This patient presents for at risk foot care today. Patient presents to the office with her daughter.    General Appearance  Alert, conversant and in no acute stress.  Vascular  Dorsalis pedis and posterior tibial  pulses are weakly  palpable  bilaterally.  Capillary return is within normal limits  bilaterally. Temperature is within normal limits  bilaterally.  Neurologic  Senn-Weinstein monofilament wire test within normal limits  bilaterally. Muscle power within normal limits bilaterally.  Nails Thick disfigured discolored nails with subungual debris  from hallux to fifth toes bilaterally. No evidence of bacterial infection or drainage bilaterally.  Orthopedic  No limitations of motion  feet .  No crepitus or effusions noted.  No bony pathology or digital deformities noted.  HAV  B/L.  Pes planus.  Skin  normotropic skin with no porokeratosis noted bilaterally.  No signs of infections or ulcers noted.     Onychomycosis  Pain in right toes  Pain in left toes  Consent was obtained for treatment procedures.   Mechanical debridement of nails 1-5  bilaterally performed with a nail nipper.  Patient requests no dremel tool usage.  Patient to see Rick about diabetic shoes.   Return office visit      3 months               Told patient to return for periodic foot care and evaluation due to potential at risk complications.   Sussie Minor DPM  

## 2020-07-13 ENCOUNTER — Ambulatory Visit: Payer: Medicare HMO | Attending: Internal Medicine

## 2020-07-13 DIAGNOSIS — Z23 Encounter for immunization: Secondary | ICD-10-CM

## 2020-07-13 NOTE — Progress Notes (Signed)
   Covid-19 Vaccination Clinic  Name:  Jennifer Zhang    MRN: [id number] DOB: 31-Aug-1926  07/13/2020  [name] was observed post Covid-19 immunization for 15 minutes without incident. She was provided with Vaccine Information Sheet and instruction to access the V-Safe system.   [name] was instructed to call 911 with any severe reactions post vaccine: Marland Kitchen Difficulty breathing  . Swelling of face and throat  . A fast heartbeat  . A bad rash all over body  . Dizziness and weakness

## 2020-07-26 ENCOUNTER — Other Ambulatory Visit: Payer: Self-pay

## 2020-07-26 ENCOUNTER — Ambulatory Visit: Payer: Medicare HMO | Admitting: Orthotics

## 2020-07-26 DIAGNOSIS — M79672 Pain in left foot: Secondary | ICD-10-CM

## 2020-07-26 DIAGNOSIS — M79671 Pain in right foot: Secondary | ICD-10-CM

## 2020-07-26 DIAGNOSIS — E119 Type 2 diabetes mellitus without complications: Secondary | ICD-10-CM

## 2020-07-26 DIAGNOSIS — B351 Tinea unguium: Secondary | ICD-10-CM

## 2020-07-26 NOTE — Progress Notes (Signed)
Reordered shoes  

## 2020-08-17 DIAGNOSIS — M2550 Pain in unspecified joint: Secondary | ICD-10-CM | POA: Diagnosis not present

## 2020-08-17 DIAGNOSIS — E039 Hypothyroidism, unspecified: Secondary | ICD-10-CM | POA: Diagnosis not present

## 2020-08-17 DIAGNOSIS — E1165 Type 2 diabetes mellitus with hyperglycemia: Secondary | ICD-10-CM | POA: Diagnosis not present

## 2020-08-17 DIAGNOSIS — I2510 Atherosclerotic heart disease of native coronary artery without angina pectoris: Secondary | ICD-10-CM | POA: Diagnosis not present

## 2020-08-17 DIAGNOSIS — I10 Essential (primary) hypertension: Secondary | ICD-10-CM | POA: Diagnosis not present

## 2020-08-17 DIAGNOSIS — E7800 Pure hypercholesterolemia, unspecified: Secondary | ICD-10-CM | POA: Diagnosis not present

## 2020-08-25 ENCOUNTER — Ambulatory Visit: Payer: Medicare HMO | Admitting: Orthotics

## 2020-08-25 ENCOUNTER — Other Ambulatory Visit: Payer: Self-pay

## 2020-08-25 DIAGNOSIS — M79676 Pain in unspecified toe(s): Secondary | ICD-10-CM

## 2020-08-25 DIAGNOSIS — B351 Tinea unguium: Secondary | ICD-10-CM

## 2020-08-25 DIAGNOSIS — E119 Type 2 diabetes mellitus without complications: Secondary | ICD-10-CM

## 2020-08-25 NOTE — Progress Notes (Signed)
redordering shoes to size 8xw, but different type as the plastic laces just doesn't give enough room

## 2020-08-30 ENCOUNTER — Inpatient Hospital Stay (HOSPITAL_COMMUNITY)
Admission: EM | Admit: 2020-08-30 | Discharge: 2020-09-03 | DRG: 312 | Disposition: A | Discharge status: Skilled Nursing Facility | Payer: Medicare HMO | Attending: Internal Medicine | Admitting: Internal Medicine

## 2020-08-30 ENCOUNTER — Emergency Department (HOSPITAL_COMMUNITY): Payer: Medicare HMO

## 2020-08-30 ENCOUNTER — Other Ambulatory Visit: Payer: Self-pay

## 2020-08-30 ENCOUNTER — Observation Stay (HOSPITAL_COMMUNITY): Payer: Medicare HMO

## 2020-08-30 ENCOUNTER — Encounter (HOSPITAL_COMMUNITY): Payer: Self-pay

## 2020-08-30 ENCOUNTER — Observation Stay (HOSPITAL_BASED_OUTPATIENT_CLINIC_OR_DEPARTMENT_OTHER): Payer: Medicare HMO

## 2020-08-30 DIAGNOSIS — Z79899 Other long term (current) drug therapy: Secondary | ICD-10-CM

## 2020-08-30 DIAGNOSIS — Z20822 Contact with and (suspected) exposure to covid-19: Secondary | ICD-10-CM | POA: Diagnosis not present

## 2020-08-30 DIAGNOSIS — Z7401 Bed confinement status: Secondary | ICD-10-CM | POA: Diagnosis not present

## 2020-08-30 DIAGNOSIS — E039 Hypothyroidism, unspecified: Secondary | ICD-10-CM | POA: Diagnosis present

## 2020-08-30 DIAGNOSIS — R29818 Other symptoms and signs involving the nervous system: Secondary | ICD-10-CM | POA: Diagnosis not present

## 2020-08-30 DIAGNOSIS — D649 Anemia, unspecified: Secondary | ICD-10-CM | POA: Diagnosis not present

## 2020-08-30 DIAGNOSIS — E119 Type 2 diabetes mellitus without complications: Secondary | ICD-10-CM | POA: Diagnosis not present

## 2020-08-30 DIAGNOSIS — G459 Transient cerebral ischemic attack, unspecified: Secondary | ICD-10-CM

## 2020-08-30 DIAGNOSIS — E669 Obesity, unspecified: Secondary | ICD-10-CM | POA: Diagnosis not present

## 2020-08-30 DIAGNOSIS — Z91048 Other nonmedicinal substance allergy status: Secondary | ICD-10-CM | POA: Diagnosis not present

## 2020-08-30 DIAGNOSIS — I10 Essential (primary) hypertension: Secondary | ICD-10-CM | POA: Diagnosis not present

## 2020-08-30 DIAGNOSIS — Z794 Long term (current) use of insulin: Secondary | ICD-10-CM | POA: Diagnosis not present

## 2020-08-30 DIAGNOSIS — Z87891 Personal history of nicotine dependence: Secondary | ICD-10-CM | POA: Diagnosis not present

## 2020-08-30 DIAGNOSIS — Z9181 History of falling: Secondary | ICD-10-CM | POA: Diagnosis not present

## 2020-08-30 DIAGNOSIS — Z713 Dietary counseling and surveillance: Secondary | ICD-10-CM | POA: Diagnosis not present

## 2020-08-30 DIAGNOSIS — E876 Hypokalemia: Secondary | ICD-10-CM | POA: Diagnosis present

## 2020-08-30 DIAGNOSIS — M2550 Pain in unspecified joint: Secondary | ICD-10-CM | POA: Diagnosis not present

## 2020-08-30 DIAGNOSIS — Z6833 Body mass index (BMI) 33.0-33.9, adult: Secondary | ICD-10-CM | POA: Diagnosis not present

## 2020-08-30 DIAGNOSIS — W19XXXA Unspecified fall, initial encounter: Secondary | ICD-10-CM | POA: Diagnosis not present

## 2020-08-30 DIAGNOSIS — I6789 Other cerebrovascular disease: Secondary | ICD-10-CM | POA: Diagnosis not present

## 2020-08-30 DIAGNOSIS — I951 Orthostatic hypotension: Principal | ICD-10-CM | POA: Diagnosis present

## 2020-08-30 DIAGNOSIS — Z833 Family history of diabetes mellitus: Secondary | ICD-10-CM

## 2020-08-30 DIAGNOSIS — Z79890 Hormone replacement therapy (postmenopausal): Secondary | ICD-10-CM | POA: Diagnosis not present

## 2020-08-30 DIAGNOSIS — Z8673 Personal history of transient ischemic attack (TIA), and cerebral infarction without residual deficits: Secondary | ICD-10-CM

## 2020-08-30 DIAGNOSIS — I361 Nonrheumatic tricuspid (valve) insufficiency: Secondary | ICD-10-CM | POA: Diagnosis not present

## 2020-08-30 DIAGNOSIS — R42 Dizziness and giddiness: Secondary | ICD-10-CM | POA: Diagnosis not present

## 2020-08-30 DIAGNOSIS — I639 Cerebral infarction, unspecified: Secondary | ICD-10-CM | POA: Diagnosis not present

## 2020-08-30 DIAGNOSIS — Z886 Allergy status to analgesic agent status: Secondary | ICD-10-CM | POA: Diagnosis not present

## 2020-08-30 DIAGNOSIS — I340 Nonrheumatic mitral (valve) insufficiency: Secondary | ICD-10-CM | POA: Diagnosis not present

## 2020-08-30 DIAGNOSIS — E785 Hyperlipidemia, unspecified: Secondary | ICD-10-CM | POA: Diagnosis not present

## 2020-08-30 DIAGNOSIS — R531 Weakness: Secondary | ICD-10-CM | POA: Diagnosis not present

## 2020-08-30 DIAGNOSIS — R262 Difficulty in walking, not elsewhere classified: Secondary | ICD-10-CM | POA: Diagnosis not present

## 2020-08-30 DIAGNOSIS — H903 Sensorineural hearing loss, bilateral: Secondary | ICD-10-CM | POA: Diagnosis present

## 2020-08-30 DIAGNOSIS — R55 Syncope and collapse: Secondary | ICD-10-CM | POA: Diagnosis present

## 2020-08-30 DIAGNOSIS — Z90710 Acquired absence of both cervix and uterus: Secondary | ICD-10-CM | POA: Diagnosis not present

## 2020-08-30 DIAGNOSIS — W19XXXS Unspecified fall, sequela: Secondary | ICD-10-CM | POA: Diagnosis not present

## 2020-08-30 DIAGNOSIS — I6389 Other cerebral infarction: Secondary | ICD-10-CM | POA: Diagnosis not present

## 2020-08-30 LAB — COMPREHENSIVE METABOLIC PANEL
ALT: 16 U/L (ref 0–44)
AST: 19 U/L (ref 15–41)
Albumin: 4.4 g/dL (ref 3.5–5.0)
Alkaline Phosphatase: 67 U/L (ref 38–126)
Anion gap: 10 (ref 5–15)
BUN: 20 mg/dL (ref 8–23)
CO2: 27 mmol/L (ref 22–32)
Calcium: 9.4 mg/dL (ref 8.9–10.3)
Chloride: 104 mmol/L (ref 98–111)
Creatinine, Ser: 0.9 mg/dL (ref 0.44–1.00)
GFR, Estimated: 60 mL/min — ABNORMAL LOW (ref >60)
Glucose, Bld: 144 mg/dL — ABNORMAL HIGH (ref 70–99)
Potassium: 4.2 mmol/L (ref 3.5–5.1)
Sodium: 141 mmol/L (ref 135–145)
Total Bilirubin: 0.5 mg/dL (ref 0.3–1.2)
Total Protein: 7.6 g/dL (ref 6.5–8.1)

## 2020-08-30 LAB — PROTIME-INR
INR: 1 (ref 0.8–1.2)
Prothrombin Time: 12.4 seconds (ref 11.4–15.2)

## 2020-08-30 LAB — URINALYSIS, ROUTINE W REFLEX MICROSCOPIC
Bilirubin Urine: NEGATIVE
Glucose, UA: NEGATIVE mg/dL
Hgb urine dipstick: NEGATIVE
Ketones, ur: NEGATIVE mg/dL
Nitrite: NEGATIVE
Protein, ur: NEGATIVE mg/dL
Specific Gravity, Urine: 1.012 (ref 1.005–1.030)
pH: 7 (ref 5.0–8.0)

## 2020-08-30 LAB — CBC
HCT: 36.7 % (ref 36.0–46.0)
Hemoglobin: 11.8 g/dL — ABNORMAL LOW (ref 12.0–15.0)
MCH: 29.5 pg (ref 26.0–34.0)
MCHC: 32.2 g/dL (ref 30.0–36.0)
MCV: 91.8 fL (ref 80.0–100.0)
Platelets: 195 10*3/uL (ref 150–400)
RBC: 4 MIL/uL (ref 3.87–5.11)
RDW: 15.9 % — ABNORMAL HIGH (ref 11.5–15.5)
WBC: 8.3 10*3/uL (ref 4.0–10.5)
nRBC: 0 % (ref 0.0–0.2)

## 2020-08-30 LAB — DIFFERENTIAL
Abs Immature Granulocytes: 0.04 10*3/uL (ref 0.00–0.07)
Basophils Absolute: 0 10*3/uL (ref 0.0–0.1)
Basophils Relative: 1 %
Eosinophils Absolute: 0.1 10*3/uL (ref 0.0–0.5)
Eosinophils Relative: 2 %
Immature Granulocytes: 1 %
Lymphocytes Relative: 19 %
Lymphs Abs: 1.6 10*3/uL (ref 0.7–4.0)
Monocytes Absolute: 0.6 10*3/uL (ref 0.1–1.0)
Monocytes Relative: 7 %
Neutro Abs: 6 10*3/uL (ref 1.7–7.7)
Neutrophils Relative %: 70 %

## 2020-08-30 LAB — GLUCOSE, CAPILLARY: Glucose-Capillary: 119 mg/dL — ABNORMAL HIGH (ref 70–99)

## 2020-08-30 LAB — LIPID PANEL
Cholesterol: 170 mg/dL (ref 0–200)
HDL: 69 mg/dL (ref >40)
LDL Cholesterol: 93 mg/dL (ref 0–99)
Total CHOL/HDL Ratio: 2.5 RATIO
Triglycerides: 39 mg/dL (ref <150)
VLDL: 8 mg/dL (ref 0–40)

## 2020-08-30 LAB — RAPID URINE DRUG SCREEN, HOSP PERFORMED
Amphetamines: NOT DETECTED
Barbiturates: NOT DETECTED
Benzodiazepines: NOT DETECTED
Cocaine: NOT DETECTED
Opiates: NOT DETECTED
Tetrahydrocannabinol: NOT DETECTED

## 2020-08-30 LAB — HEMOGLOBIN A1C
Hgb A1c MFr Bld: 7.5 % — ABNORMAL HIGH (ref 4.8–5.6)
Mean Plasma Glucose: 168.55 mg/dL

## 2020-08-30 LAB — RESP PANEL BY RT-PCR (FLU A&B, COVID) ARPGX2
Influenza A by PCR: NEGATIVE
Influenza B by PCR: NEGATIVE
SARS Coronavirus 2 by RT PCR: NEGATIVE

## 2020-08-30 LAB — ETHANOL: Alcohol, Ethyl (B): <10 mg/dL (ref <10)

## 2020-08-30 LAB — APTT: aPTT: 32 seconds (ref 24–36)

## 2020-08-30 MED ORDER — ACETAMINOPHEN 160 MG/5ML PO SOLN: 650.0000 mg | ORAL | Status: DC | PRN

## 2020-08-30 MED ORDER — VERAPAMIL HCL ER 240 MG PO TBCR
240.0000 mg | EXTENDED_RELEASE_TABLET | Freq: Every day | ORAL | Status: DC
  Administered 2020-08-30 – 2020-09-03 (×5): 240 mg via ORAL
  Filled 2020-08-30 (×5): qty 1

## 2020-08-30 MED ORDER — ENOXAPARIN SODIUM 40 MG/0.4ML ~~LOC~~ SOLN
40.0000 mg | SUBCUTANEOUS | Status: DC
  Administered 2020-08-30 – 2020-09-02 (×4): 40 mg via SUBCUTANEOUS
  Filled 2020-08-30 (×4): qty 0.4

## 2020-08-30 MED ORDER — ACETAMINOPHEN 650 MG RE SUPP: 650.0000 mg | RECTAL | Status: DC | PRN

## 2020-08-30 MED ORDER — LORAZEPAM 2 MG/ML IJ SOLN
0.5000 mg | Freq: Once | INTRAMUSCULAR | Status: AC | PRN
  Administered 2020-08-30: 0.5 mg via INTRAVENOUS
  Filled 2020-08-30: qty 1

## 2020-08-30 MED ORDER — ISOSORBIDE MONONITRATE 10 MG PO TABS
10.0000 mg | ORAL_TABLET | Freq: Two times a day (BID) | ORAL | Status: DC
  Administered 2020-08-30 – 2020-09-03 (×8): 10 mg via ORAL
  Filled 2020-08-30 (×9): qty 1

## 2020-08-30 MED ORDER — LISINOPRIL-HYDROCHLOROTHIAZIDE 20-25 MG PO TABS: 1.0000 | ORAL_TABLET | Freq: Every day | ORAL | Status: DC

## 2020-08-30 MED ORDER — ATORVASTATIN CALCIUM 40 MG PO TABS
40.0000 mg | ORAL_TABLET | Freq: Every day | ORAL | Status: DC
  Administered 2020-08-30 – 2020-09-02 (×4): 40 mg via ORAL
  Filled 2020-08-30 (×4): qty 1

## 2020-08-30 MED ORDER — MECLIZINE HCL 25 MG PO TABS
12.5000 mg | ORAL_TABLET | Freq: Once | ORAL | Status: AC
  Administered 2020-08-30: 12.5 mg via ORAL
  Filled 2020-08-30: qty 1

## 2020-08-30 MED ORDER — INSULIN GLARGINE 100 UNIT/ML ~~LOC~~ SOLN
10.0000 [IU] | Freq: Every day | SUBCUTANEOUS | Status: DC
  Administered 2020-08-30 – 2020-09-02 (×4): 10 [IU] via SUBCUTANEOUS
  Filled 2020-08-30 (×5): qty 0.1

## 2020-08-30 MED ORDER — SODIUM CHLORIDE 0.9 % IV SOLN
1000.0000 mL | INTRAVENOUS | Status: DC
  Administered 2020-08-30 – 2020-09-01 (×4): 1000 mL via INTRAVENOUS

## 2020-08-30 MED ORDER — HYDROCHLOROTHIAZIDE 25 MG PO TABS
25.0000 mg | ORAL_TABLET | Freq: Every day | ORAL | Status: DC
  Administered 2020-08-30 – 2020-09-01 (×3): 25 mg via ORAL
  Filled 2020-08-30 (×3): qty 1

## 2020-08-30 MED ORDER — INSULIN ASPART 100 UNIT/ML ~~LOC~~ SOLN
0.0000 [IU] | Freq: Three times a day (TID) | SUBCUTANEOUS | Status: DC
  Administered 2020-08-31: 1 [IU] via SUBCUTANEOUS
  Administered 2020-08-31: 2 [IU] via SUBCUTANEOUS
  Administered 2020-08-31: 1 [IU] via SUBCUTANEOUS
  Administered 2020-09-01: 2 [IU] via SUBCUTANEOUS
  Administered 2020-09-01: 1 [IU] via SUBCUTANEOUS

## 2020-08-30 MED ORDER — NITROGLYCERIN 0.4 MG SL SUBL: 0.4000 mg | SUBLINGUAL_TABLET | SUBLINGUAL | Status: DC | PRN

## 2020-08-30 MED ORDER — STROKE: EARLY STAGES OF RECOVERY BOOK
Freq: Once | Status: AC
  Filled 2020-08-30: qty 1

## 2020-08-30 MED ORDER — ACETAMINOPHEN 325 MG PO TABS
650.0000 mg | ORAL_TABLET | ORAL | Status: DC | PRN
  Administered 2020-09-01: 650 mg via ORAL
  Filled 2020-08-30: qty 2

## 2020-08-30 MED ORDER — SODIUM CHLORIDE 0.9 % IV BOLUS (SEPSIS)
500.0000 mL | Freq: Once | INTRAVENOUS | Status: AC
  Administered 2020-08-30: 500 mL via INTRAVENOUS

## 2020-08-30 MED ORDER — LISINOPRIL 20 MG PO TABS
20.0000 mg | ORAL_TABLET | Freq: Every day | ORAL | Status: DC
  Administered 2020-08-30 – 2020-09-03 (×5): 20 mg via ORAL
  Filled 2020-08-30 (×5): qty 1

## 2020-08-30 MED ORDER — LEVOTHYROXINE SODIUM 50 MCG PO TABS
50.0000 ug | ORAL_TABLET | Freq: Every day | ORAL | Status: DC
  Administered 2020-09-01 – 2020-09-03 (×3): 50 ug via ORAL
  Filled 2020-08-30 (×4): qty 1

## 2020-08-30 NOTE — ED Notes (Signed)
Patient back from MRI.

## 2020-08-30 NOTE — ED Notes (Signed)
Patient's daughter took belongings home when she left.

## 2020-08-30 NOTE — ED Notes (Signed)
Patient transported to MRI 

## 2020-08-30 NOTE — H&P (Signed)
History and Physical        Hospital Admission Note Date: 08/30/2020  Patient name: Jennifer Zhang Medical record number: [id number] Date of birth: Nov 17, 1925 Age: 86 y.o. Gender: female  PCP: Benito Mccreedy, MD  Patient coming from: home Lives with: alone At baseline, ambulates: walker  Chief Complaint    Chief Complaint  Patient presents with  . Dizziness      HPI:   This is a 86 year old female with past medical history of diabetes, hypertension, hypothyroidism who presented to the ED after feeling lightheadedness falling when waking up this morning.  Patient fell back onto her bed when getting out of bed and was unable to get up. Had generalized weakness. She lives by herself at home and called EMS to help her get up. No slurred speech, no focal weakness otherwise, no visual changes, no headache no chest pain or shortness of breath, fever or chills or any other issues.  Had an episode like this in the past patient was admitted for presyncopal episode back in March had nausea and vomiting at that time that was thought to be related to hyperglycemia and dehydration.   ED Course: Afebrile, hemodynamically stable, on room air.  Labs overall unremarkable. Notable Imaging: CT head concerning for possible acute/subacute left cerebellar infarct with additional remote appearing bilateral cerebellar infarcts.. Patient received Antivert.    Vitals:   08/30/20 1613 08/30/20 1659  BP: (!) 160/79   Pulse: 78   Resp: 20   Temp: 98.2 F (36.8 C)   SpO2: 97% 98%     Review of Systems:  Review of Systems  All other systems reviewed and are negative.   Medical/Social/Family History   Past Medical History: Past Medical History:  Diagnosis Date  . Diabetes mellitus without complication ([place])   . Hypertension   . Thyroid disease     Past Surgical History:  Procedure  Laterality Date  . ABDOMINAL HYSTERECTOMY    . FOOT SURGERY Right 1960    Medications: Prior to Admission medications   Medication Sig Start Date End Date Taking? Authorizing Provider  atorvastatin (LIPITOR) 40 MG tablet Take 40 mg by mouth at bedtime.  04/01/18  Yes [provider]  insulin aspart protamine - aspart (NOVOLOG MIX 70/30 FLEXPEN) (70-30) 100 UNIT/ML FlexPen Inject 25 Units into the skin 2 (two) times daily with a meal.  11/12/18  Yes [provider]  isosorbide mononitrate (ISMO,MONOKET) 10 MG tablet Take 10 mg by mouth 2 (two) times daily. 05/23/18  Yes [provider]  levothyroxine (SYNTHROID) 50 MCG tablet Take 50 mcg by mouth daily before breakfast.  11/07/18  Yes [provider]  lisinopril-hydrochlorothiazide (ZESTORETIC) 20-25 MG tablet Take 1 tablet by mouth daily.  11/23/18  Yes [provider]  nitroGLYCERIN (NITROSTAT) 0.4 MG SL tablet Place 0.4 mg under the tongue every 5 (five) minutes as needed for chest pain.  04/03/18  Yes [provider]  Olopatadine HCl 0.2 % SOLN Place 1 drop into both eyes daily.  06/01/18  Yes [provider]  verapamil (CALAN-SR) 240 MG CR tablet Take 240 mg by mouth daily.   Yes [provider]  Accu-Chek FastClix Lancets MISC  09/19/18  [provider]  ACCU-CHEK GUIDE test strip 1 each by Other route. 2-3 02/01/19   [provider]  Alcohol Swabs (ALCOHOL PREP) PADS  06/04/18   [provider]  B-D UF III MINI PEN NEEDLES 31G X 5 MM MISC See admin instructions. 05/29/18   [provider]  BD INSULIN SYRINGE U/F 31G X 5/16" 0.5 ML MISC  10/31/18   [provider]  Blood Glucose Monitoring Suppl (ACCU-CHEK GUIDE) w/Device KIT  09/19/18   [provider]  Incontinence Supply Disposable (CLEANSING CLOTHS FLUSHABLE) MISC  06/04/18   [provider]  UNABLE TO [place] 06/04/18   [provider]     Allergies:   Allergies  Allergen Reactions  . Tape Other (See Comments)    SKIN WILL TEAR EASILY!!  . Aspirin Palpitations and Other (See Comments)    Heart flutters    Social History:  reports that she has quit smoking. She has quit using smokeless tobacco. She reports that she does not drink alcohol and does not use drugs.  Family History: Family History  Problem Relation Age of Onset  . Cancer Mother   . Hypertension Mother   . Migraines Mother   . Breast cancer Mother   . Diabetes Brother      Objective   Physical Exam: Blood pressure (!) 160/79, pulse 78, temperature 98.2 F (36.8 C), temperature source Oral, resp. rate 20, height 5' (1.524 m), weight 76.8 kg, SpO2 98 %.  Physical Exam Vitals and nursing note reviewed.  Constitutional:      Appearance: Normal appearance.  HENT:     Head: Normocephalic and atraumatic.  Eyes:     Conjunctiva/sclera: Conjunctivae normal.  Cardiovascular:     Rate and Rhythm: Normal rate and regular rhythm.     Heart sounds: Murmur heard.  Systolic murmur is present with a grade of 2/6.   Pulmonary:     Effort: Pulmonary effort is normal.     Breath sounds: Normal breath sounds.  Abdominal:     General: Abdomen is flat.     Palpations: Abdomen is soft.  Musculoskeletal:        General: No swelling or tenderness.  Skin:    Coloration: Skin is not jaundiced or pale.  Neurological:     Mental Status: She is alert. Mental status is at baseline.  Psychiatric:        Mood and Affect: Mood normal.        Behavior: Behavior normal.     LABS on Admission: I have personally reviewed all the labs and imaging below    Basic Metabolic Panel: Recent Labs  Lab 08/30/20 1022  NA 141  K 4.2  CL 104  CO2 27  GLUCOSE 144*  BUN 20  CREATININE 0.90  CALCIUM 9.4   Liver Function Tests: Recent Labs  Lab 08/30/20 1022  AST 19  ALT 16  ALKPHOS 67  BILITOT 0.5  PROT 7.6  ALBUMIN 4.4   No results for input(s):  LIPASE, AMYLASE in the last 168 hours. No results for input(s): AMMONIA in the last 168 hours. CBC: Recent Labs  Lab 08/30/20 1022  WBC 8.3  NEUTROABS 6.0  HGB 11.8*  HCT 36.7  MCV 91.8  PLT 195   Cardiac Enzymes: No results for input(s): CKTOTAL, CKMB, CKMBINDEX, TROPONINI in the last 168 hours. BNP: Invalid input(s): POCBNP CBG: No results for input(s): GLUCAP in the last 168 hours.  Radiological Exams on Admission:  CT HEAD WO CONTRAST  Result Date: 08/30/2020 CLINICAL DATA:  Focal neuro deficit, acute stroke suspected. Dizziness. EXAM: CT HEAD WITHOUT CONTRAST TECHNIQUE: Contiguous axial images were obtained from the base of the skull through the vertex without intravenous contrast. COMPARISON:  None. FINDINGS: Brain: No evidence of acute hemorrhage, hydrocephalus, extra-axial collection or mass lesion/mass effect. Age indeterminate left cerebellar hemisphere infarct (see series 2, image 8 and series 6, image 39), possibly acute/subacute. There are additional remote appearing bilateral cerebellar infarcts, largest on the right. Advanced confluent white matter hypoattenuation bilaterally, compatible with chronic microvascular ischemic disease. Mild diffuse cerebral volume loss with ex vacuo ventricular dilation. No acute hemorrhage. No hydrocephalus. No mass lesion or abnormal mass effect. Vascular: Calcific atherosclerosis. No definite hyperdense vessel identified. Skull: No acute fracture. Left frontal outer calvarial table hyperostosis. Sinuses/Orbits: Visualized sinuses are clear.  Unremarkable orbits. Other: No mastoid effusions. IMPRESSION: 1. Age indeterminate, possibly acute/subacute left cerebellar infarct. Recommend MRI to further evaluate. 2. There are additional remote appearing bilateral cerebellar infarcts. 3. Advanced confluent chronic microvascular ischemic disease. Electronically Signed   By: Margaretha Sheffield MD   On: 08/30/2020 10:45   MR BRAIN WO CONTRAST  Result  Date: 08/30/2020 CLINICAL DATA:  Dizziness EXAM: MRI HEAD WITHOUT CONTRAST TECHNIQUE: Multiplanar, multiecho pulse sequences of the brain and surrounding structures were obtained without intravenous contrast. COMPARISON:  None. FINDINGS: Brain: There is no acute infarction or intracranial hemorrhage. Prominence of the ventricles and sulci reflects generalized parenchymal volume loss. Confluent areas of T2 hyperintensity in the supratentorial and pontine white matter nonspecific but probably reflect advanced chronic microvascular ischemic changes. There are chronic small vessel infarcts of the central gray nuclei and bilateral cerebellum. Punctate focus of susceptibility in the left corona radiata likely reflects a chronic microhemorrhage. There is no intracranial mass, mass effect, or edema. There is no hydrocephalus or extra-axial fluid collection. Vascular: Major vessel flow voids at the skull base are preserved. Skull and upper cervical spine: Marrow signal is overall preserved. There are advanced degenerative changes at C3-C4. Sinuses/Orbits: Paranasal sinuses are aerated. Orbits are unremarkable. Other: Sella is unremarkable.  Mastoid air cells are clear. IMPRESSION: No evidence of recent infarction, hemorrhage, or mass. Advanced chronic microvascular ischemic changes. Chronic small vessel infarcts. Electronically Signed   By: Macy Mis M.D.   On: 08/30/2020 14:19   VAS US CAROTID  Result Date: 08/30/2020 Carotid Arterial Duplex Study Indications:       TIA. Risk Factors:      None. Comparison Study:  No prior studies. Performing Technologist: Oliver Hum RVT  Examination Guidelines: A complete evaluation includes B-mode imaging, spectral Doppler, color Doppler, and power Doppler as needed of all accessible portions of each vessel. Bilateral testing is considered an integral part of a complete examination. Limited examinations for reoccurring indications may be performed as noted.  Right Carotid  Findings: +----------+--------+--------+--------+-----------------------+--------+           PSV cm/sEDV cm/sStenosisPlaque Description     Comments +----------+--------+--------+--------+-----------------------+--------+ CCA Prox  73      12              smooth and heterogenous         +----------+--------+--------+--------+-----------------------+--------+ CCA Distal43      10              smooth and heterogenous         +----------+--------+--------+--------+-----------------------+--------+ ICA Prox  52      8  calcific                        +----------+--------+--------+--------+-----------------------+--------+ ICA Distal65      20                                     tortuous +----------+--------+--------+--------+-----------------------+--------+ ECA       60      4                                               +----------+--------+--------+--------+-----------------------+--------+ +----------+--------+-------+--------+-------------------+           PSV cm/sEDV cmsDescribeArm Pressure (mmHG) +----------+--------+-------+--------+-------------------+ [id number]                                        +----------+--------+-------+--------+-------------------+ +---------+--------+--+--------+--+---------+ VertebralPSV cm/s48EDV cm/s10Antegrade +---------+--------+--+--------+--+---------+  Left Carotid Findings: +----------+--------+--------+--------+-----------------------+--------+           PSV cm/sEDV cm/sStenosisPlaque Description     Comments +----------+--------+--------+--------+-----------------------+--------+ CCA Prox  117     17              smooth and heterogenoustortuous +----------+--------+--------+--------+-----------------------+--------+ CCA Distal35      9               smooth and heterogenous         +----------+--------+--------+--------+-----------------------+--------+ ICA Prox  40       12              smooth and heterogenoustortuous +----------+--------+--------+--------+-----------------------+--------+ ICA Distal34      10                                     tortuous +----------+--------+--------+--------+-----------------------+--------+ ECA       51      4                                               +----------+--------+--------+--------+-----------------------+--------+ +----------+--------+--------+--------+-------------------+           PSV cm/sEDV cm/sDescribeArm Pressure (mmHG) +----------+--------+--------+--------+-------------------+ [id number]                                         +----------+--------+--------+--------+-------------------+ +---------+--------+--+--------+-+---------+ VertebralPSV cm/s40EDV cm/s9Antegrade +---------+--------+--+--------+-+---------+   Summary: Right Carotid: Velocities in the right ICA are consistent with a 1-39% stenosis. Left Carotid: Velocities in the left ICA are consistent with a 1-39% stenosis. Vertebrals: Bilateral vertebral arteries demonstrate antegrade flow. *See table(s) above for measurements and observations.  Electronically signed by Ruta Hinds MD on 08/30/2020 at 3:54:16 PM.    Final       EKG: Independently reviewed   A & P   Principal Problem:   Near syncope Active Problems:   Type 2 diabetes mellitus without complication ([facility])   Hypothyroidism   Dizziness   1. Fall/Presyncopal episode concerning for TIA vs. Orthostatic hypotension  a. CT possible acute cerebellar stroke b. MRI negative for acute  stroke but does show chronic cerebellar strokes c. Echo d. Lipid panel e. HbA1c f. Discussed with Dr. Sarita Haver, neuro, who also recommended Carotid duplex, TSH and AM cortisol. Stated that if needed she could be on plavix due to aspirin on her allergy list but would be cautious due to her history of falls.  g. PT eval h. Orthostatic  vitals  2. Hypothyroidism a. TSH  3. Hypertension a. Continue home meds  4. Diabetes a. Lantus and sliding scale   DVT prophylaxis: lovenox   Code Status: Full Code  Diet: Heart healthy/carb modified Family Communication: Admission, patients condition and plan of care including tests being ordered have been discussed with the patient who indicates understanding and agrees with the plan and Code Status.  Disposition Plan: The appropriate patient status for this patient is OBSERVATION. Observation status is judged to be reasonable and necessary in order to provide the required intensity of service to ensure the patient's safety. The patient's presenting symptoms, physical exam findings, and initial radiographic and laboratory data in the context of their medical condition is felt to place them at decreased risk for further clinical deterioration. Furthermore, it is anticipated that the patient will be medically stable for discharge from the hospital within 2 midnights of admission. The following factors support the patient status of observation.   " The patient's presenting symptoms include generalized weakness, fall. " The physical exam findings include murmur. " The initial radiographic and laboratory data are unremarkable for acute stroke.      Consultants  . Discussed with neuro over the phone  Procedures  . none  Time Spent on Admission: 60 minutes    Harold Hedge, DO [facility]  08/30/2020, 5:36 PM

## 2020-08-30 NOTE — ED Notes (Signed)
Patient back from CT.

## 2020-08-30 NOTE — ED Triage Notes (Signed)
Pt arrived via EMS, from home, got up this morning, felt dizzy, lowered herself to ground, cont with dizziness, unable to get up. EMS helped pt up, states she still was very dizzy, "room spinning". Pt denies any other issues.

## 2020-08-30 NOTE — ED Notes (Signed)
Bed alarm was placed and activated for pt's safety.  

## 2020-08-30 NOTE — Progress Notes (Signed)
Carotid artery duplex has been completed. Preliminary results can be found in CV Proc through chart review.   08/30/20 3:33 PM Olen Cordial RVT

## 2020-08-30 NOTE — ED Provider Notes (Signed)
[facility] DEPT Provider Note   CSN: [id number] Arrival date & time: 08/30/20  2202     History Chief Complaint  Patient presents with  . Dizziness    Jennifer Zhang is a 86 y.o. female.  HPI   Patient presents to the ED for evaluation of dizziness.  Patient states she was getting up this morning when she felt lightheaded and dizzy and fell back onto her bed.  Patient just felt like something was pulling her down.  She did not feel like it was on one side of her body versus the other specifically.  She was unable to stand up.  She lives by herself at home and called EMS who helped the patient up.  Patient initially said that she felt like the room was spinning but she does not feel like that now.  Patient denies any trouble with her speech.  She does not feel weak on one side of her body than the other.  She is not having any trouble with her vision.  She denies any headache.  She is not having any trouble with chest pain or shortness of breath.  No fevers or chills.  No vomiting or diarrhea.  Patient states she did have an episode like this in the past.  She said she was admitted to the hospital.  She does not remember the details.  According to the medical records patient was admitted back on March 2021.  She was admitted for presyncopal episode.  Patient was having issues with nausea vomiting during that time.  Patient symptoms were felt to be related to hyperglycemia and dehydration.  Past Medical History:  Diagnosis Date  . Diabetes mellitus without complication ([place])   . Hypertension   . Thyroid disease     Patient Active Problem List   Diagnosis Date Noted  . Hav (hallux abducto valgus), unspecified laterality 03/30/2020  . Pes planus 03/30/2020  . Near syncope 12/22/2019  . Type 2 diabetes mellitus without complication ([place]) 54/27/0623  . HTN (hypertension) 12/21/2019  . Hypothyroidism 12/21/2019  . Chronic swimmer's ear of both sides  12/05/2018  . Impacted cerumen of both ears 12/05/2018  . Sensorineural hearing loss (SNHL), bilateral 12/05/2018    Past Surgical History:  Procedure Laterality Date  . ABDOMINAL HYSTERECTOMY    . FOOT SURGERY Right 1960     OB History   No obstetric history on file.     Family History  Problem Relation Age of Onset  . Cancer Mother   . Hypertension Mother   . Migraines Mother   . Breast cancer Mother   . Diabetes Brother     Social History   Tobacco Use  . Smoking status: Former Research scientist (life sciences)  . Smokeless tobacco: Former Network engineer Use Topics  . Alcohol use: No  . Drug use: No    Home Medications Prior to Admission medications   Medication Sig Start Date End Date Taking? Authorizing Provider  atorvastatin (LIPITOR) 40 MG tablet Take 40 mg by mouth at bedtime.  04/01/18  Yes [provider]  insulin aspart protamine - aspart (NOVOLOG MIX 70/30 FLEXPEN) (70-30) 100 UNIT/ML FlexPen Inject 25 Units into the skin 2 (two) times daily with a meal.  11/12/18  Yes [provider]  isosorbide mononitrate (ISMO,MONOKET) 10 MG tablet Take 10 mg by mouth 2 (two) times daily. 05/23/18  Yes [provider]  levothyroxine (SYNTHROID) 50 MCG tablet Take 50 mcg by mouth daily before breakfast.  11/07/18  Yes [provider]  lisinopril-hydrochlorothiazide (ZESTORETIC) 20-25 MG tablet Take 1 tablet by mouth daily.  11/23/18  Yes [provider]  nitroGLYCERIN (NITROSTAT) 0.4 MG SL tablet Place 0.4 mg under the tongue every 5 (five) minutes as needed for chest pain.  04/03/18  Yes [provider]  Olopatadine HCl 0.2 % SOLN Place 1 drop into both eyes daily.  06/01/18  Yes [provider]  verapamil (CALAN-SR) 240 MG CR tablet Take 240 mg by mouth daily.   Yes [provider]  Accu-Chek FastClix Lancets MISC  09/19/18   [provider]  ACCU-CHEK GUIDE test strip 1 each by Other route. 2-3 02/01/19   [provider]  Alcohol Swabs (ALCOHOL PREP) PADS  06/04/18   [provider]  B-D UF III MINI PEN NEEDLES 31G X 5 MM MISC See admin instructions. 05/29/18   [provider]  BD INSULIN SYRINGE U/F 31G X 5/16" 0.5 ML MISC  10/31/18   [provider]  Blood Glucose Monitoring Suppl (ACCU-CHEK GUIDE) w/Device KIT  09/19/18   [provider]  Incontinence Supply Disposable (CLEANSING CLOTHS FLUSHABLE) MISC  06/04/18   [provider]  UNABLE TO [place] 06/04/18   [provider]    Allergies    Tape and Aspirin  Review of Systems   Review of Systems  All other systems reviewed and are negative.   Physical Exam Updated Vital Signs BP (!) 146/73   Pulse 80   Temp 98.3 F (36.8 C) (Oral)   Resp 19   SpO2 100%   Physical Exam Vitals and nursing note reviewed.  Constitutional:      General: She is not in acute distress.    Appearance: She is well-developed.  HENT:     Head: Normocephalic and atraumatic.     Right Ear: External ear normal.     Left Ear: External ear normal.  Eyes:     General: No scleral icterus.       Right eye: No discharge.        Left eye: No discharge.     Conjunctiva/sclera: Conjunctivae normal.  Neck:     Trachea: No tracheal deviation.  Cardiovascular:     Rate and Rhythm: Normal rate and regular rhythm.  Pulmonary:     Effort: Pulmonary effort is normal. No respiratory distress.     Breath sounds: Normal breath sounds. No stridor. No wheezing or rales.  Abdominal:     General: Bowel sounds are normal. There is no distension.     Palpations: Abdomen is soft.     Tenderness: There is no abdominal tenderness. There is no guarding or rebound.  Musculoskeletal:        General: No tenderness.     Cervical back: Neck supple.  Skin:    General: Skin is warm and dry.     Findings: No rash.  Neurological:     Mental Status: She is alert and oriented to person, place, and time.     Cranial  Nerves: No cranial nerve deficit (No facial droop, extraocular movements intact, tongue midline ).     Sensory: No sensory deficit.     Motor: No abnormal muscle tone or seizure activity.     Coordination: Coordination normal.     Comments: No pronator drift bilateral upper extrem, able to hold both legs off bed for 5 seconds, sensation intact in all extremities, no visual field cuts, no left or  right sided neglect, normal finger-nose exam bilaterally, no nystagmus noted      ED Results / Procedures / Treatments   Labs (all labs ordered are listed, but only abnormal results are displayed) Labs Reviewed  CBC - Abnormal; Notable for the following components:      Result Value   Hemoglobin 11.8 (*)    RDW 15.9 (*)    All other components within normal limits  COMPREHENSIVE METABOLIC PANEL - Abnormal; Notable for the following components:   Glucose, Bld 144 (*)    GFR, Estimated 60 (*)    All other components within normal limits  URINALYSIS, ROUTINE W REFLEX MICROSCOPIC - Abnormal; Notable for the following components:   Leukocytes,Ua SMALL (*)    Bacteria, UA RARE (*)    All other components within normal limits  RESP PANEL BY RT-PCR (FLU A&B, COVID) ARPGX2  ETHANOL  PROTIME-INR  APTT  DIFFERENTIAL  RAPID URINE DRUG SCREEN, HOSP PERFORMED    EKG EKG Interpretation  Date/Time:  Monday August 30 2020 10:04:44 EST Ventricular Rate:  80 PR Interval:    QRS Duration: 77 QT Interval:  405 QTC Calculation: 468 R Axis:   -14 Text Interpretation: Sinus rhythm Probable left atrial enlargement Low voltage, precordial leads Left ventricular hypertrophy No significant change since last tracing Confirmed by Dorie Rank [phone]) on 08/30/2020 10:32:07 AM   Radiology CT HEAD WO CONTRAST  Result Date: 08/30/2020 CLINICAL DATA:  Focal neuro deficit, acute stroke suspected. Dizziness. EXAM: CT HEAD WITHOUT CONTRAST TECHNIQUE: Contiguous axial images were obtained from the base of the  skull through the vertex without intravenous contrast. COMPARISON:  None. FINDINGS: Brain: No evidence of acute hemorrhage, hydrocephalus, extra-axial collection or mass lesion/mass effect. Age indeterminate left cerebellar hemisphere infarct (see series 2, image 8 and series 6, image 39), possibly acute/subacute. There are additional remote appearing bilateral cerebellar infarcts, largest on the right. Advanced confluent white matter hypoattenuation bilaterally, compatible with chronic microvascular ischemic disease. Mild diffuse cerebral volume loss with ex vacuo ventricular dilation. No acute hemorrhage. No hydrocephalus. No mass lesion or abnormal mass effect. Vascular: Calcific atherosclerosis. No definite hyperdense vessel identified. Skull: No acute fracture. Left frontal outer calvarial table hyperostosis. Sinuses/Orbits: Visualized sinuses are clear.  Unremarkable orbits. Other: No mastoid effusions. IMPRESSION: 1. Age indeterminate, possibly acute/subacute left cerebellar infarct. Recommend MRI to further evaluate. 2. There are additional remote appearing bilateral cerebellar infarcts. 3. Advanced confluent chronic microvascular ischemic disease. Electronically Signed   By: Margaretha Sheffield MD   On: 08/30/2020 10:45    Procedures Procedures (including critical care time)  Medications Ordered in ED Medications  sodium chloride 0.9 % bolus 500 mL (0 mLs Intravenous Stopped 08/30/20 1142)    Followed by  0.9 %  sodium chloride infusion (1,000 mLs Intravenous New Bag/Given 08/30/20 1038)  LORazepam (ATIVAN) injection 0.5 mg (has no administration in time range)  meclizine (ANTIVERT) tablet 12.5 mg (12.5 mg Oral Given 08/30/20 1023)    ED Course  I have reviewed the triage vital signs and the nursing notes.  Pertinent labs & imaging results that were available during my care of the patient were reviewed by me and considered in my medical decision making (see chart for details).  Clinical  Course as of Aug 30 1245  Mon Aug 30, 2020  1149 Labs reviewed.  Metabolic panel normal.  BC normal.   [JK]  1149 CT scan shows abnormality concerning for the possibility of infarct.   [JK]    Clinical Course  User Index [JK] Dorie Rank, MD   MDM Rules/Calculators/A&P               NIH Stroke Scale: 0           Patient presented to the ED for evaluation of dizziness.  Laboratory test reassuring.  No signs of anemia or dehydration.  No findings to suggest infection.  Vital signs are reassuring.  No signs of hypotension.  Presentation concerning for the possibility of TIA or stroke.  Patient's CT scan does show abnormality in the posterior circulation.  MRI recommended.  Symptoms are mild and presentation.  NIH stroke scale is 0 on my exam.  Not a TPA candidate.  Will proceed with MRI and consult with medical service for admission.  Consider neurology consultation. Final Clinical Impression(s) / ED Diagnoses Final diagnoses:  Dizziness     Dorie Rank, MD 08/30/20 1246

## 2020-08-30 NOTE — ED Notes (Signed)
Patient transported to CT 

## 2020-08-31 ENCOUNTER — Observation Stay (HOSPITAL_BASED_OUTPATIENT_CLINIC_OR_DEPARTMENT_OTHER): Payer: Medicare HMO

## 2020-08-31 DIAGNOSIS — I361 Nonrheumatic tricuspid (valve) insufficiency: Secondary | ICD-10-CM

## 2020-08-31 DIAGNOSIS — I340 Nonrheumatic mitral (valve) insufficiency: Secondary | ICD-10-CM

## 2020-08-31 DIAGNOSIS — R55 Syncope and collapse: Secondary | ICD-10-CM | POA: Diagnosis not present

## 2020-08-31 DIAGNOSIS — I6389 Other cerebral infarction: Secondary | ICD-10-CM | POA: Diagnosis not present

## 2020-08-31 LAB — GLUCOSE, CAPILLARY
Glucose-Capillary: 146 mg/dL — ABNORMAL HIGH (ref 70–99)
Glucose-Capillary: 150 mg/dL — ABNORMAL HIGH (ref 70–99)
Glucose-Capillary: 138 mg/dL — ABNORMAL HIGH (ref 70–99)
Glucose-Capillary: 161 mg/dL — ABNORMAL HIGH (ref 70–99)

## 2020-08-31 LAB — ECHOCARDIOGRAM COMPLETE
Area-P 1/2: 2.56 cm2
Height: 60 in
Radius: 0.3 cm
S' Lateral: 2.63 cm
Weight: 2709.01 oz

## 2020-08-31 LAB — CORTISOL-AM, BLOOD: Cortisol - AM: 14.2 ug/dL (ref 6.7–22.6)

## 2020-08-31 LAB — TSH: TSH: 2.905 u[IU]/mL (ref 0.350–4.500)

## 2020-08-31 NOTE — Progress Notes (Signed)
  Echocardiogram 2D Echocardiogram has been performed.  Augustine Radar 08/31/2020, 8:53 AM

## 2020-08-31 NOTE — Progress Notes (Signed)
PROGRESS NOTE    Jennifer Zhang  HCW:[id number] DOB: [birth year] DOA: 08/30/2020 PCP: [name], MD   Brief Narrative:  This is a 86 year old female with past medical history of diabetes, hypertension, hypothyroidism who presented to the ED after feeling lightheadedness falling when waking up this morning.  Patient fell back onto her bed when getting out of bed and was unable to get up. Had generalized weakness. She lives by herself at home and called EMS to help her get up. No slurred speech, no focal weakness otherwise, no visual changes, no headache no chest pain or shortness of breath, fever or chills or any other issues.  Had an episode like this in the past patient was admitted for presyncopal episode back in March had nausea and vomiting at that time that was thought to be related to hyperglycemia and dehydration. In ED: patient found to be afebrile, hemodynamically stable, on room air.  Labs overall unremarkable. Notable Imaging: CT head concerning for possible acute/subacute left cerebellar infarct with additional remote appearing bilateral cerebellar infarcts.   Assessment & Plan:   Principal Problem:   Near syncope Active Problems:   Type 2 diabetes mellitus without complication (HCC)   Hypothyroidism   Dizziness  Acute presyncopal episode likely orthostatic in nature, ongoing Patient remarks on poor p.o. intake in the past few weeks with diminished appetite but no clear complaints of nausea vomiting diarrhea constipation She does note chronic issues with "dizziness" when standing up too quickly We discussed possible need for reduction of home antihypertensives PT OT following, currently recommending SNF  Acute CVA ruled out Imaging including CT, MRI, carotid ultrasounds were negative for any acute findings or infarcts Remote strokes were noted on imaging but again nothing acute PT OT following given above per protocol -currently recommending SNF placement due to  ambulatory dysfunction  Hypothyroidism TSH within normal limits  Hypertension, essential Continue home medications, blood pressure currently well controlled  Insulin-dependent diabetes type 2, moderately well controlled Lab Results  Component Value Date   HGBA1C 7.5 (H) 08/30/2020    DVT prophylaxis: Lovenox Code Status: Full Family Communication: None present  Status is: Observation  Dispo: The patient is from: Home              Anticipated d/c is to: SNF              Anticipated d/c date is: 24 to 48 hours              Patient currently not medically stable for discharge given ongoing orthostatic hypotension and requirement for IV fluids, ultimate disposition to SNF likely next 24 to 48 hours pending clinical course  Consultants:   None  Procedures:   None  Antimicrobials:  None indicated  Subjective: No acute issues or events overnight, patient still has symptoms of dizziness lightheadedness worse when standing from seated position or sitting up too quickly in bed. She denies nausea, vomiting, diarrhea, constipation, headache, fevers, chills.  Objective: Vitals:   08/30/20 2049 08/31/20 0006 08/31/20 0405 08/31/20 0944  BP: 129/78 127/60 127/77 (!) 154/75  Pulse: 68 68 72   Resp: 14 14    Temp: 98.9 F (37.2 C) 99 F (37.2 C) 98.9 F (37.2 C)   TempSrc: Oral Oral Oral   SpO2: 95% 92% 96%   Weight:      Height:        Intake/Output Summary (Last 24 hours) at 08/31/2020 1256 Last data filed at 08/31/2020 1053 Gross per 24 hour  Intake 1965 ml  Output 1500 ml  Net 465 ml   Filed Weights   08/30/20 1613  Weight: 76.8 kg    Examination:  General exam: Appears calm and comfortable  Respiratory system: Clear to auscultation. Respiratory effort normal. Cardiovascular system: S1 & S2 heard, RRR. No JVD, murmurs, rubs, gallops or clicks. No pedal edema. Gastrointestinal system: Abdomen is nondistended, soft and nontender. No organomegaly or masses  felt. Normal bowel sounds heard. Central nervous system: Alert and oriented. No focal neurological deficits. Extremities: Symmetric 5 x 5 power. Skin: No rashes, lesions or ulcers Psychiatry: Judgement and insight appear normal. Mood & affect appropriate.     Data Reviewed: I have personally reviewed following labs and imaging studies  CBC: Recent Labs  Lab 08/30/20 1022  WBC 8.3  NEUTROABS 6.0  HGB 11.8*  HCT 36.7  MCV 91.8  PLT 195   Basic Metabolic Panel: Recent Labs  Lab 08/30/20 1022  NA 141  K 4.2  CL 104  CO2 27  GLUCOSE 144*  BUN 20  CREATININE 0.90  CALCIUM 9.4   GFR: Estimated Creatinine Clearance: 36.5 mL/min (by C-G formula based on SCr of 0.9 mg/dL). Liver Function Tests: Recent Labs  Lab 08/30/20 1022  AST 19  ALT 16  ALKPHOS 67  BILITOT 0.5  PROT 7.6  ALBUMIN 4.4   No results for input(s): LIPASE, AMYLASE in the last 168 hours. No results for input(s): AMMONIA in the last 168 hours. Coagulation Profile: Recent Labs  Lab 08/30/20 1022  INR 1.0   Cardiac Enzymes: No results for input(s): CKTOTAL, CKMB, CKMBINDEX, TROPONINI in the last 168 hours. BNP (last 3 results) No results for input(s): PROBNP in the last 8760 hours. HbA1C: Recent Labs    08/30/20 1022  HGBA1C 7.5*   CBG: Recent Labs  Lab 08/30/20 2140 08/31/20 0742  GLUCAP 119* 146*   Lipid Profile: Recent Labs    08/30/20 1623  CHOL 170  HDL 69  LDLCALC 93  TRIG 39  CHOLHDL 2.5   Thyroid Function Tests: Recent Labs    08/31/20 0631  TSH 2.905   Anemia Panel: No results for input(s): VITAMINB12, FOLATE, FERRITIN, TIBC, IRON, RETICCTPCT in the last 72 hours. Sepsis Labs: No results for input(s): PROCALCITON, LATICACIDVEN in the last 168 hours.  Recent Results (from the past 240 hour(s))  Resp Panel by RT-PCR (Flu A&B, Covid) Nasopharyngeal Swab     Status: None   Collection Time: 08/30/20 12:58 PM   Specimen: Nasopharyngeal Swab; Nasopharyngeal(NP) swabs  in vial transport medium  Result Value Ref Range Status   SARS Coronavirus 2 by RT PCR NEGATIVE NEGATIVE Final    Comment: (NOTE) SARS-CoV-2 target nucleic acids are NOT DETECTED.  The SARS-CoV-2 RNA is generally detectable in upper respiratory specimens during the acute phase of infection. The lowest concentration of SARS-CoV-2 viral copies this assay can detect is 138 copies/mL. A negative result does not preclude SARS-Cov-2 infection and should not be used as the sole basis for treatment or other patient management decisions. A negative result may occur with  improper specimen collection/handling, submission of specimen other than nasopharyngeal swab, presence of viral mutation(s) within the areas targeted by this assay, and inadequate number of viral copies(<138 copies/mL). A negative result must be combined with clinical observations, patient history, and epidemiological information. The expected result is Negative.  Fact Sheet for Patients:  BloggerCourse.comhttps://www.fda.gov/media/152166/download  Fact Sheet for Healthcare Providers:  SeriousBroker.ithttps://www.fda.gov/media/152162/download  This test is no t yet approved or cleared  by the [place] and  has been authorized for detection and/or diagnosis of SARS-CoV-2 by FDA under an Emergency Use Authorization (EUA). This EUA will remain  in effect (meaning this test can be used) for the duration of the COVID-19 declaration under Section 564(b)(1) of the Act, 21 U.S.C.section 360bbb-3(b)(1), unless the authorization is terminated  or revoked sooner.       Influenza A by PCR NEGATIVE NEGATIVE Final   Influenza B by PCR NEGATIVE NEGATIVE Final    Comment: (NOTE) The Xpert Xpress SARS-CoV-2/FLU/RSV plus assay is intended as an aid in the diagnosis of influenza from Nasopharyngeal swab specimens and should not be used as a sole basis for treatment. Nasal washings and aspirates are unacceptable for Xpert Xpress  SARS-CoV-2/FLU/RSV testing.  Fact Sheet for Patients: BloggerCourse.com  Fact Sheet for Healthcare Providers: SeriousBroker.it  This test is not yet approved or cleared by the [place] FDA and has been authorized for detection and/or diagnosis of SARS-CoV-2 by FDA under an Emergency Use Authorization (EUA). This EUA will remain in effect (meaning this test can be used) for the duration of the COVID-19 declaration under Section 564(b)(1) of the Act, 21 U.S.C. section 360bbb-3(b)(1), unless the authorization is terminated or revoked.  Performed at [facility], [address], [place], [place] [address]          Radiology Studies: CT HEAD WO CONTRAST  Result Date: 08/30/2020 CLINICAL DATA:  Focal neuro deficit, acute stroke suspected. Dizziness. EXAM: CT HEAD WITHOUT CONTRAST TECHNIQUE: Contiguous axial images were obtained from the base of the skull through the vertex without intravenous contrast. COMPARISON:  None. FINDINGS: Brain: No evidence of acute hemorrhage, hydrocephalus, extra-axial collection or mass lesion/mass effect. Age indeterminate left cerebellar hemisphere infarct (see series 2, image 8 and series 6, image 39), possibly acute/subacute. There are additional remote appearing bilateral cerebellar infarcts, largest on the right. Advanced confluent white matter hypoattenuation bilaterally, compatible with chronic microvascular ischemic disease. Mild diffuse cerebral volume loss with ex vacuo ventricular dilation. No acute hemorrhage. No hydrocephalus. No mass lesion or abnormal mass effect. Vascular: Calcific atherosclerosis. No definite hyperdense vessel identified. Skull: No acute fracture. Left frontal outer calvarial table hyperostosis. Sinuses/Orbits: Visualized sinuses are clear.  Unremarkable orbits. Other: No mastoid effusions. IMPRESSION: 1. Age indeterminate, possibly acute/subacute left  cerebellar infarct. Recommend MRI to further evaluate. 2. There are additional remote appearing bilateral cerebellar infarcts. 3. Advanced confluent chronic microvascular ischemic disease. Electronically Signed   By: Feliberto Harts MD   On: 08/30/2020 10:45   MR BRAIN WO CONTRAST  Result Date: 08/30/2020 CLINICAL DATA:  Dizziness EXAM: MRI HEAD WITHOUT CONTRAST TECHNIQUE: Multiplanar, multiecho pulse sequences of the brain and surrounding structures were obtained without intravenous contrast. COMPARISON:  None. FINDINGS: Brain: There is no acute infarction or intracranial hemorrhage. Prominence of the ventricles and sulci reflects generalized parenchymal volume loss. Confluent areas of T2 hyperintensity in the supratentorial and pontine white matter nonspecific but probably reflect advanced chronic microvascular ischemic changes. There are chronic small vessel infarcts of the central gray nuclei and bilateral cerebellum. Punctate focus of susceptibility in the left corona radiata likely reflects a chronic microhemorrhage. There is no intracranial mass, mass effect, or edema. There is no hydrocephalus or extra-axial fluid collection. Vascular: Major vessel flow voids at the skull base are preserved. Skull and upper cervical spine: Marrow signal is overall preserved. There are advanced degenerative changes at C3-C4. Sinuses/Orbits: Paranasal sinuses are aerated. Orbits are unremarkable. Other: Sella is unremarkable.  Mastoid air cells are clear. IMPRESSION: No evidence of recent infarction, hemorrhage, or mass. Advanced chronic microvascular ischemic changes. Chronic small vessel infarcts. Electronically Signed   By: Guadlupe Spanish M.D.   On: 08/30/2020 14:19   VAS US CAROTID  Result Date: 08/30/2020 Carotid Arterial Duplex Study Indications:       TIA. Risk Factors:      None. Comparison Study:  No prior studies. Performing Technologist: Chanda Busing RVT  Examination Guidelines: A complete evaluation  includes B-mode imaging, spectral Doppler, color Doppler, and power Doppler as needed of all accessible portions of each vessel. Bilateral testing is considered an integral part of a complete examination. Limited examinations for reoccurring indications may be performed as noted.  Right Carotid Findings: +----------+--------+--------+--------+-----------------------+--------+           PSV cm/sEDV cm/sStenosisPlaque Description     Comments +----------+--------+--------+--------+-----------------------+--------+ CCA Prox  73      12              smooth and heterogenous         +----------+--------+--------+--------+-----------------------+--------+ CCA Distal43      10              smooth and heterogenous         +----------+--------+--------+--------+-----------------------+--------+ ICA Prox  52      8               calcific                        +----------+--------+--------+--------+-----------------------+--------+ ICA Distal65      20                                     tortuous +----------+--------+--------+--------+-----------------------+--------+ ECA       60      4                                               +----------+--------+--------+--------+-----------------------+--------+ +----------+--------+-------+--------+-------------------+           PSV cm/sEDV cmsDescribeArm Pressure (mmHG) +----------+--------+-------+--------+-------------------+ [id number]                                        +----------+--------+-------+--------+-------------------+ +---------+--------+--+--------+--+---------+ VertebralPSV cm/s48EDV cm/s10Antegrade +---------+--------+--+--------+--+---------+  Left Carotid Findings: +----------+--------+--------+--------+-----------------------+--------+           PSV cm/sEDV cm/sStenosisPlaque Description     Comments +----------+--------+--------+--------+-----------------------+--------+ CCA Prox  117      17              smooth and heterogenoustortuous +----------+--------+--------+--------+-----------------------+--------+ CCA Distal35      9               smooth and heterogenous         +----------+--------+--------+--------+-----------------------+--------+ ICA Prox  40      12              smooth and heterogenoustortuous +----------+--------+--------+--------+-----------------------+--------+ ICA Distal34      10                                     tortuous +----------+--------+--------+--------+-----------------------+--------+ ECA  51      4                                               +----------+--------+--------+--------+-----------------------+--------+ +----------+--------+--------+--------+-------------------+           PSV cm/sEDV cm/sDescribeArm Pressure (mmHG) +----------+--------+--------+--------+-------------------+ [id number]                                         +----------+--------+--------+--------+-------------------+ +---------+--------+--+--------+-+---------+ VertebralPSV cm/s40EDV cm/s9Antegrade +---------+--------+--+--------+-+---------+   Summary: Right Carotid: Velocities in the right ICA are consistent with a 1-39% stenosis. Left Carotid: Velocities in the left ICA are consistent with a 1-39% stenosis. Vertebrals: Bilateral vertebral arteries demonstrate antegrade flow. *See table(s) above for measurements and observations.  Electronically signed by Fabienne Bruns MD on 08/30/2020 at 3:54:16 PM.    Final         Scheduled Meds: . atorvastatin  40 mg Oral QHS  . enoxaparin (LOVENOX) injection  40 mg Subcutaneous Q24H  . lisinopril  20 mg Oral Daily   And  . hydrochlorothiazide  25 mg Oral Daily  . insulin aspart  0-9 Units Subcutaneous TID WC  . insulin glargine  10 Units Subcutaneous QHS  . isosorbide mononitrate  10 mg Oral BID  . levothyroxine  50 mcg Oral QAC breakfast  . verapamil  240 mg Oral Daily    Continuous Infusions: . sodium chloride 1,000 mL (08/31/20 0853)     LOS: 0 days    Time spent:    Azucena Fallen, DO [facility]  If 7PM-7AM, please contact night-coverage [email or web address]  08/31/2020, 12:56 PM

## 2020-08-31 NOTE — Evaluation (Addendum)
Occupational Therapy Evaluation Patient Details Name: Jennifer Zhang MRN: [id number] DOB: September 04, 1926 Today's Date: 08/31/2020    History of Present Illness 86 y.o. female presenting with generalized weakness, fall and near syncope with concern for TIA vs. orthostatic hypotension. MRI (-) for acute findings but shows chronic cerebellar strokes. PMHx significant for Dm, HTN, and hypothyroidism.    Clinical Impression   PTA patient was living alone in a 3rd floor senior living apartment with elevator access and was requiring assist at baseline for ADLs/IADLs including sponge bathing/dressing. Patient reports that her daughter would drop by 1x daily to assist as needed. Patient was ambulating in home and community dwellings with use of rollator and reports ability to complete toileting tasks with use of BSC and Mod I. Patient currently presents with deficits including generalized weakness, decreased cognition, decreased safety awareness, and increased need for external assist. Patient would benefit from continued acute OT services to maximize safety and independence with self-care tasks in prep for safe d/c to next level of care with recommendation for SNF given patient's CLOF and only PRN supervision/assist.     Follow Up Recommendations  SNF    Equipment Recommendations  None recommended by OT    Recommendations for Other Services       Precautions / Restrictions Precautions Precautions: Fall Precaution Comments: High Fall Risk, HOH Restrictions Weight Bearing Restrictions: No      Mobility Bed Mobility Overal bed mobility: Needs Assistance Bed Mobility: Supine to Sit     Supine to sit: Supervision;HOB elevated     General bed mobility comments: Increased time/effort.     Transfers Overall transfer level: Needs assistance Equipment used: Rolling walker (2 wheeled) Transfers: Sit to/from [employer] Sit to Stand: Min assist;From elevated surface Stand  pivot transfers: Min assist       General transfer comment: Min A for sit to stand from EOB x2 trials and Min A for stand-pivot transfer to recliner with use of RW.     Balance Overall balance assessment: Needs assistance Sitting-balance support: Bilateral upper extremity supported;Feet supported Sitting balance-Leahy Scale: Fair     Standing balance support: Bilateral upper extremity supported Standing balance-Leahy Scale: Poor Standing balance comment: Heavy reliance on BUE on RW and external assist.                            ADL either performed or assessed with clinical judgement   ADL Overall ADL's : Needs assistance/impaired                 Upper Body Dressing : Minimal assistance;Sitting Upper Body Dressing Details (indicate cue type and reason): Min A to don posterior hospital gown seated EOB.  Lower Body Dressing: Maximal assistance Lower Body Dressing Details (indicate cue type and reason): Mod A to don footwear seated EOB. Patient would likely require assist to thread BLE and hike pants over hips in standing.  Toilet Transfer: Immunologist Details (indicate cue type and reason): Simulated with transfer to recliner with use of RW.          Functional mobility during ADLs: Min guard;Minimal assistance;Rolling walker General ADL Comments: Min A with shuffle like gait initially, progressing to Min guard.      Vision Baseline Vision/History: Wears glasses Wears Glasses: Reading only Patient Visual Report: No change from baseline Vision Assessment?: No apparent visual deficits     Perception     Praxis  Pertinent Vitals/Pain Pain Assessment: 0-10 Pain Score: 6  Pain Location: Bilatral hands and R shoulder 2/2 OA Pain Descriptors / Indicators: Aching;Numbness Pain Intervention(s): Monitored during session     Hand Dominance Right   Extremity/Trunk Assessment Upper Extremity Assessment Upper Extremity Assessment:  Generalized weakness;RUE deficits/detail RUE Deficits / Details: R shoulder pain 2/2 OA. Inability to flexion/abduct shoulder >90 degrees.  RUE Sensation: history of peripheral neuropathy RUE Coordination: decreased fine motor;decreased gross motor   Lower Extremity Assessment Lower Extremity Assessment: Defer to PT evaluation   Cervical / Trunk Assessment Cervical / Trunk Assessment: Kyphotic   Communication Communication Communication: No difficulties   Cognition Arousal/Alertness: Awake/alert Behavior During Therapy: WFL for tasks assessed/performed Overall Cognitive Status: No family/caregiver present to determine baseline cognitive functioning                                 General Comments: Patient oriented to person, place and situation but not time. Patient able to recall 2 items from [facility] with cues only. Unable to recall 3rd item indicating decreased short-term memory.     General Comments  Patient c/o dizziness upon transition from supine to EOB. BP monitored with transition from sitting to standing with no changes noted.     Exercises     Shoulder Instructions      Home Living Family/patient expects to be discharged to:: Private residence Living Arrangements: Alone (Senior Living Apartment) Available Help at Discharge: Family;Available 24 hours/day Type of Home: Apartment Home Access: Elevator (3rd floor apartment)     Home Layout: One level     Bathroom Shower/Tub: Chief Strategy Officer: Standard     Home Equipment: Environmental consultant - 4 wheels;Bedside commode;Tub bench          Prior Functioning/Environment Level of Independence: Needs assistance  Gait / Transfers Assistance Needed: Use of rollator in home and community dwellings. Patient was ambulating to [facility] at bedside with Mod I.   ADL's / Homemaking Assistance Needed: Assist with sponge bathing and dressing. Patient reports completing toileting/hygiene/clothing management with Mod I  and increased time/effort. Patient also reports completing small meal prep with Mod I. Patient receives meals on wheels. Daughter assist with med management but patient takes meds from pill organzier unassisted.             OT Problem List: Decreased strength;Decreased range of motion;Decreased activity tolerance;Impaired balance (sitting and/or standing);Decreased coordination;Decreased cognition;Decreased safety awareness;Decreased knowledge of use of DME or AE;Impaired sensation;Impaired UE functional use;Increased edema;Pain      OT Treatment/Interventions: Self-care/ADL training;Therapeutic exercise;Energy conservation;DME and/or AE instruction;Therapeutic activities;Patient/family education;Balance training    OT Goals(Current goals can be found in the care plan section) Acute Rehab OT Goals Patient Stated Goal: To get stronger OT Goal Formulation: With patient Time For Goal Achievement: 09/14/20 Potential to Achieve Goals: Good ADL Goals Pt Will Perform Grooming: with set-up;sitting Pt Will Perform Upper Body Dressing: with set-up;sitting Pt Will Perform Lower Body Dressing: with min assist;sit to/from stand;with adaptive equipment Pt Will Transfer to Toilet: with supervision;ambulating;bedside commode Pt Will Perform Toileting - Clothing Manipulation and hygiene: with supervision;sit to/from stand;sitting/lateral leans Additional ADL Goal #1: Patient will demonstrate ability for day to day recall/carry over during ADLs with supervision A.  OT Frequency: Min 2X/week   Barriers to D/C: Decreased caregiver support          Co-evaluation  AM-PAC OT "6 Clicks" Daily Activity     Outcome Measure Help from another person eating meals?: A Little Help from another person taking care of personal grooming?: A Little Help from another person toileting, which includes using toliet, bedpan, or urinal?: A Little Help from another person bathing (including washing,  rinsing, drying)?: A Lot Help from another person to put on and taking off regular upper body clothing?: A Little Help from another person to put on and taking off regular lower body clothing?: A Lot 6 Click Score: 16   End of Session Equipment Utilized During Treatment: Gait belt;Rolling walker Nurse Communication: Mobility status  Activity Tolerance: Patient tolerated treatment well Patient left: in chair;with call bell/phone within reach;with chair alarm set  OT Visit Diagnosis: Unsteadiness on feet (R26.81);Muscle weakness (generalized) (M62.81);History of falling (Z91.81);Dizziness and giddiness (R42);Pain Pain - Right/Left: Right Pain - part of body: Shoulder                Time: 3903-0092 OT Time Calculation (min): 34 min Charges:  OT General Charges $OT Visit: 1 Visit OT Evaluation $OT Eval Moderate Complexity: 1 Mod OT Treatments $Self Care/Home Management : 8-22 mins  Inocencio Roy H. OTR/L Supplemental OT, Department of rehab services [phone]  Donella Pascarella R H. 08/31/2020, 10:43 AM

## 2020-08-31 NOTE — Evaluation (Signed)
Physical Therapy Evaluation Patient Details Name: Jennifer Zhang MRN: [id number] DOB: July 01, 1926 Today's Date: 08/31/2020   History of Present Illness  86 y.o. female presenting with generalized weakness, fall and near syncope with concern for TIA vs. orthostatic hypotension. MRI (-) for acute findings but shows chronic cerebellar strokes. PMHx significant for Dm, HTN, and hypothyroidism.     Clinical Impression  Jennifer Zhang is 86 y.o. female admitted with above HPI and diagnosis. Patient is currently limited by functional impairments below (see PT problem list). Patient lives alone at a senior living apartment and is Modified Independent with Rollator for mobility at baseline and has intermittent assist from her daughter for bathing and ADLs. Patient will benefit from continued skilled PT interventions to address impairments and progress independence with mobility, recommending SNF level follow up for ST rehab vs return to apartment with 24/7 assist and [facility] service. Acute PT will follow and progress as able.     Follow Up Recommendations Home health PT;Supervision/Assistance - 24 hour;SNF (HHPT with home aid for 24/7 assist initially vs SNF)    Equipment Recommendations  Other (comment) (Rollator (pt's is very old and is bad condition))    Recommendations for Other Services       Precautions / Restrictions Precautions Precautions: Fall Precaution Comments: High Fall Risk, HOH Restrictions Weight Bearing Restrictions: No      Mobility  Bed Mobility Overal bed mobility: Needs Assistance Bed Mobility: Supine to Sit;Sit to Supine     Supine to sit: Supervision;HOB elevated Sit to supine: Supervision   General bed mobility comments: Increased time/effort. pt using bed rail to raise trunk and scoot to EOB. pt taking extra time to return to supine for assist bathing.    Transfers Overall transfer level: Needs assistance Equipment used: Rolling walker (2 wheeled) Transfers:  Sit to/from [employer] Sit to Stand: Min assist;Mod assist Stand pivot transfers: Min assist;Mod assist       General transfer comment: Min assist from EOB, pt using bil UE's for power up. Mod assist for power up from rollator seat with cues required for safe hand position. Min-mod assist to maintain balance with pivot to turn and sit on rollator or turn and sit on recline and EOB.  Ambulation/Gait Ambulation/Gait assistance: Min assist Gait Distance (Feet): 220 Feet Assistive device: 4-wheeled walker Gait Pattern/deviations: Step-through pattern;Decreased stride length;Shuffle;Trunk flexed Gait velocity: decr   General Gait Details: pt with tendency to keep UE's extended and hold rollator far ahead. VC's required to improve proximity to RW. Cues needed for safe use of brakes when using seat of rollator. pt took seated rest halfwy through gait. min assist needed to steady and prevent LOB intermittently as well as cue to avoid obstacles in hallway. HR max of 121 bpm with gait.  Stairs            Wheelchair Mobility    Modified Rankin (Stroke Patients Only)       Balance Overall balance assessment: Needs assistance Sitting-balance support: Bilateral upper extremity supported;Feet supported Sitting balance-Leahy Scale: Fair     Standing balance support: Bilateral upper extremity supported Standing balance-Leahy Scale: Poor Standing balance comment: Heavy reliance on BUE on RW and external assist.                              Pertinent Vitals/Pain Pain Assessment: 0-10 Pain Score: 4  Pain Location: legs feel weak Pain Descriptors / Indicators: Sore (weak)  Pain Intervention(s): Limited activity within patient's tolerance;Monitored during session    Home Living Family/patient expects to be discharged to:: Private residence Living Arrangements: Alone (Senior Living Apartment) Available Help at Discharge: Family;Available  PRN/intermittently Type of Home: Apartment Home Access: Elevator (3rd floor apartment)     Home Layout: One level Home Equipment: Walker - 4 wheels;Bedside commode;Tub bench      Prior Function Level of Independence: Needs assistance   Gait / Transfers Assistance Needed: Use of rollator in home and community dwellings. Patient was ambulating to [facility] at bedside with Mod I.    ADL's / Homemaking Assistance Needed: Pt is Mod I for toileting and hygiene as well as ADL's for dressing and cooking instant meals. Patient gets Meals on Wheels. Patient mobilizes at Mod I level wtih Rollator. Daughter helps intermittently to assist with bathing.        Hand Dominance   Dominant Hand: Right    Extremity/Trunk Assessment   Upper Extremity Assessment Upper Extremity Assessment: Defer to OT evaluation    Lower Extremity Assessment Lower Extremity Assessment: Generalized weakness;RLE deficits/detail;LLE deficits/detail RLE Deficits / Details: 3+/5 for hip flexion and dorsiflexion, 3/5 for plantar felxion RLE Sensation: WNL RLE Coordination: WNL LLE Deficits / Details: 3/5 for hip flexion and dorsiflexion, 3/5 for plantar felxion LLE Sensation: WNL LLE Coordination: WNL    Cervical / Trunk Assessment Cervical / Trunk Assessment: Kyphotic  Communication   Communication: No difficulties  Cognition Arousal/Alertness: Awake/alert Behavior During Therapy: WFL for tasks assessed/performed Overall Cognitive Status: No family/caregiver present to determine baseline cognitive functioning                                 General Comments: Patient oriented to person, place and situation but not time. Patient able to recall 2 items from [facility] [place] with cues only. Unable to recall 3rd item indicating decreased short-term memory.        General Comments General comments (skin integrity, edema, etc.): Pt denied dizziness with mobility. BP assessed and pt Hypertensive throughout with no  significant changes or orthostatic hypotension.     Exercises     Assessment/Plan    PT Assessment Patient needs continued PT services  PT Problem List Decreased strength;Decreased range of motion;Decreased activity tolerance;Decreased balance;Decreased mobility;Decreased knowledge of use of DME;Decreased knowledge of precautions       PT Treatment Interventions DME instruction;Gait training;Stair training;Functional mobility training;Therapeutic activities;Therapeutic exercise;Balance training;Patient/family education    PT Goals (Current goals can be found in the Care Plan section)  Acute Rehab PT Goals Patient Stated Goal: To get stronger PT Goal Formulation: With patient Time For Goal Achievement: 09/14/20 Potential to Achieve Goals: Good    Frequency Min 3X/week   Barriers to discharge        Co-evaluation               AM-PAC PT "6 Clicks" Mobility  Outcome Measure Help needed turning from your back to your side while in a flat bed without using bedrails?: None Help needed moving from lying on your back to sitting on the side of a flat bed without using bedrails?: None Help needed moving to and from a bed to a chair (including a wheelchair)?: A Little Help needed standing up from a chair using your arms (e.g., wheelchair or bedside chair)?: A Little Help needed to walk in hospital room?: A Little Help needed climbing 3-5 steps with a railing? : A  Little 6 Click Score: 20    End of Session Equipment Utilized During Treatment: Gait belt Activity Tolerance: Patient tolerated treatment well Patient left: in bed;with call bell/phone within reach;with chair alarm set;with family/visitor present;with nursing/sitter in room Nurse Communication: Mobility status PT Visit Diagnosis: Muscle weakness (generalized) (M62.81);Difficulty in walking, not elsewhere classified (R26.2);Other abnormalities of gait and mobility (R26.89)    Time: 4098-1191 PT Time Calculation  (min) (ACUTE ONLY): 31 min   Charges:   PT Evaluation $PT Eval Low Complexity: 1 Low PT Treatments $Gait Training: 8-22 mins        Wynn Maudlin, DPT Acute Rehabilitation Services  Office [phone] Pager [phone]  08/31/2020 1:38 PM

## 2020-08-31 NOTE — Evaluation (Signed)
Speech Language Pathology Evaluation Patient Details Name: Jennifer Zhang MRN: [id number] DOB: July 18, 1926 Today's Date: 08/31/2020 Time: 2542-7062 SLP Time Calculation (min) (ACUTE ONLY): 20 min  Problem List:  Patient Active Problem List   Diagnosis Date Noted  . Dizziness 08/30/2020  . Hav (hallux abducto valgus), unspecified laterality 03/30/2020  . Pes planus 03/30/2020  . Near syncope 12/22/2019  . Type 2 diabetes mellitus without complication (HCC) 12/21/2019  . HTN (hypertension) 12/21/2019  . Hypothyroidism 12/21/2019  . Chronic swimmer's ear of both sides 12/05/2018  . Impacted cerumen of both ears 12/05/2018  . Sensorineural hearing loss (SNHL), bilateral 12/05/2018   Past Medical History:  Past Medical History:  Diagnosis Date  . Diabetes mellitus without complication (HCC)   . Hypertension   . Thyroid disease    Past Surgical History:  Past Surgical History:  Procedure Laterality Date  . ABDOMINAL HYSTERECTOMY    . FOOT SURGERY Right 1960   HPI:  86 y.o. female presenting with generalized weakness, fall and near syncope with concern for TIA vs. orthostatic hypotension. MRI (-) for acute findings but shows chronic cerebellar strokes. PMHx significant for Dm, HTN, and hypothyroidism.   Assessment / Plan / Recommendation Clinical Impression  Patient presents with a mild cognitive impairment however she is likely very near her baseline. MRI did not reveal any acute infarct but did show remote small vessel infarcts bilaterally in cerebellar region. Patient exhibited a mild memory impairment for recent events and retrieval of short term memories. She does not demonstrate adequate insight into her deficits and impact on her function and safety, however she is not without some awareness to deficits in general. Daughter stated that prior to this hospitalization, she was working on getting patient an aide to come help with ADL's. Patient may benefit from SLP evaluation and  treatment as needed for cognitive function at next venue of care.    SLP Assessment  SLP Recommendation/Assessment: All further Speech Lanaguage Pathology  needs can be addressed in the next venue of care SLP Visit Diagnosis: Cognitive communication deficit (R41.841)    Follow Up Recommendations  24 hour supervision/assistance;Skilled Nursing facility    Frequency and Duration   N/A        SLP Evaluation Cognition  Overall Cognitive Status: Impaired/Different from baseline Orientation Level: Oriented to person;Oriented to place;Oriented to situation;Disoriented to time Attention: Sustained Sustained Attention: Appears intact Memory: Impaired Memory Impairment: Retrieval deficit Awareness: Impaired Awareness Impairment: Emergent impairment Problem Solving: Appears intact Safety/Judgment: Impaired       Comprehension  Auditory Comprehension Overall Auditory Comprehension: Appears within functional limits for tasks assessed Conversation: Simple    Expression Expression Primary Mode of Expression: Verbal Verbal Expression Overall Verbal Expression: Appears within functional limits for tasks assessed   Oral / Motor  Oral Motor/Sensory Function Overall Oral Motor/Sensory Function: Within functional limits Motor Speech Overall Motor Speech: Appears within functional limits for tasks assessed Respiration: Within functional limits Resonance: Within functional limits Articulation: Within functional limitis Intelligibility: Intelligible   GO                    Angela Nevin, MA, CCC-SLP Speech Therapy

## 2020-09-01 DIAGNOSIS — Z794 Long term (current) use of insulin: Secondary | ICD-10-CM | POA: Diagnosis not present

## 2020-09-01 DIAGNOSIS — Z9181 History of falling: Secondary | ICD-10-CM | POA: Diagnosis not present

## 2020-09-01 DIAGNOSIS — E669 Obesity, unspecified: Secondary | ICD-10-CM | POA: Diagnosis present

## 2020-09-01 DIAGNOSIS — Z20822 Contact with and (suspected) exposure to covid-19: Secondary | ICD-10-CM | POA: Diagnosis present

## 2020-09-01 DIAGNOSIS — Z8673 Personal history of transient ischemic attack (TIA), and cerebral infarction without residual deficits: Secondary | ICD-10-CM | POA: Diagnosis not present

## 2020-09-01 DIAGNOSIS — Z91048 Other nonmedicinal substance allergy status: Secondary | ICD-10-CM | POA: Diagnosis not present

## 2020-09-01 DIAGNOSIS — Z79899 Other long term (current) drug therapy: Secondary | ICD-10-CM | POA: Diagnosis not present

## 2020-09-01 DIAGNOSIS — Z87891 Personal history of nicotine dependence: Secondary | ICD-10-CM | POA: Diagnosis not present

## 2020-09-01 DIAGNOSIS — E876 Hypokalemia: Secondary | ICD-10-CM | POA: Diagnosis present

## 2020-09-01 DIAGNOSIS — D649 Anemia, unspecified: Secondary | ICD-10-CM | POA: Diagnosis present

## 2020-09-01 DIAGNOSIS — W19XXXA Unspecified fall, initial encounter: Secondary | ICD-10-CM | POA: Diagnosis present

## 2020-09-01 DIAGNOSIS — R42 Dizziness and giddiness: Secondary | ICD-10-CM | POA: Diagnosis present

## 2020-09-01 DIAGNOSIS — E039 Hypothyroidism, unspecified: Secondary | ICD-10-CM | POA: Diagnosis present

## 2020-09-01 DIAGNOSIS — H903 Sensorineural hearing loss, bilateral: Secondary | ICD-10-CM | POA: Diagnosis present

## 2020-09-01 DIAGNOSIS — E119 Type 2 diabetes mellitus without complications: Secondary | ICD-10-CM | POA: Diagnosis present

## 2020-09-01 DIAGNOSIS — Z713 Dietary counseling and surveillance: Secondary | ICD-10-CM | POA: Diagnosis not present

## 2020-09-01 DIAGNOSIS — Z833 Family history of diabetes mellitus: Secondary | ICD-10-CM | POA: Diagnosis not present

## 2020-09-01 DIAGNOSIS — E785 Hyperlipidemia, unspecified: Secondary | ICD-10-CM | POA: Diagnosis present

## 2020-09-01 DIAGNOSIS — I951 Orthostatic hypotension: Secondary | ICD-10-CM | POA: Diagnosis present

## 2020-09-01 DIAGNOSIS — Z6833 Body mass index (BMI) 33.0-33.9, adult: Secondary | ICD-10-CM | POA: Diagnosis not present

## 2020-09-01 DIAGNOSIS — I10 Essential (primary) hypertension: Secondary | ICD-10-CM | POA: Diagnosis present

## 2020-09-01 DIAGNOSIS — Z90710 Acquired absence of both cervix and uterus: Secondary | ICD-10-CM | POA: Diagnosis not present

## 2020-09-01 DIAGNOSIS — Z886 Allergy status to analgesic agent status: Secondary | ICD-10-CM | POA: Diagnosis not present

## 2020-09-01 DIAGNOSIS — Z79890 Hormone replacement therapy (postmenopausal): Secondary | ICD-10-CM | POA: Diagnosis not present

## 2020-09-01 DIAGNOSIS — R55 Syncope and collapse: Secondary | ICD-10-CM | POA: Diagnosis not present

## 2020-09-01 LAB — BASIC METABOLIC PANEL
Anion gap: 9 (ref 5–15)
BUN: 14 mg/dL (ref 8–23)
CO2: 24 mmol/L (ref 22–32)
Calcium: 8.6 mg/dL — ABNORMAL LOW (ref 8.9–10.3)
Chloride: 105 mmol/L (ref 98–111)
Creatinine, Ser: 0.7 mg/dL (ref 0.44–1.00)
GFR, Estimated: >60 mL/min (ref >60)
Glucose, Bld: 120 mg/dL — ABNORMAL HIGH (ref 70–99)
Potassium: 3.5 mmol/L (ref 3.5–5.1)
Sodium: 138 mmol/L (ref 135–145)

## 2020-09-01 LAB — CBC
HCT: 32.9 % — ABNORMAL LOW (ref 36.0–46.0)
Hemoglobin: 10.6 g/dL — ABNORMAL LOW (ref 12.0–15.0)
MCH: 29.4 pg (ref 26.0–34.0)
MCHC: 32.2 g/dL (ref 30.0–36.0)
MCV: 91.1 fL (ref 80.0–100.0)
Platelets: 181 10*3/uL (ref 150–400)
RBC: 3.61 MIL/uL — ABNORMAL LOW (ref 3.87–5.11)
RDW: 15.7 % — ABNORMAL HIGH (ref 11.5–15.5)
WBC: 7.1 10*3/uL (ref 4.0–10.5)
nRBC: 0 % (ref 0.0–0.2)

## 2020-09-01 LAB — GLUCOSE, CAPILLARY
Glucose-Capillary: 128 mg/dL — ABNORMAL HIGH (ref 70–99)
Glucose-Capillary: 175 mg/dL — ABNORMAL HIGH (ref 70–99)
Glucose-Capillary: 91 mg/dL (ref 70–99)
Glucose-Capillary: 99 mg/dL (ref 70–99)

## 2020-09-01 MED ORDER — SODIUM CHLORIDE 0.9 % IV SOLN
1000.0000 mL | INTRAVENOUS | Status: DC
  Administered 2020-09-01 – 2020-09-02 (×2): 1000 mL via INTRAVENOUS

## 2020-09-01 NOTE — [place] FL2 (Signed)
[place] MEDICAID FL2 LEVEL OF CARE SCREENING TOOL     IDENTIFICATION  Patient Name: Jennifer Zhang Birthdate: 1925/10/14 Sex: female Admission Date (Current Location): 08/30/2020  [facility] and [place] Number:  Producer, television/film/video and Address:  [facility],  [address] [place], [place] [address]      Provider Number: [id number]  Attending Physician Name and Address:  Merlene Laughter, DO  Relative Name and Phone Number:       Current Level of Care: Hospital Recommended Level of Care: Skilled Nursing Facility Prior Approval Number:    Date Approved/Denied:   PASRR Number:    Discharge Plan: SNF    Current Diagnoses: Patient Active Problem List   Diagnosis Date Noted  . Dizziness 08/30/2020  . Hav (hallux abducto valgus), unspecified laterality 03/30/2020  . Pes planus 03/30/2020  . Near syncope 12/22/2019  . Type 2 diabetes mellitus without complication (HCC) 12/21/2019  . HTN (hypertension) 12/21/2019  . Hypothyroidism 12/21/2019  . Chronic swimmer's ear of both sides 12/05/2018  . Impacted cerumen of both ears 12/05/2018  . Sensorineural hearing loss (SNHL), bilateral 12/05/2018    Orientation RESPIRATION BLADDER Height & Weight     Self, Time, Situation, Place  Normal Incontinent Weight: 76.8 kg Height:  5' (152.4 cm)  BEHAVIORAL SYMPTOMS/MOOD NEUROLOGICAL BOWEL NUTRITION STATUS      Continent Diet (Heart healthy/ carb modified)  AMBULATORY STATUS COMMUNICATION OF NEEDS Skin   Limited Assist Verbally Normal                       Personal Care Assistance Level of Assistance  Bathing, Dressing Bathing Assistance: Limited assistance   Dressing Assistance: Limited assistance     Functional Limitations Info  Sight, Hearing Sight Info: Adequate Hearing Info: Adequate      SPECIAL CARE FACTORS FREQUENCY  OT (By licensed OT), PT (By licensed PT)     PT Frequency: 5 x weekly OT Frequency: 5 x weekly            Contractures  Contractures Info: Not present    Additional Factors Info  Code Status, Allergies Code Status Info: Full Allergies Info: Tape, Aspirin           Current Medications (09/01/2020):  This is the current hospital active medication list Current Facility-Administered Medications  Medication Dose Route Frequency Provider Last Rate Last Admin  . 0.9 %  sodium chloride infusion  1,000 mL Intravenous Continuous Jae Dire, MD 125 mL/hr at 09/01/20 0247 1,000 mL at 09/01/20 0247  . acetaminophen (TYLENOL) tablet 650 mg  650 mg Oral Q4H PRN Jae Dire, MD   650 mg at 09/01/20 0736   Or  . acetaminophen (TYLENOL) 160 MG/5ML solution 650 mg  650 mg Per Tube Q4H PRN Jae Dire, MD       Or  . acetaminophen (TYLENOL) suppository 650 mg  650 mg Rectal Q4H PRN Jae Dire, MD      . atorvastatin (LIPITOR) tablet 40 mg  40 mg Oral QHS Jae Dire, MD   40 mg at 08/31/20 2207  . enoxaparin (LOVENOX) injection 40 mg  40 mg Subcutaneous Q24H Jae Dire, MD   40 mg at 08/31/20 2207  . lisinopril (ZESTRIL) tablet 20 mg  20 mg Oral Daily Jae Dire, MD   20 mg at 09/01/20 5093   And  . hydrochlorothiazide (HYDRODIURIL) tablet 25 mg  25 mg Oral Daily Whitney Post  E, MD   25 mg at 09/01/20 0946  . insulin aspart (novoLOG) injection 0-9 Units  0-9 Units Subcutaneous TID WC Jae Dire, MD   1 Units at 09/01/20 [phone]  . insulin glargine (LANTUS) injection 10 Units  10 Units Subcutaneous QHS Jae Dire, MD   10 Units at 08/31/20 2208  . isosorbide mononitrate (ISMO) tablet 10 mg  10 mg Oral BID Jae Dire, MD   10 mg at 09/01/20 0946  . levothyroxine (SYNTHROID) tablet 50 mcg  50 mcg Oral QAC breakfast Jae Dire, MD   50 mcg at 09/01/20 [phone]  . nitroGLYCERIN (NITROSTAT) SL tablet 0.4 mg  0.4 mg Sublingual Q5 min PRN Jae Dire, MD      . verapamil (CALAN-SR) CR tablet 240 mg  240 mg Oral Daily Jae Dire, MD   240 mg at 09/01/20 4287     Discharge  Medications: Please see discharge summary for a list of discharge medications.  Relevant Imaging Results:  Relevant Lab Results:   Additional Information ss# [id number]  Aedan Geimer, Meriam Sprague, RN

## 2020-09-01 NOTE — Progress Notes (Signed)
Physical Therapy Treatment Patient Details Name: Jennifer Zhang MRN: [id number] DOB: 1925/12/10 Today's Date: 09/01/2020    History of Present Illness 86 y.o. female presenting with generalized weakness, fall and near syncope with concern for TIA vs. orthostatic hypotension. MRI (-) for acute findings but shows chronic cerebellar strokes. PMHx significant for Dm, HTN, and hypothyroidism.     PT Comments    Pt eager to mobilize and assisted with ambulating in hallway.  Pt requiring min assist for mobility and cues for safe use of rollator.  Recommend SNF upon d/c.   Follow Up Recommendations  SNF     Equipment Recommendations  Other (comment) (rollator if home)    Recommendations for Other Services       Precautions / Restrictions Precautions Precautions: Fall Precaution Comments: High Fall Risk, HOH    Mobility  Bed Mobility Overal bed mobility: Needs Assistance Bed Mobility: Supine to Sit     Supine to sit: Supervision;HOB elevated Sit to supine: Supervision   General bed mobility comments: Increased time/effort. pt using bed rail to raise trunk and scoot to EOB. pt taking extra time to return to supine  Transfers Overall transfer level: Needs assistance Equipment used: 4-wheeled walker Transfers: Sit to/from Stand Sit to Stand: Min assist         General transfer comment: assist to steady, cues for hand placement as pt tends to pull up on rollator  Ambulation/Gait Ambulation/Gait assistance: Min assist Gait Distance (Feet): 120 Feet Assistive device: 4-wheeled walker Gait Pattern/deviations: Step-through pattern;Decreased stride length;Shuffle;Trunk flexed     General Gait Details: pt with tendency to keep UE's extended and hold rollator far ahead. verbal cues required to improve proximity to RW. Cues needed for safe use of brakes upon return to bed; pt denies any dizziness with mobility   Stairs             Wheelchair Mobility    Modified  Rankin (Stroke Patients Only)       Balance                                            Cognition Arousal/Alertness: Awake/alert Behavior During Therapy: WFL for tasks assessed/performed                                          Exercises      General Comments        Pertinent Vitals/Pain Pain Assessment: No/denies pain    Home Living                      Prior Function            PT Goals (current goals can now be found in the care plan section) Progress towards PT goals: Progressing toward goals    Frequency    Min 2X/week      PT Plan Discharge plan needs to be updated;Frequency needs to be updated    Co-evaluation              AM-PAC PT "6 Clicks" Mobility   Outcome Measure  Help needed turning from your back to your side while in a flat bed without using bedrails?: None Help needed moving from lying on your back to sitting on the side  of a flat bed without using bedrails?: A Little Help needed moving to and from a bed to a chair (including a wheelchair)?: A Little Help needed standing up from a chair using your arms (e.g., wheelchair or bedside chair)?: A Little Help needed to walk in hospital room?: A Little Help needed climbing 3-5 steps with a railing? : A Little 6 Click Score: 19    End of Session Equipment Utilized During Treatment: Gait belt Activity Tolerance: Patient tolerated treatment well Patient left: in bed;with call bell/phone within reach;with nursing/sitter in room (with nurse tech - for bathing) Nurse Communication: Mobility status PT Visit Diagnosis: Muscle weakness (generalized) (M62.81);Difficulty in walking, not elsewhere classified (R26.2);Other abnormalities of gait and mobility (R26.89)     Time: 1130-1141 PT Time Calculation (min) (ACUTE ONLY): 11 min  Charges:  $Gait Training: 8-22 mins                    Paulino Door, DPT Acute Rehabilitation Services Pager:  [phone] Office: [phone]   Sarajane Jews 09/01/2020, 5:02 PM

## 2020-09-01 NOTE — TOC Initial Note (Signed)
Transition of Care [facility]) - Initial/Assessment Note    Patient Details  Name: Jennifer Zhang MRN: [id number] Date of Birth: March 25, 1926  [facility] CM/SW Contact:    Bartholome Bill, RN Phone Number: 09/01/2020, 3:15 PM  Clinical Narrative:                 Spoke with pt at bedside and daughter Pearly via phone. They are interested in SNF for rehab. FL2 faxed out for bed availability. Attempted to start insurance auth with [facility]. Per [facility] representative they will have to do an eligibility check as there is a discrepancy with some of her information in their system.   Bed offers given to Pearly. Pearly asks that I call [facility] as they did not respond in the hub. French Ana from [employer] contacted and voicemail left. Awaiting return call.   Expected Discharge Plan: Skilled Nursing Facility Barriers to Discharge: Insurance Authorization   Patient Goals and CMS Choice Patient states their goals for this hospitalization and ongoing recovery are:: To get home      Expected Discharge Plan and Services Expected Discharge Plan: Skilled Nursing Facility   Discharge Planning Services: CM Consult   Living arrangements for the past 2 months: Apartment                                      Prior Living Arrangements/Services Living arrangements for the past 2 months: Apartment Lives with:: Self                   Activities of Daily Living Home Assistive Devices/Equipment: Eyeglasses, CBG Meter, Walker (specify type), Other (Comment) (tub/shower unit, 4 wheeled walker) ADL Screening (condition at time of admission) Patient's cognitive ability adequate to safely complete daily activities?: Yes Is the patient deaf or have difficulty hearing?: No Does the patient have difficulty seeing, even when wearing glasses/contacts?: No Does the patient have difficulty concentrating, remembering, or making decisions?: No Patient able to express need for  assistance with ADLs?: Yes Does the patient have difficulty dressing or bathing?: Yes Independently performs ADLs?: No Communication: Independent Dressing (OT): Needs assistance Is this a change from baseline?: Change from baseline, expected to last >3 days Grooming: Independent Feeding: Independent Bathing: Needs assistance Is this a change from baseline?: Change from baseline, expected to last >3 days Toileting: Needs assistance Is this a change from baseline?: Change from baseline, expected to last >3days In/Out Bed: Needs assistance Is this a change from baseline?: Change from baseline, expected to last >3 days Walks in Home: Needs assistance Is this a change from baseline?: Change from baseline, expected to last >3 days Does the patient have difficulty walking or climbing stairs?: Yes Weakness of Legs: Both Weakness of Arms/Hands: None  Permission Sought/Granted                  Emotional Assessment              Admission diagnosis:  Dizziness [R42] Orthostatic dizziness [R42] Patient Active Problem List   Diagnosis Date Noted   Orthostatic dizziness 09/01/2020   Dizziness 08/30/2020   Hav (hallux abducto valgus), unspecified laterality 03/30/2020   Pes planus 03/30/2020   Near syncope 12/22/2019   Type 2 diabetes mellitus without complication (HCC) 12/21/2019   HTN (hypertension) 12/21/2019   Hypothyroidism 12/21/2019   Chronic swimmer's ear of both sides 12/05/2018   Impacted  cerumen of both ears 12/05/2018   Sensorineural hearing loss (SNHL), bilateral 12/05/2018   PCP:  [name], MD Pharmacy:   [facility] [phone] - [place], [place] - [address] AT [facility] [address] [place] [address] Phone: [phone] Fax: [phone]     Social Determinants of Health (SDOH) Interventions    Readmission Risk Interventions No flowsheet data found.

## 2020-09-01 NOTE — Care Management Obs Status (Signed)
MEDICARE OBSERVATION STATUS NOTIFICATION   Patient Details  Name: MINETTE MANDERS MRN: [id number] Date of Birth: 1926/07/01   Medicare Observation Status Notification Given:  Yes    Bartholome Bill, RN 09/01/2020, 10:32 AM

## 2020-09-01 NOTE — Progress Notes (Signed)
PROGRESS NOTE    Jennifer Zhang  OZH:[id number] DOB: 01/03/1926 DOA: 08/30/2020 PCP: [name], MD   Brief Narrative:  HPI per Dr. Whitney Post on 08/30/20 This is a 86 year old female with past medical history of diabetes, hypertension, hypothyroidism who presented to the ED after feeling lightheadedness falling when waking up this morning.  Patient fell back onto her bed when getting out of bed and was unable to get up. Had generalized weakness. She lives by herself at home and called EMS to help her get up. No slurred speech, no focal weakness otherwise, no visual changes, no headache no chest pain or shortness of breath, fever or chills or any other issues.  Had an episode like this in the past patient was admitted for presyncopal episode back in March had nausea and vomiting at that time that was thought to be related to hyperglycemia and dehydration.   ED Course: Afebrile, hemodynamically stable, on room air.  Labs overall unremarkable. Notable Imaging: CT head concerning for possible acute/subacute left cerebellar infarct with additional remote appearing bilateral cerebellar infarcts.. Patient received Antivert.   **Interim History  Dizziness is improving she was nauseous today.  We will continue IV fluid hydration and repeat orthostatics in the a.m.  She did not meet the blood pressure clinic for orthostasis but her heart rate did.  We will order TED hose and continue monitor carefully.  We will discontinue her hydrochlorothiazide.  Assessment & Plan:   Principal Problem:   Near syncope Active Problems:   Type 2 diabetes mellitus without complication (HCC)   Hypothyroidism   Dizziness   Orthostatic dizziness  Acute presyncopal episode likely orthostatic in nature, ongoing -Patient remarks on poor p.o. intake in the past few weeks with diminished appetite; she had some nausea earlier this morning but no actual vomiting; nausea was short-lived -IV fluid hydration was  continued but will reduce to 75 MLS per hour -Repeat orthostatics did not show that she dropped from a blood pressure standpoint but she did meet the criteria from a heart rate standpoint -She does note chronic issues with "dizziness" when standing up too quickly -We discussed possible need for reduction of home antihypertensives and now I have stopped her hydrochlorothiazide -We will order TED hose and repeat orthostatics in the a.m. -PT OT following, currently recommending SNF -Anticipate discharging to SNF in the next 24 to 48 hours  Acute CVA ruled out -Imaging including CT, MRI, carotid ultrasounds were negative for any acute findings or infarcts -Remote strokes were noted on imaging but again nothing acute -PT OT following given above per protocol  -Currently recommending SNF placement due to ambulatory dysfunction  Hypothyroidism -TSH within normal limits -C/w Levothyroxine 50 mcg po Daily   Hypertension, essential -Continue home medications but will discontinue HCTZ 25 mg Daily -C/w Lisinopril 20 mg po daily and Verapamil 240 mg po Daily and continue with isosorbide mononitrate 10 mg p.o. twice daily -Blood pressure was 154/81 Continue monitor blood pressures per protocol  HLD -Total cholesterol/HDL ratio was 2.5, cholesterol was 170, HDL was 69, LDL was 93, triglycerides was 39, VLDL was 8 -C/w Atorvastatin 40 mg po qHS  Insulin-dependent diabetes type 2, moderately well controlled -Recent HbA1c was 7.5 on 08/30/20 -CBG'sr ranging from 128-175; Blood Sugar on CMP was 120 this AM -C/w Lantus 10 units sq qHS and Sensitive Novolog SSI AC  Normocytic Anemia -Patient's Hgb/Hct went from 11.8/36.7 -> 10.6/32.9 -? Dilutional Drop -Check Anemia Panel in the AM  -Continue to Monitor  for S/Sx of [place]  Obesity -Complicates overall prognosis and care -Estimated body mass index is 33.07 kg/m as calculated from the following:   Height as of this encounter: 5' (1.524 m).    Weight as of this encounter: 76.8 kg. -Weight Loss and Dietary Counseling given   DVT prophylaxis: Enoxaparin 40 mg subcu every 24 Code Status: FULL CODE  Family Communication: No family present at bedside  Disposition Plan: SNF in the next 24 to 48 hours if insurance is approved  Status is: Inpatient  Remains inpatient appropriate because:Unsafe d/c plan, IV treatments appropriate due to intensity of illness or inability to take PO and Inpatient level of care appropriate due to severity of illness   Dispo: The patient is from: Home              Anticipated d/c is to: SNF              Anticipated d/c date is: 1 day              Patient currently is not medically stable to d/c.   Consultants:   None   Procedures:  ECHOCARDIOGRAM IMPRESSIONS    1. Left ventricular ejection fraction, by estimation, is 55 to 60%. The  left ventricle has normal function. The left ventricle has no regional  wall motion abnormalities. There is mild asymmetric left ventricular  hypertrophy of the basal-septal segment.  Left ventricular diastolic parameters are indeterminate.  2. Right ventricular systolic function is normal. The right ventricular  size is normal. There is moderately elevated pulmonary artery systolic  pressure.  3. Left atrial size was mildly dilated.  4. The mitral valve is normal in structure. Mild mitral valve  regurgitation. No evidence of mitral stenosis.  5. Tricuspid valve regurgitation is moderate.  6. The aortic valve is tricuspid. There is mild calcification of the  aortic valve. Aortic valve regurgitation is not visualized. Mild aortic  valve sclerosis is present, with no evidence of aortic valve stenosis.  7. The inferior vena cava is normal in size with greater than 50%  respiratory variability, suggesting right atrial pressure of 3 mmHg.   Comparison(s): No prior Echocardiogram.   Conclusion(s)/Recommendation(s): Otherwise normal echocardiogram, with   minor abnormalities described in the report. No intracardiac source of  embolism detected on this transthoracic study. A transesophageal  echocardiogram is recommended to exclude  cardiac source of embolism if clinically indicated.   FINDINGS  Left Ventricle: Left ventricular ejection fraction, by estimation, is 55  to 60%. The left ventricle has normal function. The left ventricle has no  regional wall motion abnormalities. The left ventricular internal cavity  size was normal in size. There is  mild asymmetric left ventricular hypertrophy of the basal-septal segment.  Left ventricular diastolic parameters are indeterminate.   Right Ventricle: The right ventricular size is normal. No increase in  right ventricular wall thickness. Right ventricular systolic function is  normal. There is moderately elevated pulmonary artery systolic pressure.  The tricuspid regurgitant velocity is  3.52 m/s, and with an assumed right atrial pressure of 3 mmHg, the  estimated right ventricular systolic pressure is 52.6 mmHg.   Left Atrium: Left atrial size was mildly dilated.   Right Atrium: Right atrial size was normal in size.   Pericardium: There is no evidence of pericardial effusion.   Mitral Valve: The mitral valve is normal in structure. Mild to moderate  mitral annular calcification. Mild mitral valve regurgitation. No evidence  of mitral valve stenosis.  Tricuspid Valve: The tricuspid valve is normal in structure. Tricuspid  valve regurgitation is moderate.   Aortic Valve: The aortic valve is tricuspid. There is mild calcification  of the aortic valve. Aortic valve regurgitation is not visualized. Mild  aortic valve sclerosis is present, with no evidence of aortic valve  stenosis.   Pulmonic Valve: The pulmonic valve was not well visualized. Pulmonic valve  regurgitation is trivial. No evidence of pulmonic stenosis.   Aorta: The aortic root, ascending aorta and aortic arch are all   structurally normal, with no evidence of dilitation or obstruction.   Venous: The inferior vena cava is normal in size with greater than 50%  respiratory variability, suggesting right atrial pressure of 3 mmHg.   IAS/Shunts: No atrial level shunt detected by color flow Doppler.     LEFT VENTRICLE  PLAX 2D  LVIDd:     3.80 cm Diastology  LVIDs:     2.63 cm LV e' medial:  7.83 cm/s  LV PW:     0.90 cm LV E/e' medial: 11.6  LV IVS:    0.70 cm LV e' lateral:  9.68 cm/s  LVOT diam:   2.00 cm LV E/e' lateral: 9.4  LV SV:     76  LV SV Index:  44  LVOT Area:   3.14 cm     RIGHT VENTRICLE  RV S prime:   18.20 cm/s  TAPSE (M-mode): 2.0 cm   LEFT ATRIUM       Index    RIGHT ATRIUM      Index  LA diam:    3.20 cm 1.84 cm/m RA Area:   11.60 cm  LA Vol (A2C):  47.0 ml 27.03 ml/m RA Volume:  24.30 ml 13.97 ml/m  LA Vol (A4C):  52.3 ml 30.07 ml/m  LA Biplane Vol: 50.6 ml 29.10 ml/m  AORTIC VALVE  LVOT Vmax:  88.90 cm/s  LVOT Vmean: 62.800 cm/s  LVOT VTI:  0.243 m    AORTA  Ao Root diam: 3.00 cm  Ao Asc diam: 3.30 cm   MITRAL VALVE        TRICUSPID VALVE  MV Area (PHT): 2.56 cm   TR Peak grad:  49.6 mmHg  MV Decel Time: 296 msec   TR Vmax:    352.00 cm/s  MR PISA:    0.57 cm  MR PISA Radius: 0.30 cm   SHUNTS  MV E velocity: 91.20 cm/s  Systemic VTI: 0.24 m  MV A velocity: 110.00 cm/s Systemic Diam: 2.00 cm  MV E/A ratio: 0.83   Antimicrobials:  Anti-infectives (From admission, onward)   None        Subjective: Seen and examined at bedside and states that she is nauseous this morning but not actually vomit.  Denies any chest pain, or shortness of breath.  Tends to get a little lightheaded when she stands up too quickly but was not feeling lightheaded laying in the bed.  Just nauseous.  Has not been eating very well.  No other concerns or complaints at this  time.  Objective: Vitals:   08/31/20 0944 08/31/20 2031 09/01/20 0537 09/01/20 0946  BP: (!) 154/75 (!) 151/76 (!) 148/76 (!) 154/81  Pulse:  77 80   Resp:  16 14   Temp:  98.6 F (37 C) 98.7 F (37.1 C)   TempSrc:  Oral Oral   SpO2:  95% 100%   Weight:      Height:  Intake/Output Summary (Last 24 hours) at 09/01/2020 1644 Last data filed at 09/01/2020 1232 Gross per 24 hour  Intake --  Output 900 ml  Net -900 ml   Filed Weights   08/30/20 1613  Weight: 76.8 kg   Examination: Physical Exam:  Constitutional: WN/WD elderly obese African-American female in NAD and appears calm and comfortable Eyes: Lids and conjunctivae normal, sclerae anicteric  ENMT: External Ears, Nose appear normal. Grossly normal hearing.  Neck: Appears normal, supple, no cervical masses, normal ROM, no appreciable thyromegaly; no JVD Respiratory: Diminished to auscultation bilaterally, no wheezing, rales, rhonchi or crackles. Normal respiratory effort and patient is not tachypenic. No accessory muscle use.  Unlabored breathing Cardiovascular: RRR, no murmurs / rubs / gallops. S1 and S2 auscultated.  Trace extremity edema Abdomen: Soft, non-tender, distended secondary body habitus. Bowel sounds positive.  GU: Deferred. Musculoskeletal: No clubbing / cyanosis of digits/nails. No joint deformity upper and lower extremities.  Skin: No rashes, lesions, ulcers on limited skin evaluation. No induration; Warm and dry.  Neurologic: CN 2-12 grossly intact with no focal deficits. Romberg sign and cerebellar reflexes not assessed.  Psychiatric: Normal judgment and insight. Alert and oriented x 3.  Pleasant and normal mood and appropriate affect.   Data Reviewed: I have personally reviewed following labs and imaging studies  CBC: Recent Labs  Lab 08/30/20 1022 09/01/20 0619  WBC 8.3 7.1  NEUTROABS 6.0  --   HGB 11.8* 10.6*  HCT 36.7 32.9*  MCV 91.8 91.1  PLT 195 181   Basic Metabolic Panel: Recent  Labs  Lab 08/30/20 1022 09/01/20 0619  NA 141 138  K 4.2 3.5  CL 104 105  CO2 27 24  GLUCOSE 144* 120*  BUN 20 14  CREATININE 0.90 0.70  CALCIUM 9.4 8.6*   GFR: Estimated Creatinine Clearance: 41.1 mL/min (by C-G formula based on SCr of 0.7 mg/dL). Liver Function Tests: Recent Labs  Lab 08/30/20 1022  AST 19  ALT 16  ALKPHOS 67  BILITOT 0.5  PROT 7.6  ALBUMIN 4.4   No results for input(s): LIPASE, AMYLASE in the last 168 hours. No results for input(s): AMMONIA in the last 168 hours. Coagulation Profile: Recent Labs  Lab 08/30/20 1022  INR 1.0   Cardiac Enzymes: No results for input(s): CKTOTAL, CKMB, CKMBINDEX, TROPONINI in the last 168 hours. BNP (last 3 results) No results for input(s): PROBNP in the last 8760 hours. HbA1C: Recent Labs    08/30/20 1022  HGBA1C 7.5*   CBG: Recent Labs  Lab 08/31/20 1228 08/31/20 1741 08/31/20 2124 09/01/20 0740 09/01/20 1158  GLUCAP 150* 138* 161* 128* 175*   Lipid Profile: Recent Labs    08/30/20 1623  CHOL 170  HDL 69  LDLCALC 93  TRIG 39  CHOLHDL 2.5   Thyroid Function Tests: Recent Labs    08/31/20 0631  TSH 2.905   Anemia Panel: No results for input(s): VITAMINB12, FOLATE, FERRITIN, TIBC, IRON, RETICCTPCT in the last 72 hours. Sepsis Labs: No results for input(s): PROCALCITON, LATICACIDVEN in the last 168 hours.  Recent Results (from the past 240 hour(s))  Resp Panel by RT-PCR (Flu A&B, Covid) Nasopharyngeal Swab     Status: None   Collection Time: 08/30/20 12:58 PM   Specimen: Nasopharyngeal Swab; Nasopharyngeal(NP) swabs in vial transport medium  Result Value Ref Range Status   SARS Coronavirus 2 by RT PCR NEGATIVE NEGATIVE Final    Comment: (NOTE) SARS-CoV-2 target nucleic acids are NOT DETECTED.  The SARS-CoV-2 RNA is  generally detectable in upper respiratory specimens during the acute phase of infection. The lowest concentration of SARS-CoV-2 viral copies this assay can detect is 138  copies/mL. A negative result does not preclude SARS-Cov-2 infection and should not be used as the sole basis for treatment or other patient management decisions. A negative result may occur with  improper specimen collection/handling, submission of specimen other than nasopharyngeal swab, presence of viral mutation(s) within the areas targeted by this assay, and inadequate number of viral copies(<138 copies/mL). A negative result must be combined with clinical observations, patient history, and epidemiological information. The expected result is Negative.  Fact Sheet for Patients:  BloggerCourse.comhttps://www.fda.gov/media/152166/download  Fact Sheet for Healthcare Providers:  SeriousBroker.ithttps://www.fda.gov/media/152162/download  This test is no t yet approved or cleared by the [place] FDA and  has been authorized for detection and/or diagnosis of SARS-CoV-2 by FDA under an Emergency Use Authorization (EUA). This EUA will remain  in effect (meaning this test can be used) for the duration of the COVID-19 declaration under Section 564(b)(1) of the Act, 21 U.S.C.section 360bbb-3(b)(1), unless the authorization is terminated  or revoked sooner.       Influenza A by PCR NEGATIVE NEGATIVE Final   Influenza B by PCR NEGATIVE NEGATIVE Final    Comment: (NOTE) The Xpert Xpress SARS-CoV-2/FLU/RSV plus assay is intended as an aid in the diagnosis of influenza from Nasopharyngeal swab specimens and should not be used as a sole basis for treatment. Nasal washings and aspirates are unacceptable for Xpert Xpress SARS-CoV-2/FLU/RSV testing.  Fact Sheet for Patients: BloggerCourse.comhttps://www.fda.gov/media/152166/download  Fact Sheet for Healthcare Providers: SeriousBroker.ithttps://www.fda.gov/media/152162/download  This test is not yet approved or cleared by the [place] FDA and has been authorized for detection and/or diagnosis of SARS-CoV-2 by FDA under an Emergency Use Authorization (EUA). This EUA will remain in effect (meaning  this test can be used) for the duration of the COVID-19 declaration under Section 564(b)(1) of the Act, 21 U.S.C. section 360bbb-3(b)(1), unless the authorization is terminated or revoked.  Performed at [facility], [address] [place], [place] [address]     RN Pressure Injury Documentation:     Estimated body mass index is 33.07 kg/m as calculated from the following:   Height as of this encounter: 5' (1.524 m).   Weight as of this encounter: 76.8 kg.  Malnutrition Type:    Malnutrition Characteristics:    Nutrition Interventions:    Radiology Studies: ECHOCARDIOGRAM COMPLETE  Result Date: 08/31/2020    ECHOCARDIOGRAM REPORT   Patient Name:   Jennifer Zhang Date of Exam: 08/31/2020 Medical Rec #:  [id number]       Height:       60.0 in Accession #:    [id number]      Weight:       169.3 lb Date of Birth:  1926-06-21      BSA:          1.739 m Patient Age:    89 years        BP:           127/77 mmHg Patient Gender: F               HR:           74 bpm. Exam Location:  Inpatient Procedure: 2D Echo, Color Doppler and Cardiac Doppler Indications:    Stroke 434.91 / I163.9  History:        Patient has no prior history of Echocardiogram examinations.  Risk Factors:Diabetes and Hypertension.  Sonographer:    Eulah Pont RDCS Referring Phys: [id number] JARED E SEGAL IMPRESSIONS  1. Left ventricular ejection fraction, by estimation, is 55 to 60%. The left ventricle has normal function. The left ventricle has no regional wall motion abnormalities. There is mild asymmetric left ventricular hypertrophy of the basal-septal segment. Left ventricular diastolic parameters are indeterminate.  2. Right ventricular systolic function is normal. The right ventricular size is normal. There is moderately elevated pulmonary artery systolic pressure.  3. Left atrial size was mildly dilated.  4. The mitral valve is normal in structure. Mild mitral valve regurgitation. No  evidence of mitral stenosis.  5. Tricuspid valve regurgitation is moderate.  6. The aortic valve is tricuspid. There is mild calcification of the aortic valve. Aortic valve regurgitation is not visualized. Mild aortic valve sclerosis is present, with no evidence of aortic valve stenosis.  7. The inferior vena cava is normal in size with greater than 50% respiratory variability, suggesting right atrial pressure of 3 mmHg. Comparison(s): No prior Echocardiogram. Conclusion(s)/Recommendation(s): Otherwise normal echocardiogram, with minor abnormalities described in the report. No intracardiac source of embolism detected on this transthoracic study. A transesophageal echocardiogram is recommended to exclude cardiac source of embolism if clinically indicated. FINDINGS  Left Ventricle: Left ventricular ejection fraction, by estimation, is 55 to 60%. The left ventricle has normal function. The left ventricle has no regional wall motion abnormalities. The left ventricular internal cavity size was normal in size. There is  mild asymmetric left ventricular hypertrophy of the basal-septal segment. Left ventricular diastolic parameters are indeterminate. Right Ventricle: The right ventricular size is normal. No increase in right ventricular wall thickness. Right ventricular systolic function is normal. There is moderately elevated pulmonary artery systolic pressure. The tricuspid regurgitant velocity is 3.52 m/s, and with an assumed right atrial pressure of 3 mmHg, the estimated right ventricular systolic pressure is 52.6 mmHg. Left Atrium: Left atrial size was mildly dilated. Right Atrium: Right atrial size was normal in size. Pericardium: There is no evidence of pericardial effusion. Mitral Valve: The mitral valve is normal in structure. Mild to moderate mitral annular calcification. Mild mitral valve regurgitation. No evidence of mitral valve stenosis. Tricuspid Valve: The tricuspid valve is normal in structure. Tricuspid  valve regurgitation is moderate. Aortic Valve: The aortic valve is tricuspid. There is mild calcification of the aortic valve. Aortic valve regurgitation is not visualized. Mild aortic valve sclerosis is present, with no evidence of aortic valve stenosis. Pulmonic Valve: The pulmonic valve was not well visualized. Pulmonic valve regurgitation is trivial. No evidence of pulmonic stenosis. Aorta: The aortic root, ascending aorta and aortic arch are all structurally normal, with no evidence of dilitation or obstruction. Venous: The inferior vena cava is normal in size with greater than 50% respiratory variability, suggesting right atrial pressure of 3 mmHg. IAS/Shunts: No atrial level shunt detected by color flow Doppler.  LEFT VENTRICLE PLAX 2D LVIDd:         3.80 cm  Diastology LVIDs:         2.63 cm  LV e' medial:    7.83 cm/s LV PW:         0.90 cm  LV E/e' medial:  11.6 LV IVS:        0.70 cm  LV e' lateral:   9.68 cm/s LVOT diam:     2.00 cm  LV E/e' lateral: 9.4 LV SV:         76 LV SV Index:  44 LVOT Area:     3.14 cm  RIGHT VENTRICLE RV S prime:     18.20 cm/s TAPSE (M-mode): 2.0 cm LEFT ATRIUM             Index       RIGHT ATRIUM           Index LA diam:        3.20 cm 1.84 cm/m  RA Area:     11.60 cm LA Vol (A2C):   47.0 ml 27.03 ml/m RA Volume:   24.30 ml  13.97 ml/m LA Vol (A4C):   52.3 ml 30.07 ml/m LA Biplane Vol: 50.6 ml 29.10 ml/m  AORTIC VALVE LVOT Vmax:   88.90 cm/s LVOT Vmean:  62.800 cm/s LVOT VTI:    0.243 m  AORTA Ao Root diam: 3.00 cm Ao Asc diam:  3.30 cm MITRAL VALVE                TRICUSPID VALVE MV Area (PHT): 2.56 cm     TR Peak grad:   49.6 mmHg MV Decel Time: 296 msec     TR Vmax:        352.00 cm/s MR PISA:        0.57 cm MR PISA Radius: 0.30 cm     SHUNTS MV E velocity: 91.20 cm/s   Systemic VTI:  0.24 m MV A velocity: 110.00 cm/s  Systemic Diam: 2.00 cm MV E/A ratio:  0.83 Jodelle Red MD Electronically signed by Jodelle Red MD Signature Date/Time:  08/31/2020/2:09:19 PM    Final    Scheduled Meds:  atorvastatin  40 mg Oral QHS   enoxaparin (LOVENOX) injection  40 mg Subcutaneous Q24H   lisinopril  20 mg Oral Daily   And   hydrochlorothiazide  25 mg Oral Daily   insulin aspart  0-9 Units Subcutaneous TID WC   insulin glargine  10 Units Subcutaneous QHS   isosorbide mononitrate  10 mg Oral BID   levothyroxine  50 mcg Oral QAC breakfast   verapamil  240 mg Oral Daily   Continuous Infusions:  sodium chloride 1,000 mL (09/01/20 1501)    LOS: 0 days   Merlene Laughter, DO [facility] PAGER is on AMION  If 7PM-7AM, please contact night-coverage [email or web address]

## 2020-09-02 DIAGNOSIS — R42 Dizziness and giddiness: Secondary | ICD-10-CM | POA: Diagnosis not present

## 2020-09-02 DIAGNOSIS — E119 Type 2 diabetes mellitus without complications: Secondary | ICD-10-CM | POA: Diagnosis not present

## 2020-09-02 DIAGNOSIS — E039 Hypothyroidism, unspecified: Secondary | ICD-10-CM | POA: Diagnosis not present

## 2020-09-02 DIAGNOSIS — R55 Syncope and collapse: Secondary | ICD-10-CM | POA: Diagnosis not present

## 2020-09-02 LAB — GLUCOSE, CAPILLARY
Glucose-Capillary: 89 mg/dL (ref 70–99)
Glucose-Capillary: 71 mg/dL (ref 70–99)
Glucose-Capillary: 101 mg/dL — ABNORMAL HIGH (ref 70–99)
Glucose-Capillary: 93 mg/dL (ref 70–99)

## 2020-09-02 LAB — CBC WITH DIFFERENTIAL/PLATELET
Abs Immature Granulocytes: 0.03 10*3/uL (ref 0.00–0.07)
Basophils Absolute: 0 10*3/uL (ref 0.0–0.1)
Basophils Relative: 0 %
Eosinophils Absolute: 0.2 10*3/uL (ref 0.0–0.5)
Eosinophils Relative: 2 %
HCT: 33.4 % — ABNORMAL LOW (ref 36.0–46.0)
Hemoglobin: 10.7 g/dL — ABNORMAL LOW (ref 12.0–15.0)
Immature Granulocytes: 0 %
Lymphocytes Relative: 25 %
Lymphs Abs: 2.1 10*3/uL (ref 0.7–4.0)
MCH: 29.2 pg (ref 26.0–34.0)
MCHC: 32 g/dL (ref 30.0–36.0)
MCV: 91.3 fL (ref 80.0–100.0)
Monocytes Absolute: 0.7 10*3/uL (ref 0.1–1.0)
Monocytes Relative: 9 %
Neutro Abs: 5.2 10*3/uL (ref 1.7–7.7)
Neutrophils Relative %: 64 %
Platelets: 175 10*3/uL (ref 150–400)
RBC: 3.66 MIL/uL — ABNORMAL LOW (ref 3.87–5.11)
RDW: 15.6 % — ABNORMAL HIGH (ref 11.5–15.5)
WBC: 8.3 10*3/uL (ref 4.0–10.5)
nRBC: 0 % (ref 0.0–0.2)

## 2020-09-02 LAB — PHOSPHORUS: Phosphorus: 3.8 mg/dL (ref 2.5–4.6)

## 2020-09-02 LAB — COMPREHENSIVE METABOLIC PANEL
ALT: 19 U/L (ref 0–44)
AST: 23 U/L (ref 15–41)
Albumin: 3.6 g/dL (ref 3.5–5.0)
Alkaline Phosphatase: 52 U/L (ref 38–126)
Anion gap: 8 (ref 5–15)
BUN: 11 mg/dL (ref 8–23)
CO2: 25 mmol/L (ref 22–32)
Calcium: 8.9 mg/dL (ref 8.9–10.3)
Chloride: 106 mmol/L (ref 98–111)
Creatinine, Ser: 0.67 mg/dL (ref 0.44–1.00)
GFR, Estimated: >60 mL/min (ref >60)
Glucose, Bld: 86 mg/dL (ref 70–99)
Potassium: 3.4 mmol/L — ABNORMAL LOW (ref 3.5–5.1)
Sodium: 139 mmol/L (ref 135–145)
Total Bilirubin: 0.9 mg/dL (ref 0.3–1.2)
Total Protein: 6.3 g/dL — ABNORMAL LOW (ref 6.5–8.1)

## 2020-09-02 LAB — MAGNESIUM: Magnesium: 1.9 mg/dL (ref 1.7–2.4)

## 2020-09-02 MED ORDER — POTASSIUM CHLORIDE CRYS ER 20 MEQ PO TBCR
40.0000 meq | EXTENDED_RELEASE_TABLET | Freq: Two times a day (BID) | ORAL | Status: AC
  Administered 2020-09-02 (×2): 40 meq via ORAL
  Filled 2020-09-02 (×2): qty 2

## 2020-09-02 MED ORDER — HYDRALAZINE HCL 20 MG/ML IJ SOLN: 10.0000 mg | Freq: Four times a day (QID) | INTRAMUSCULAR | Status: DC | PRN

## 2020-09-02 NOTE — TOC Progression Note (Signed)
Transition of Care [facility]) - Progression Note    Patient Details  Name: Jennifer Zhang MRN: [id number] Date of Birth: 1925-12-25  Transition of Care [facility]) CM/SW Contact  Morayma Godown, Meriam Sprague, RN Phone Number: 09/02/2020, 9:52 AM  Clinical Narrative:    Spoke with daughter again this am. She has changed her mind about SNF and wants pt to go to [facility] now. [facility] liaison contacted to accept bed. [facility] to start [place] with [employer]. TOC will continue to follow.   Expected Discharge Plan: Skilled Nursing Facility Barriers to Discharge: Insurance Authorization  Expected Discharge Plan and Services Expected Discharge Plan: Skilled Nursing Facility   Discharge Planning Services: CM Consult   Living arrangements for the past 2 months: Apartment                                       Social Determinants of Health (SDOH) Interventions    Readmission Risk Interventions No flowsheet data found.

## 2020-09-02 NOTE — Progress Notes (Signed)
PROGRESS NOTE    Jennifer Zhang  ZOX:[id number] DOB: 1926-05-04 DOA: 08/30/2020 PCP: [name], MD   Brief Narrative:  HPI per Dr. Whitney Post on 08/30/20 This is a 86 year old female with past medical history of diabetes, hypertension, hypothyroidism who presented to the ED after feeling lightheadedness falling when waking up this morning.  Patient fell back onto her bed when getting out of bed and was unable to get up. Had generalized weakness. She lives by herself at home and called EMS to help her get up. No slurred speech, no focal weakness otherwise, no visual changes, no headache no chest pain or shortness of breath, fever or chills or any other issues.  Had an episode like this in the past patient was admitted for presyncopal episode back in March had nausea and vomiting at that time that was thought to be related to hyperglycemia and dehydration.   ED Course: Afebrile, hemodynamically stable, on room air.  Labs overall unremarkable. Notable Imaging: CT head concerning for possible acute/subacute left cerebellar infarct with additional remote appearing bilateral cerebellar infarcts.. Patient received Antivert.   **Interim History  Dizziness has improving but she was nauseous yesterday and not today.  IV fluids were continued until today and this was discontinued now and repeat orthostatics done today show that she did not drop.  We will order TED hose and continue monitor carefully.  We will discontinue her hydrochlorothiazide.  Currently she is medically stable to be discharged to skilled nursing facility and will be discharged once bed is available however insurance authorization needs to be obtained and insurance authorization process was started today and per the [facility] she will likely not be discharged until Saturday.  Assessment & Plan:   Principal Problem:   Near syncope Active Problems:   Type 2 diabetes mellitus without complication (HCC)   Hypothyroidism    Dizziness   Orthostatic dizziness  Acute presyncopal episode likely orthostatic in nature, improved -Patient remarks on poor p.o. intake in the past few weeks with diminished appetite; she had some nausea earlier this morning but no actual vomiting; nausea was short-lived and she is no longer nauseous today -IV fluid hydration was continued but will reduce to 75 MLS per hour and will stop IV fluid hydration today -She does note chronic issues with "dizziness" when standing up too quickly; was not dizzy today -We discussed possible need for reduction of home antihypertensives and now I have stopped her hydrochlorothiazide -We will order TED hose and repeat orthostatics in the a.m; she is not orthostatic on this morning's vital sign check. -PT OT following, currently recommending SNF -she is medically stable to be discharged to SNF but unfortunately insurance authorization has not been obtained and likely will not be discharged until Saturday.  TOC  Acute CVA ruled out -Imaging including CT, MRI, carotid ultrasounds were negative for any acute findings or infarcts -Remote strokes were noted on imaging but again nothing acute -PT OT following given above per protocol  -Currently recommending SNF placement due to ambulatory dysfunction; she is medically stable to be discharged to SNF but now needs insurance authorization  Hypothyroidism -TSH within normal limits -C/w Levothyroxine 50 mcg po Daily   Hypertension, essential -Continue home medications but will discontinue HCTZ 25 mg Daily heart orthostasis -C/w Lisinopril 20 mg po daily and Verapamil 240 mg po Daily and continue with isosorbide mononitrate 10 mg p.o. twice daily -Blood pressure was 178/112 -We will add hydralazine 10 mg every 6 hours as needed for systolic  blood pressure greater than 180 or diastolic blood pressure in 100 -Continue monitor blood pressures per protocol  HLD -Total cholesterol/HDL ratio was 2.5, cholesterol was  170, HDL was 69, LDL was 93, triglycerides was 39, VLDL was 8 -C/w Atorvastatin 40 mg po qHS  Insulin-dependent diabetes type 2, moderately well controlled -Recent HbA1c was 7.5 on 08/30/20 -CBG'sr ranging from 89-1 75; Blood Sugar on CMP was 86 this AM -C/w Lantus 10 units sq qHS and Sensitive Novolog SSI AC  Normocytic Anemia -Patient's Hgb/Hct went from 11.8/36.7 -> 10.6/32.9 and today it is 10.7/32.4 and stable -? Dilutional Drop -Check Anemia Panel in the AM  -Continue to Monitor for S/Sx of [place]; currently no overt bleeding noted -Repeat CBC in a.m.  Hypokalemia -Mild -Patient's potassium this morning was 3.4 -Magnesium level was within normal limits at 1.9 -Replete with p.o. KCl 40 mg twice daily x2 doses -Repeat CMP in a.m.  Obesity -Complicates overall prognosis and care -Estimated body mass index is 33.07 kg/m as calculated from the following:   Height as of this encounter: 5' (1.524 m).   Weight as of this encounter: 76.8 kg. -Weight Loss and Dietary Counseling given   DVT prophylaxis: Enoxaparin 40 mg subcu every 24 Code Status: FULL CODE  Family Communication: No family present at bedside  Disposition Plan: SNF in the next 24 to 48 hours if insurance is approved; unfortunately insurance authorization was restarted today but she has been deemed medically stable to be discharged  Status is: Inpatient  Remains inpatient appropriate because:Unsafe d/c plan, IV treatments appropriate due to intensity of illness or inability to take PO and Inpatient level of care appropriate due to severity of illness   Dispo: The patient is from: Home              Anticipated d/c is to: SNF              Anticipated d/c date is: 1 day whenever her insurance authorization is obtained              Patient currently is medically stable to d/c.   Consultants:   None   Procedures:  ECHOCARDIOGRAM IMPRESSIONS    1. Left ventricular ejection fraction, by estimation, is 55  to 60%. The  left ventricle has normal function. The left ventricle has no regional  wall motion abnormalities. There is mild asymmetric left ventricular  hypertrophy of the basal-septal segment.  Left ventricular diastolic parameters are indeterminate.  2. Right ventricular systolic function is normal. The right ventricular  size is normal. There is moderately elevated pulmonary artery systolic  pressure.  3. Left atrial size was mildly dilated.  4. The mitral valve is normal in structure. Mild mitral valve  regurgitation. No evidence of mitral stenosis.  5. Tricuspid valve regurgitation is moderate.  6. The aortic valve is tricuspid. There is mild calcification of the  aortic valve. Aortic valve regurgitation is not visualized. Mild aortic  valve sclerosis is present, with no evidence of aortic valve stenosis.  7. The inferior vena cava is normal in size with greater than 50%  respiratory variability, suggesting right atrial pressure of 3 mmHg.   Comparison(s): No prior Echocardiogram.   Conclusion(s)/Recommendation(s): Otherwise normal echocardiogram, with  minor abnormalities described in the report. No intracardiac source of  embolism detected on this transthoracic study. A transesophageal  echocardiogram is recommended to exclude  cardiac source of embolism if clinically indicated.   FINDINGS  Left Ventricle: Left ventricular ejection fraction, by  estimation, is 55  to 60%. The left ventricle has normal function. The left ventricle has no  regional wall motion abnormalities. The left ventricular internal cavity  size was normal in size. There is  mild asymmetric left ventricular hypertrophy of the basal-septal segment.  Left ventricular diastolic parameters are indeterminate.   Right Ventricle: The right ventricular size is normal. No increase in  right ventricular wall thickness. Right ventricular systolic function is  normal. There is moderately elevated pulmonary  artery systolic pressure.  The tricuspid regurgitant velocity is  3.52 m/s, and with an assumed right atrial pressure of 3 mmHg, the  estimated right ventricular systolic pressure is 52.6 mmHg.   Left Atrium: Left atrial size was mildly dilated.   Right Atrium: Right atrial size was normal in size.   Pericardium: There is no evidence of pericardial effusion.   Mitral Valve: The mitral valve is normal in structure. Mild to moderate  mitral annular calcification. Mild mitral valve regurgitation. No evidence  of mitral valve stenosis.   Tricuspid Valve: The tricuspid valve is normal in structure. Tricuspid  valve regurgitation is moderate.   Aortic Valve: The aortic valve is tricuspid. There is mild calcification  of the aortic valve. Aortic valve regurgitation is not visualized. Mild  aortic valve sclerosis is present, with no evidence of aortic valve  stenosis.   Pulmonic Valve: The pulmonic valve was not well visualized. Pulmonic valve  regurgitation is trivial. No evidence of pulmonic stenosis.   Aorta: The aortic root, ascending aorta and aortic arch are all  structurally normal, with no evidence of dilitation or obstruction.   Venous: The inferior vena cava is normal in size with greater than 50%  respiratory variability, suggesting right atrial pressure of 3 mmHg.   IAS/Shunts: No atrial level shunt detected by color flow Doppler.     LEFT VENTRICLE  PLAX 2D  LVIDd:     3.80 cm Diastology  LVIDs:     2.63 cm LV e' medial:  7.83 cm/s  LV PW:     0.90 cm LV E/e' medial: 11.6  LV IVS:    0.70 cm LV e' lateral:  9.68 cm/s  LVOT diam:   2.00 cm LV E/e' lateral: 9.4  LV SV:     76  LV SV Index:  44  LVOT Area:   3.14 cm     RIGHT VENTRICLE  RV S prime:   18.20 cm/s  TAPSE (M-mode): 2.0 cm   LEFT ATRIUM       Index    RIGHT ATRIUM      Index  LA diam:    3.20 cm 1.84 cm/m RA Area:   11.60 cm  LA Vol (A2C):   47.0 ml 27.03 ml/m RA Volume:  24.30 ml 13.97 ml/m  LA Vol (A4C):  52.3 ml 30.07 ml/m  LA Biplane Vol: 50.6 ml 29.10 ml/m  AORTIC VALVE  LVOT Vmax:  88.90 cm/s  LVOT Vmean: 62.800 cm/s  LVOT VTI:  0.243 m    AORTA  Ao Root diam: 3.00 cm  Ao Asc diam: 3.30 cm   MITRAL VALVE        TRICUSPID VALVE  MV Area (PHT): 2.56 cm   TR Peak grad:  49.6 mmHg  MV Decel Time: 296 msec   TR Vmax:    352.00 cm/s  MR PISA:    0.57 cm  MR PISA Radius: 0.30 cm   SHUNTS  MV E velocity: 91.20 cm/s  Systemic VTI: 0.24  m  MV A velocity: 110.00 cm/s Systemic Diam: 2.00 cm  MV E/A ratio: 0.83   Antimicrobials:  Anti-infectives (From admission, onward)   None        Subjective: Seen and examined at bedside and and she is resting and not nauseous.  Denies any lightheadedness, dizziness, chest pain or shortness of breath.  Felt well and states that she slept okay.  No other concerns or complaints at this time.  She has been deemed medically stable to be discharged to skilled nursing facility however reportedly no informed authorization has been obtained and cannot be discharged until then.  Objective: Vitals:   09/01/20 2139 09/02/20 0635 09/02/20 0947 09/02/20 0955  BP: (!) 180/109 (!) 159/82 (!) 153/84 (!) 178/112  Pulse: 90 76 77 98  Resp: 18 16 16    Temp: 97.8 F (36.6 C) 98.2 F (36.8 C) 98.4 F (36.9 C)   TempSrc: Oral Oral Oral   SpO2: 98% 97% 97%   Weight:      Height:        Intake/Output Summary (Last 24 hours) at 09/02/2020 1124 Last data filed at 09/02/2020 [id number] Gross per 24 hour  Intake 2524.57 ml  Output 1400 ml  Net 1124.57 ml   Filed Weights   08/30/20 1613  Weight: 76.8 kg   Examination: Physical Exam:  Constitutional: WN/WD elderly obese African-American female currently no acute distress appears calm and comfortable. Eyes: Lids and conjunctivae normal, sclerae anicteric  ENMT: External Ears, Nose appear normal. Grossly  normal hearing.  Neck: Appears normal, supple, no cervical masses, normal ROM, no appreciable thyromegaly or JVD Respiratory: Slightly diminished to auscultation bilaterally with coarse breath sounds, no wheezing, rales, rhonchi or crackles. Normal respiratory effort and patient is not tachypenic. No accessory muscle use. Unlabored breathing Cardiovascular: RRR, no murmurs / rubs / gallops. S1 and S2 auscultated.  Minimal extremity edema Abdomen: Soft, non-tender, distended secondary body habitus. Bowel sounds positive.  GU: Deferred. Musculoskeletal: No clubbing / cyanosis of digits/nails. No joint deformity upper and lower extremities.  Skin: No rashes, lesions, ulcers on a limited skin evaluation. No induration; Warm and dry.  Neurologic: CN 2-12 grossly intact with no focal deficits. Romberg sign and cerebellar reflexes not assessed.  Psychiatric: Normal judgment and insight. Alert and oriented x 3. Pleasant and Normal mood and appropriate affect.   Data Reviewed: I have personally reviewed following labs and imaging studies  CBC: Recent Labs  Lab 08/30/20 1022 09/01/20 0619 09/02/20 0632  WBC 8.3 7.1 8.3  NEUTROABS 6.0  --  5.2  HGB 11.8* 10.6* 10.7*  HCT 36.7 32.9* 33.4*  MCV 91.8 91.1 91.3  PLT 195 181 175   Basic Metabolic Panel: Recent Labs  Lab 08/30/20 1022 09/01/20 0619 09/02/20 0632  NA 141 138 139  K 4.2 3.5 3.4*  CL 104 105 106  CO2 27 24 25   GLUCOSE 144* 120* 86  BUN 20 14 11   CREATININE 0.90 0.70 0.67  CALCIUM 9.4 8.6* 8.9  MG  --   --  1.9  PHOS  --   --  3.8   GFR: Estimated Creatinine Clearance: 41.1 mL/min (by C-G formula based on SCr of 0.67 mg/dL). Liver Function Tests: Recent Labs  Lab 08/30/20 1022 09/02/20 0632  AST 19 23  ALT 16 19  ALKPHOS 67 52  BILITOT 0.5 0.9  PROT 7.6 6.3*  ALBUMIN 4.4 3.6   No results for input(s): LIPASE, AMYLASE in the last 168 hours. No results for input(s):  AMMONIA in the last 168 hours. Coagulation  Profile: Recent Labs  Lab 08/30/20 1022  INR 1.0   Cardiac Enzymes: No results for input(s): CKTOTAL, CKMB, CKMBINDEX, TROPONINI in the last 168 hours. BNP (last 3 results) No results for input(s): PROBNP in the last 8760 hours. HbA1C: No results for input(s): HGBA1C in the last 72 hours. CBG: Recent Labs  Lab 09/01/20 0740 09/01/20 1158 09/01/20 1655 09/01/20 2147 09/02/20 0728  GLUCAP 128* 175* 91 99 89   Lipid Profile: Recent Labs    08/30/20 1623  CHOL 170  HDL 69  LDLCALC 93  TRIG 39  CHOLHDL 2.5   Thyroid Function Tests: Recent Labs    08/31/20 0631  TSH 2.905   Anemia Panel: No results for input(s): VITAMINB12, FOLATE, FERRITIN, TIBC, IRON, RETICCTPCT in the last 72 hours. Sepsis Labs: No results for input(s): PROCALCITON, LATICACIDVEN in the last 168 hours.  Recent Results (from the past 240 hour(s))  Resp Panel by RT-PCR (Flu A&B, Covid) Nasopharyngeal Swab     Status: None   Collection Time: 08/30/20 12:58 PM   Specimen: Nasopharyngeal Swab; Nasopharyngeal(NP) swabs in vial transport medium  Result Value Ref Range Status   SARS Coronavirus 2 by RT PCR NEGATIVE NEGATIVE Final    Comment: (NOTE) SARS-CoV-2 target nucleic acids are NOT DETECTED.  The SARS-CoV-2 RNA is generally detectable in upper respiratory specimens during the acute phase of infection. The lowest concentration of SARS-CoV-2 viral copies this assay can detect is 138 copies/mL. A negative result does not preclude SARS-Cov-2 infection and should not be used as the sole basis for treatment or other patient management decisions. A negative result may occur with  improper specimen collection/handling, submission of specimen other than nasopharyngeal swab, presence of viral mutation(s) within the areas targeted by this assay, and inadequate number of viral copies(<138 copies/mL). A negative result must be combined with clinical observations, patient history, and  epidemiological information. The expected result is Negative.  Fact Sheet for Patients:  BloggerCourse.com  Fact Sheet for Healthcare Providers:  SeriousBroker.it  This test is no t yet approved or cleared by the [place] FDA and  has been authorized for detection and/or diagnosis of SARS-CoV-2 by FDA under an Emergency Use Authorization (EUA). This EUA will remain  in effect (meaning this test can be used) for the duration of the COVID-19 declaration under Section 564(b)(1) of the Act, 21 U.S.C.section 360bbb-3(b)(1), unless the authorization is terminated  or revoked sooner.       Influenza A by PCR NEGATIVE NEGATIVE Final   Influenza B by PCR NEGATIVE NEGATIVE Final    Comment: (NOTE) The Xpert Xpress SARS-CoV-2/FLU/RSV plus assay is intended as an aid in the diagnosis of influenza from Nasopharyngeal swab specimens and should not be used as a sole basis for treatment. Nasal washings and aspirates are unacceptable for Xpert Xpress SARS-CoV-2/FLU/RSV testing.  Fact Sheet for Patients: BloggerCourse.com  Fact Sheet for Healthcare Providers: SeriousBroker.it  This test is not yet approved or cleared by the [place] FDA and has been authorized for detection and/or diagnosis of SARS-CoV-2 by FDA under an Emergency Use Authorization (EUA). This EUA will remain in effect (meaning this test can be used) for the duration of the COVID-19 declaration under Section 564(b)(1) of the Act, 21 U.S.C. section 360bbb-3(b)(1), unless the authorization is terminated or revoked.  Performed at [facility], [address], [place], [place] [address]     RN Pressure Injury Documentation:     Estimated  body mass index is 33.07 kg/m as calculated from the following:   Height as of this encounter: 5' (1.524 m).   Weight as of this encounter: 76.8  kg.  Malnutrition Type:    Malnutrition Characteristics:    Nutrition Interventions:    Radiology Studies: No results found. Scheduled Meds: . atorvastatin  40 mg Oral QHS  . enoxaparin (LOVENOX) injection  40 mg Subcutaneous Q24H  . insulin aspart  0-9 Units Subcutaneous TID WC  . insulin glargine  10 Units Subcutaneous QHS  . isosorbide mononitrate  10 mg Oral BID  . levothyroxine  50 mcg Oral QAC breakfast  . lisinopril  20 mg Oral Daily  . potassium chloride  40 mEq Oral BID  . verapamil  240 mg Oral Daily   Continuous Infusions:   LOS: 1 day   Merlene Laughter, DO [facility] PAGER is on AMION  If 7PM-7AM, please contact night-coverage [email or web address]

## 2020-09-03 DIAGNOSIS — R55 Syncope and collapse: Secondary | ICD-10-CM | POA: Diagnosis not present

## 2020-09-03 DIAGNOSIS — M2550 Pain in unspecified joint: Secondary | ICD-10-CM | POA: Diagnosis not present

## 2020-09-03 DIAGNOSIS — I639 Cerebral infarction, unspecified: Secondary | ICD-10-CM | POA: Diagnosis not present

## 2020-09-03 DIAGNOSIS — W19XXXS Unspecified fall, sequela: Secondary | ICD-10-CM | POA: Diagnosis not present

## 2020-09-03 DIAGNOSIS — D649 Anemia, unspecified: Secondary | ICD-10-CM | POA: Diagnosis not present

## 2020-09-03 DIAGNOSIS — Z794 Long term (current) use of insulin: Secondary | ICD-10-CM | POA: Diagnosis not present

## 2020-09-03 DIAGNOSIS — E039 Hypothyroidism, unspecified: Secondary | ICD-10-CM | POA: Diagnosis not present

## 2020-09-03 DIAGNOSIS — R5381 Other malaise: Secondary | ICD-10-CM | POA: Diagnosis not present

## 2020-09-03 DIAGNOSIS — R42 Dizziness and giddiness: Secondary | ICD-10-CM | POA: Diagnosis not present

## 2020-09-03 DIAGNOSIS — I10 Essential (primary) hypertension: Secondary | ICD-10-CM | POA: Diagnosis not present

## 2020-09-03 DIAGNOSIS — I951 Orthostatic hypotension: Secondary | ICD-10-CM | POA: Diagnosis not present

## 2020-09-03 DIAGNOSIS — R262 Difficulty in walking, not elsewhere classified: Secondary | ICD-10-CM | POA: Diagnosis not present

## 2020-09-03 DIAGNOSIS — I6789 Other cerebrovascular disease: Secondary | ICD-10-CM | POA: Diagnosis not present

## 2020-09-03 DIAGNOSIS — E119 Type 2 diabetes mellitus without complications: Secondary | ICD-10-CM | POA: Diagnosis not present

## 2020-09-03 DIAGNOSIS — Z7401 Bed confinement status: Secondary | ICD-10-CM | POA: Diagnosis not present

## 2020-09-03 LAB — RESP PANEL BY RT-PCR (FLU A&B, COVID) ARPGX2
Influenza A by PCR: NEGATIVE
Influenza B by PCR: NEGATIVE
SARS Coronavirus 2 by RT PCR: NEGATIVE

## 2020-09-03 LAB — CBC WITH DIFFERENTIAL/PLATELET
Abs Immature Granulocytes: 0.03 10*3/uL (ref 0.00–0.07)
Basophils Absolute: 0.1 10*3/uL (ref 0.0–0.1)
Basophils Relative: 1 %
Eosinophils Absolute: 0.3 10*3/uL (ref 0.0–0.5)
Eosinophils Relative: 4 %
HCT: 33.2 % — ABNORMAL LOW (ref 36.0–46.0)
Hemoglobin: 10.6 g/dL — ABNORMAL LOW (ref 12.0–15.0)
Immature Granulocytes: 0 %
Lymphocytes Relative: 24 %
Lymphs Abs: 2 10*3/uL (ref 0.7–4.0)
MCH: 29.2 pg (ref 26.0–34.0)
MCHC: 31.9 g/dL (ref 30.0–36.0)
MCV: 91.5 fL (ref 80.0–100.0)
Monocytes Absolute: 0.8 10*3/uL (ref 0.1–1.0)
Monocytes Relative: 9 %
Neutro Abs: 4.9 10*3/uL (ref 1.7–7.7)
Neutrophils Relative %: 62 %
Platelets: 175 10*3/uL (ref 150–400)
RBC: 3.63 MIL/uL — ABNORMAL LOW (ref 3.87–5.11)
RDW: 15.6 % — ABNORMAL HIGH (ref 11.5–15.5)
WBC: 8 10*3/uL (ref 4.0–10.5)
nRBC: 0 % (ref 0.0–0.2)

## 2020-09-03 LAB — GLUCOSE, CAPILLARY
Glucose-Capillary: 87 mg/dL (ref 70–99)
Glucose-Capillary: 122 mg/dL — ABNORMAL HIGH (ref 70–99)

## 2020-09-03 LAB — PHOSPHORUS: Phosphorus: 3.7 mg/dL (ref 2.5–4.6)

## 2020-09-03 LAB — BASIC METABOLIC PANEL
Anion gap: 9 (ref 5–15)
BUN: 15 mg/dL (ref 8–23)
CO2: 23 mmol/L (ref 22–32)
Calcium: 9 mg/dL (ref 8.9–10.3)
Chloride: 107 mmol/L (ref 98–111)
Creatinine, Ser: 0.69 mg/dL (ref 0.44–1.00)
GFR, Estimated: >60 mL/min (ref >60)
Glucose, Bld: 99 mg/dL (ref 70–99)
Potassium: 4 mmol/L (ref 3.5–5.1)
Sodium: 139 mmol/L (ref 135–145)

## 2020-09-03 LAB — MAGNESIUM: Magnesium: 1.6 mg/dL — ABNORMAL LOW (ref 1.7–2.4)

## 2020-09-03 MED ORDER — MAGNESIUM SULFATE 2 GM/50ML IV SOLN
2.0000 g | Freq: Once | INTRAVENOUS | Status: AC
  Administered 2020-09-03: 2 g via INTRAVENOUS

## 2020-09-03 MED ORDER — NOVOLOG MIX 70/30 FLEXPEN (70-30) 100 UNIT/ML ~~LOC~~ SUPN: 10.0000 [IU] | PEN_INJECTOR | Freq: Two times a day (BID) | SUBCUTANEOUS | 11 refills | Status: DC

## 2020-09-03 MED ORDER — LISINOPRIL 20 MG PO TABS
20.0000 mg | ORAL_TABLET | Freq: Every day | ORAL | 0 refills | Status: DC
Start: 2020-09-04 — End: 2023-07-06

## 2020-09-03 MED ORDER — MAGNESIUM SULFATE 2 GM/50ML IV SOLN
2.0000 g | Freq: Once | INTRAVENOUS | Status: DC
  Filled 2020-09-03 (×2): qty 50

## 2020-09-03 NOTE — Progress Notes (Signed)
Occupational Therapy Progress Note  Patient min G assist for functional ambulation to/from bathroom, demonstrates good safety awareness with hand placement with sit <> stand. Patient supervision to stand sink side for g/h. Continues to demonstrate good rehab potential.    09/03/20 1300  OT Visit Information  Last OT Received On 09/03/20  Assistance Needed +1  History of Present Illness 86 y.o. female presenting with generalized weakness, fall and near syncope with concern for TIA vs. orthostatic hypotension. MRI (-) for acute findings but shows chronic cerebellar strokes. PMHx significant for Dm, HTN, and hypothyroidism.   Precautions  Precautions Fall  Precaution Comments High Fall Risk, HOH  Pain Assessment  Pain Assessment No/denies pain  Cognition  Arousal/Alertness Awake/alert  Behavior During Therapy WFL for tasks assessed/performed  Overall Cognitive Status No family/caregiver present to determine baseline cognitive functioning  General Comments following directions appropriately  ADL  Overall ADL's  Needs assistance/impaired  Grooming Oral care;Wash/dry face;Wash/dry hands;Supervision/safety;Standing  Psychologist, clinical guard;Ambulation;RW;Grab Forensic psychologist Details (indicate cue type and reason) demonstrates good safety awareness using grab bar to stabilize for transfer   Toileting- Clothing Manipulation and Hygiene Sitting/lateral lean;Supervision/safety  Functional mobility during ADLs Min guard;Rolling walker  Bed Mobility  General bed mobility comments in chair   Balance  Overall balance assessment Needs assistance  Sitting-balance support Feet supported  Sitting balance-Leahy Scale Fair  Standing balance support Bilateral upper extremity supported  Standing balance-Leahy Scale Poor  Standing balance comment reliant on B UE support  Transfers  Overall transfer level Needs assistance  Equipment used Rolling walker (2 wheeled)  Transfers Sit  to/from Stand  Sit to Stand Min guard  General transfer comment patient require two attempts to power up to standing from toilet, min G for safety  OT - End of Session  Equipment Utilized During Treatment Rolling walker  Activity Tolerance Patient tolerated treatment well  Patient left in chair;with call bell/phone within reach;with chair alarm set  Nurse Communication Mobility status  OT Assessment/Plan  OT Plan Discharge plan remains appropriate  OT Visit Diagnosis Unsteadiness on feet (R26.81);Muscle weakness (generalized) (M62.81);History of falling (Z91.81);Dizziness and giddiness (R42);Pain  Pain - Right/Left Right  Pain - part of body Shoulder  OT Frequency (ACUTE ONLY) Min 2X/week  Follow Up Recommendations SNF  OT Equipment None recommended by OT  AM-PAC OT "6 Clicks" Daily Activity Outcome Measure (Version 2)  Help from another person eating meals? 3  Help from another person taking care of personal grooming? 3  Help from another person toileting, which includes using toliet, bedpan, or urinal? 3  Help from another person bathing (including washing, rinsing, drying)? 2  Help from another person to put on and taking off regular upper body clothing? 3  Help from another person to put on and taking off regular lower body clothing? 2  6 Click Score 16  OT Goal Progression  Progress towards OT goals Progressing toward goals  Acute Rehab OT Goals  Patient Stated Goal To get stronger  OT Goal Formulation With patient  Time For Goal Achievement 09/14/20  Potential to Achieve Goals Good  ADL Goals  Pt Will Perform Grooming with set-up;sitting  Pt Will Perform Upper Body Dressing with set-up;sitting  Pt Will Perform Lower Body Dressing with min assist;sit to/from stand;with adaptive equipment  Pt Will Transfer to Toilet with supervision;ambulating;bedside commode  Pt Will Perform Toileting - Clothing Manipulation and hygiene with supervision;sit to/from stand;sitting/lateral leans   Additional ADL Goal #1 Patient will demonstrate  ability for day to day recall/carry over during ADLs with supervision A.  OT Time Calculation  OT Start Time (ACUTE ONLY) 1318  OT Stop Time (ACUTE ONLY) 1335  OT Time Calculation (min) 17 min  OT General Charges  $OT Visit 1 Visit  OT Treatments  $Self Care/Home Management  8-22 mins   Marlyce Huge OT OT pager: [phone]

## 2020-09-03 NOTE — Progress Notes (Signed)
Report given to [place] at [place].  PIV discontinued per policy and AVS educated to patient.  Daughter notified of DC.

## 2020-09-03 NOTE — Discharge Summary (Signed)
Physician Discharge Summary  Krisalyn Yankowski Edmondson VQX:[id number] DOB: [birth year] DOA: 08/30/2020  PCP: Benito Mccreedy, MD  Admit date: 08/30/2020 Discharge date: 09/03/2020  Admitted From: Home Disposition: SNF  Recommendations for Outpatient Follow-up:  1. Follow up with PCP in 1-2 weeks 2. Please obtain CMP/CBC, Mag, Phos in one week 3. Please follow up on the following pending results:  Home Health: No Equipment/Devices: None    Discharge Condition: Stable  CODE STATUS: FULL CODE Diet recommendation: Heart Healthy  Brief/Interim Summary: HPI per Dr. Marva Panda on 08/30/20 This is a 86 year old female with past medical history of diabetes, hypertension, hypothyroidism who presented to the ED after feeling lightheadedness fallingwhen waking up this morning. Patient fell back onto her bed when getting out of bedand was unable to get up. Had generalized weakness.She lives by herself at home and called EMS to help her get up. No slurred speech, no focal weakness otherwise, no visual changes, no headache no chest pain or shortness of breath, fever or chills or any other issues. Had an episode like this in the past patient was admitted for presyncopal episode back in March had nausea and vomiting at that time that was thought to be related to hyperglycemia and dehydration.   ED Course:Afebrile, hemodynamically stable, on room air.Labs overall unremarkable. Notable Imaging:CT head concerning for possible acute/subacute left cerebellar infarct with additional remote appearing bilateral cerebellar infarcts.. Patient receivedAntivert.  **Interim History  Dizziness has improving but she was nauseous yesterday and not today.  IV fluids were continued until today and this was discontinued now and repeat orthostatics done today show that she did not drop.  We will order TED hose and continue monitor carefully.  We will discontinue her hydrochlorothiazide.  Currently she is medically  stable to be discharged to skilled nursing facility and will be discharged now that bed is available.   Discharge Diagnoses:  Principal Problem:   Near syncope Active Problems:   Type 2 diabetes mellitus without complication (HCC)   Hypothyroidism   Dizziness   Orthostatic dizziness  Acute presyncopal episode likely orthostatic in nature, improved -Patient remarks on poor p.o. intake in the past few weeks with diminished appetite; she had some nausea earlier this morning but no actual vomiting; nausea was short-lived and she is no longer nauseous today -IV fluid hydration was continued but will reduce to 75 MLS per hour and will stop IV fluid hydration today -She does note chronic issues with "dizziness" when standing up too quickly; was not dizzy today -We discussed possible need for reduction of home antihypertensives and now I have stopped her hydrochlorothiazide -We will order TED hose and repeat orthostatics in the a.m; she is not orthostatic on this morning's vital sign check. -PT OT following, currently recommending SNF -She is medically stable to be discharged to SNF and Insurance Authorization has been obtained  Acute CVA ruled out -Imaging including CT, MRI, carotid ultrasounds were negative for any acute findings or infarcts -Remote strokes were noted on imaging but again nothing acute -PT OT following given above per protocol -Currently recommending SNF placement due to ambulatory dysfunction; she is medically stable to be discharged to SNF but now needs insurance authorization  Hypothyroidism -TSH within normal limits -C/w Levothyroxine 50 mcg po Daily   Hypertension, essential -Continue home medications but will discontinue HCTZ 25 mg Daily heart orthostasis -C/w Lisinopril 20 mg po daily and Verapamil 240 mg po Daily and continue with isosorbide mononitrate 10 mg p.o. twice daily -Blood pressure was 131/83 -  We will add hydralazine 10 mg every 6 hours as needed for  systolic blood pressure greater than 765 or diastolic blood pressure in 100 -Continue monitor blood pressures per protocol  HLD -Total cholesterol/HDL ratio was 2.5, cholesterol was 170, HDL was 69, LDL was 93, triglycerides was 39, VLDL was 8 -C/w Atorvastatin 40 mg po qHS  Insulin-dependent diabetes type 2, moderately well controlled -Recent HbA1c was 7.5 on 08/30/20 -CBG'sr ranging from 71-101; Blood Sugar on CMP was 89 this AM -C/w Lantus 10 units sq qHS and Sensitive Novolog SSI AC  Normocytic Anemia -Patient's Hgb/Hct went from 11.8/36.7 -> 10.6/32.9 -> 10.7/32.4 -> 10.6/33.2 and stable -? Dilutional Drop -Check Anemia Panel as an outpatient  -Continue to Monitor for S/Sx of [place]; currently no overt bleeding noted -Repeat CBC within 1 week  Hypokalemia -Mild -Patient's potassium this morning was 4.0 -Magnesium level was slightly lower at 1.6 -Continue to Monitor and Replete as necessary -Repeat CMP within 1 week  Hypomagnesemia -Patient's Mag Level this AM was 1.6 -Replete with IV Mag Sulfate 2 grams -Continue to Monitor and Replete as Necessary -Repeat Mag Level within 1 week  Obesity -Complicates overall prognosis and care -Estimated body mass index is 33.07 kg/m as calculated from the following:   Height as of this encounter: 5' (1.524 m).   Weight as of this encounter: 76.8 kg. -Weight Loss and Dietary Counseling given   Discharge Instructions  Discharge Instructions    Call MD for:  difficulty breathing, headache or visual disturbances   Complete by: As directed    Call MD for:  extreme fatigue   Complete by: As directed    Call MD for:  hives   Complete by: As directed    Call MD for:  persistant dizziness or light-headedness   Complete by: As directed    Call MD for:  persistant nausea and vomiting   Complete by: As directed    Call MD for:  redness, tenderness, or signs of infection (pain, swelling, redness, odor or green/yellow discharge  around incision site)   Complete by: As directed    Call MD for:  severe uncontrolled pain   Complete by: As directed    Call MD for:  temperature >100.4   Complete by: As directed    Diet - low sodium heart healthy   Complete by: As directed    Diet Carb Modified   Complete by: As directed    Discharge instructions   Complete by: As directed    You were cared for by a hospitalist during your hospital stay. If you have any questions about your discharge medications or the care you received while you were in the hospital after you are discharged, you can call the unit and ask to speak with the hospitalist on call if the hospitalist that took care of you is not available. Once you are discharged, your primary care physician will handle any further medical issues. Please note that NO REFILLS for any discharge medications will be authorized once you are discharged, as it is imperative that you return to your primary care physician (or establish a relationship with a primary care physician if you do not have one) for your aftercare needs so that they can reassess your need for medications and monitor your lab values.  Follow up with PCP within 1-2 weeks. Take all medications as prescribed. If symptoms change or worsen please return to the ED for evaluation   Increase activity slowly   Complete by:  As directed      Allergies as of 09/03/2020      Reactions   Tape Other (See Comments)   SKIN WILL TEAR EASILY!!   Aspirin Palpitations, Other (See Comments)   Heart flutters      Medication List    STOP taking these medications   lisinopril-hydrochlorothiazide 20-25 MG tablet Commonly known as: ZESTORETIC     TAKE these medications   Accu-Chek FastClix Lancets Misc   Accu-Chek Guide test strip Generic drug: glucose blood 1 each by Other route. 2-3   Accu-Chek Guide w/Device Kit   Alcohol Prep Pads   atorvastatin 40 MG tablet Commonly known as: LIPITOR Take 40 mg by mouth at  bedtime.   B-D UF III MINI PEN NEEDLES 31G X 5 MM Misc Generic drug: Insulin Pen Needle See admin instructions.   BD Insulin Syringe U/F 31G X 5/16" 0.5 ML Misc Generic drug: Insulin Syringe-Needle U-100   Cleansing Cloths Flushable Misc   isosorbide mononitrate 10 MG tablet Commonly known as: ISMO Take 10 mg by mouth 2 (two) times daily.   levothyroxine 50 MCG tablet Commonly known as: SYNTHROID Take 50 mcg by mouth daily before breakfast.   lisinopril 20 MG tablet Commonly known as: ZESTRIL Take 1 tablet (20 mg total) by mouth daily. Start taking on: September 04, 2020   nitroGLYCERIN 0.4 MG SL tablet Commonly known as: NITROSTAT Place 0.4 mg under the tongue every 5 (five) minutes as needed for chest pain.   NovoLOG Mix 70/30 FlexPen (70-30) 100 UNIT/ML FlexPen Generic drug: insulin aspart protamine - aspart Inject 0.1 mLs (10 Units total) into the skin 2 (two) times daily with a meal. What changed: how much to take   Olopatadine HCl 0.2 % Soln Place 1 drop into both eyes daily.   UNABLE TO FIND Disposable Underwear Ultra-L   verapamil 240 MG CR tablet Commonly known as: CALAN-SR Take 240 mg by mouth daily.       Contact information for after-discharge care    Destination    [facility] .   Service: Skilled Nursing Contact information: [address] [phone]                 Allergies  Allergen Reactions  . Tape Other (See Comments)    SKIN WILL TEAR EASILY!!  . Aspirin Palpitations and Other (See Comments)    Heart flutters   Consultations:  None  Procedures/Studies: CT HEAD WO CONTRAST  Result Date: 08/30/2020 CLINICAL DATA:  Focal neuro deficit, acute stroke suspected. Dizziness. EXAM: CT HEAD WITHOUT CONTRAST TECHNIQUE: Contiguous axial images were obtained from the base of the skull through the vertex without intravenous contrast. COMPARISON:  None. FINDINGS: Brain: No evidence of acute  hemorrhage, hydrocephalus, extra-axial collection or mass lesion/mass effect. Age indeterminate left cerebellar hemisphere infarct (see series 2, image 8 and series 6, image 39), possibly acute/subacute. There are additional remote appearing bilateral cerebellar infarcts, largest on the right. Advanced confluent white matter hypoattenuation bilaterally, compatible with chronic microvascular ischemic disease. Mild diffuse cerebral volume loss with ex vacuo ventricular dilation. No acute hemorrhage. No hydrocephalus. No mass lesion or abnormal mass effect. Vascular: Calcific atherosclerosis. No definite hyperdense vessel identified. Skull: No acute fracture. Left frontal outer calvarial table hyperostosis. Sinuses/Orbits: Visualized sinuses are clear.  Unremarkable orbits. Other: No mastoid effusions. IMPRESSION: 1. Age indeterminate, possibly acute/subacute left cerebellar infarct. Recommend MRI to further evaluate. 2. There are additional remote appearing bilateral cerebellar infarcts. 3. Advanced  confluent chronic microvascular ischemic disease. Electronically Signed   By: Margaretha Sheffield MD   On: 08/30/2020 10:45   MR BRAIN WO CONTRAST  Result Date: 08/30/2020 CLINICAL DATA:  Dizziness EXAM: MRI HEAD WITHOUT CONTRAST TECHNIQUE: Multiplanar, multiecho pulse sequences of the brain and surrounding structures were obtained without intravenous contrast. COMPARISON:  None. FINDINGS: Brain: There is no acute infarction or intracranial hemorrhage. Prominence of the ventricles and sulci reflects generalized parenchymal volume loss. Confluent areas of T2 hyperintensity in the supratentorial and pontine white matter nonspecific but probably reflect advanced chronic microvascular ischemic changes. There are chronic small vessel infarcts of the central gray nuclei and bilateral cerebellum. Punctate focus of susceptibility in the left corona radiata likely reflects a chronic microhemorrhage. There is no intracranial  mass, mass effect, or edema. There is no hydrocephalus or extra-axial fluid collection. Vascular: Major vessel flow voids at the skull base are preserved. Skull and upper cervical spine: Marrow signal is overall preserved. There are advanced degenerative changes at C3-C4. Sinuses/Orbits: Paranasal sinuses are aerated. Orbits are unremarkable. Other: Sella is unremarkable.  Mastoid air cells are clear. IMPRESSION: No evidence of recent infarction, hemorrhage, or mass. Advanced chronic microvascular ischemic changes. Chronic small vessel infarcts. Electronically Signed   By: Macy Mis M.D.   On: 08/30/2020 14:19   ECHOCARDIOGRAM COMPLETE  Result Date: 08/31/2020    ECHOCARDIOGRAM REPORT   Patient Name:   NOVA SCHMUHL Date of Exam: 08/31/2020 Medical Rec #:  [id number]       Height:       60.0 in Accession #:    [id number]      Weight:       169.3 lb Date of Birth:  [birth year]      BSA:          1.739 m Patient Age:    39 years        BP:           127/77 mmHg Patient Gender: F               HR:           74 bpm. Exam Location:  Inpatient Procedure: 2D Echo, Color Doppler and Cardiac Doppler Indications:    Stroke 434.91 / I163.9  History:        Patient has no prior history of Echocardiogram examinations.                 Risk Factors:Diabetes and Hypertension.  Sonographer:    Bernadene Person RDCS Referring Phys: [id number] [place]  1. Left ventricular ejection fraction, by estimation, is 55 to 60%. The left ventricle has normal function. The left ventricle has no regional wall motion abnormalities. There is mild asymmetric left ventricular hypertrophy of the basal-septal segment. Left ventricular diastolic parameters are indeterminate.  2. Right ventricular systolic function is normal. The right ventricular size is normal. There is moderately elevated pulmonary artery systolic pressure.  3. Left atrial size was mildly dilated.  4. The mitral valve is normal in structure. Mild mitral valve  regurgitation. No evidence of mitral stenosis.  5. Tricuspid valve regurgitation is moderate.  6. The aortic valve is tricuspid. There is mild calcification of the aortic valve. Aortic valve regurgitation is not visualized. Mild aortic valve sclerosis is present, with no evidence of aortic valve stenosis.  7. The inferior vena cava is normal in size with greater than 50% respiratory variability, suggesting right atrial pressure of 3 mmHg. Comparison(s): No prior  Echocardiogram. Conclusion(s)/Recommendation(s): Otherwise normal echocardiogram, with minor abnormalities described in the report. No intracardiac source of embolism detected on this transthoracic study. A transesophageal echocardiogram is recommended to exclude cardiac source of embolism if clinically indicated. FINDINGS  Left Ventricle: Left ventricular ejection fraction, by estimation, is 55 to 60%. The left ventricle has normal function. The left ventricle has no regional wall motion abnormalities. The left ventricular internal cavity size was normal in size. There is  mild asymmetric left ventricular hypertrophy of the basal-septal segment. Left ventricular diastolic parameters are indeterminate. Right Ventricle: The right ventricular size is normal. No increase in right ventricular wall thickness. Right ventricular systolic function is normal. There is moderately elevated pulmonary artery systolic pressure. The tricuspid regurgitant velocity is 3.52 m/s, and with an assumed right atrial pressure of 3 mmHg, the estimated right ventricular systolic pressure is 14.4 mmHg. Left Atrium: Left atrial size was mildly dilated. Right Atrium: Right atrial size was normal in size. Pericardium: There is no evidence of pericardial effusion. Mitral Valve: The mitral valve is normal in structure. Mild to moderate mitral annular calcification. Mild mitral valve regurgitation. No evidence of mitral valve stenosis. Tricuspid Valve: The tricuspid valve is normal in  structure. Tricuspid valve regurgitation is moderate. Aortic Valve: The aortic valve is tricuspid. There is mild calcification of the aortic valve. Aortic valve regurgitation is not visualized. Mild aortic valve sclerosis is present, with no evidence of aortic valve stenosis. Pulmonic Valve: The pulmonic valve was not well visualized. Pulmonic valve regurgitation is trivial. No evidence of pulmonic stenosis. Aorta: The aortic root, ascending aorta and aortic arch are all structurally normal, with no evidence of dilitation or obstruction. Venous: The inferior vena cava is normal in size with greater than 50% respiratory variability, suggesting right atrial pressure of 3 mmHg. IAS/Shunts: No atrial level shunt detected by color flow Doppler.  LEFT VENTRICLE PLAX 2D LVIDd:         3.80 cm  Diastology LVIDs:         2.63 cm  LV e' medial:    7.83 cm/s LV PW:         0.90 cm  LV E/e' medial:  11.6 LV IVS:        0.70 cm  LV e' lateral:   9.68 cm/s LVOT diam:     2.00 cm  LV E/e' lateral: 9.4 LV SV:         76 LV SV Index:   44 LVOT Area:     3.14 cm  RIGHT VENTRICLE RV S prime:     18.20 cm/s TAPSE (M-mode): 2.0 cm LEFT ATRIUM             Index       RIGHT ATRIUM           Index LA diam:        3.20 cm 1.84 cm/m  RA Area:     11.60 cm LA Vol (A2C):   47.0 ml 27.03 ml/m RA Volume:   24.30 ml  13.97 ml/m LA Vol (A4C):   52.3 ml 30.07 ml/m LA Biplane Vol: 50.6 ml 29.10 ml/m  AORTIC VALVE LVOT Vmax:   88.90 cm/s LVOT Vmean:  62.800 cm/s LVOT VTI:    0.243 m  AORTA Ao Root diam: 3.00 cm Ao Asc diam:  3.30 cm MITRAL VALVE                TRICUSPID VALVE MV Area (PHT): 2.56 cm     TR  Peak grad:   49.6 mmHg MV Decel Time: 296 msec     TR Vmax:        352.00 cm/s MR PISA:        0.57 cm MR PISA Radius: 0.30 cm     SHUNTS MV E velocity: 91.20 cm/s   Systemic VTI:  0.24 m MV A velocity: 110.00 cm/s  Systemic Diam: 2.00 cm MV E/A ratio:  0.83 Buford Dresser MD Electronically signed by Buford Dresser MD  Signature Date/Time: 08/31/2020/2:09:19 PM    Final    VAS US CAROTID  Result Date: 08/30/2020 Carotid Arterial Duplex Study Indications:       TIA. Risk Factors:      None. Comparison Study:  No prior studies. Performing Technologist: Oliver Hum RVT  Examination Guidelines: A complete evaluation includes B-mode imaging, spectral Doppler, color Doppler, and power Doppler as needed of all accessible portions of each vessel. Bilateral testing is considered an integral part of a complete examination. Limited examinations for reoccurring indications may be performed as noted.  Right Carotid Findings: +----------+--------+--------+--------+-----------------------+--------+           PSV cm/sEDV cm/sStenosisPlaque Description     Comments +----------+--------+--------+--------+-----------------------+--------+ CCA Prox  73      12              smooth and heterogenous         +----------+--------+--------+--------+-----------------------+--------+ CCA Distal43      10              smooth and heterogenous         +----------+--------+--------+--------+-----------------------+--------+ ICA Prox  52      8               calcific                        +----------+--------+--------+--------+-----------------------+--------+ ICA Distal65      20                                     tortuous +----------+--------+--------+--------+-----------------------+--------+ ECA       60      4                                               +----------+--------+--------+--------+-----------------------+--------+ +----------+--------+-------+--------+-------------------+           PSV cm/sEDV cmsDescribeArm Pressure (mmHG) +----------+--------+-------+--------+-------------------+ [id number]                                        +----------+--------+-------+--------+-------------------+ +---------+--------+--+--------+--+---------+ VertebralPSV cm/s48EDV cm/s10Antegrade  +---------+--------+--+--------+--+---------+  Left Carotid Findings: +----------+--------+--------+--------+-----------------------+--------+           PSV cm/sEDV cm/sStenosisPlaque Description     Comments +----------+--------+--------+--------+-----------------------+--------+ CCA Prox  117     17              smooth and heterogenoustortuous +----------+--------+--------+--------+-----------------------+--------+ CCA Distal35      9               smooth and heterogenous         +----------+--------+--------+--------+-----------------------+--------+ ICA Prox  40      12  smooth and heterogenoustortuous +----------+--------+--------+--------+-----------------------+--------+ ICA Distal34      10                                     tortuous +----------+--------+--------+--------+-----------------------+--------+ ECA       51      4                                               +----------+--------+--------+--------+-----------------------+--------+ +----------+--------+--------+--------+-------------------+           PSV cm/sEDV cm/sDescribeArm Pressure (mmHG) +----------+--------+--------+--------+-------------------+ [id number]                                         +----------+--------+--------+--------+-------------------+ +---------+--------+--+--------+-+---------+ VertebralPSV cm/s40EDV cm/s9Antegrade +---------+--------+--+--------+-+---------+   Summary: Right Carotid: Velocities in the right ICA are consistent with a 1-39% stenosis. Left Carotid: Velocities in the left ICA are consistent with a 1-39% stenosis. Vertebrals: Bilateral vertebral arteries demonstrate antegrade flow. *See table(s) above for measurements and observations.  Electronically signed by Ruta Hinds MD on 08/30/2020 at 3:54:16 PM.    Final     Subjective: Seen and examined at bedside and she is doing well.  Had no complaints.  Dizziness has resolved.   Stable to be discharged and felt well and was tolerating her diet without issues.  Discharge Exam: Vitals:   09/02/20 1958 09/03/20 0536  BP: (!) 145/79 131/83  Pulse: 77 72  Resp: 16 16  Temp: 98.3 F (36.8 C) 98.3 F (36.8 C)  SpO2: 95% 97%   Vitals:   09/02/20 1404 09/02/20 1647 09/02/20 1958 09/03/20 0536  BP: 136/64  (!) 145/79 131/83  Pulse: 71  77 72  Resp: (!) _0 Temp: 98.6 F (37 C)  98.3 F (36.8 C) 98.3 F (36.8 C)  TempSrc: Oral  Oral Oral  SpO2: 96%  95% 97%  Weight:      Height:       General: Pt is alert, awake, not in acute distress Cardiovascular: RRR, S1/S2 +, no rubs, no gallops Respiratory: Diminished bilaterally, no wheezing, no rhonchi; Unlabored Breathing  Abdominal: Soft, NT, Distended 2/2 body habitus, bowel sounds + Extremities: no edema, no cyanosis  The results of significant diagnostics from this hospitalization (including imaging, microbiology, ancillary and laboratory) are listed below for reference.    Microbiology: Recent Results (from the past 240 hour(s))  Resp Panel by RT-PCR (Flu A&B, Covid) Nasopharyngeal Swab     Status: None   Collection Time: 08/30/20 12:58 PM   Specimen: Nasopharyngeal Swab; Nasopharyngeal(NP) swabs in vial transport medium  Result Value Ref Range Status   SARS Coronavirus 2 by RT PCR NEGATIVE NEGATIVE Final    Comment: (NOTE) SARS-CoV-2 target nucleic acids are NOT DETECTED.  The SARS-CoV-2 RNA is generally detectable in upper respiratory specimens during the acute phase of infection. The lowest concentration of SARS-CoV-2 viral copies this assay can detect is 138 copies/mL. A negative result does not preclude SARS-Cov-2 infection and should not be used as the sole basis for treatment or other patient management decisions. A negative result may occur with  improper specimen collection/handling, submission of specimen other than nasopharyngeal swab, presence of viral mutation(s) within the areas  targeted by this assay, and inadequate number of viral copies(<138 copies/mL). A negative result must be combined with clinical observations, patient history, and epidemiological information. The expected result is Negative.  Fact Sheet for Patients:  EntrepreneurPulse.com.au  Fact Sheet for Healthcare Providers:  IncredibleEmployment.be  This test is no t yet approved or cleared by the [place] FDA and  has been authorized for detection and/or diagnosis of SARS-CoV-2 by FDA under an Emergency Use Authorization (EUA). This EUA will remain  in effect (meaning this test can be used) for the duration of the COVID-19 declaration under Section 564(b)(1) of the Act, 21 U.S.C.section 360bbb-3(b)(1), unless the authorization is terminated  or revoked sooner.       Influenza A by PCR NEGATIVE NEGATIVE Final   Influenza B by PCR NEGATIVE NEGATIVE Final    Comment: (NOTE) The Xpert Xpress SARS-CoV-2/FLU/RSV plus assay is intended as an aid in the diagnosis of influenza from Nasopharyngeal swab specimens and should not be used as a sole basis for treatment. Nasal washings and aspirates are unacceptable for Xpert Xpress SARS-CoV-2/FLU/RSV testing.  Fact Sheet for Patients: EntrepreneurPulse.com.au  Fact Sheet for Healthcare Providers: IncredibleEmployment.be  This test is not yet approved or cleared by the [place] FDA and has been authorized for detection and/or diagnosis of SARS-CoV-2 by FDA under an Emergency Use Authorization (EUA). This EUA will remain in effect (meaning this test can be used) for the duration of the COVID-19 declaration under Section 564(b)(1) of the Act, 21 U.S.C. section 360bbb-3(b)(1), unless the authorization is terminated or revoked.  Performed at [facility], [place] [address] [place], [place] [address]     Labs: BNP (last 3 results) No results for  input(s): BNP in the last 8760 hours. Basic Metabolic Panel: Recent Labs  Lab 08/30/20 1022 09/01/20 0619 09/02/20 0632 09/03/20 0616  NA 141 138 139 139  K 4.2 3.5 3.4* 4.0  CL 104 105 106 107  CO2 _0 GLUCOSE 144* 120* 86 99  BUN _1 CREATININE 0.90 0.70 0.67 0.69  CALCIUM 9.4 8.6* 8.9 9.0  MG  --   --  1.9 1.6*  PHOS  --   --  3.8 3.7   Liver Function Tests: Recent Labs  Lab 08/30/20 1022 09/02/20 0632  AST 19 23  ALT 16 19  ALKPHOS 67 52  BILITOT 0.5 0.9  PROT 7.6 6.3*  ALBUMIN 4.4 3.6   No results for input(s): LIPASE, AMYLASE in the last 168 hours. No results for input(s): AMMONIA in the last 168 hours. CBC: Recent Labs  Lab 08/30/20 1022 09/01/20 0619 09/02/20 0632 09/03/20 0616  WBC 8.3 7.1 8.3 8.0  NEUTROABS 6.0  --  5.2 4.9  HGB 11.8* 10.6* 10.7* 10.6*  HCT 36.7 32.9* 33.4* 33.2*  MCV 91.8 91.1 91.3 91.5  PLT 195 181 175 175   Cardiac Enzymes: No results for input(s): CKTOTAL, CKMB, CKMBINDEX, TROPONINI in the last 168 hours. BNP: Invalid input(s): POCBNP CBG: Recent Labs  Lab 09/02/20 0728 09/02/20 1141 09/02/20 1649 09/02/20 2129 09/03/20 0801  GLUCAP 89 71 101* 93 87   D-Dimer No results for input(s): DDIMER in the last 72 hours. Hgb A1c No results for input(s): HGBA1C in the last 72 hours. Lipid Profile No results for input(s): CHOL, HDL, LDLCALC, TRIG, CHOLHDL, LDLDIRECT in the last 72 hours. Thyroid function studies No results for input(s): TSH, T4TOTAL, T3FREE, THYROIDAB in the last 72 hours.  Invalid input(s): FREET3  Anemia work up No results for input(s): VITAMINB12, FOLATE, FERRITIN, TIBC, IRON, RETICCTPCT in the last 72 hours. Urinalysis    Component Value Date/Time   COLORURINE YELLOW 08/30/2020 1001   APPEARANCEUR CLEAR 08/30/2020 1001   LABSPEC 1.012 08/30/2020 1001   PHURINE 7.0 08/30/2020 1001   GLUCOSEU NEGATIVE 08/30/2020 1001   HGBUR NEGATIVE 08/30/2020 1001   BILIRUBINUR NEGATIVE  08/30/2020 1001   KETONESUR NEGATIVE 08/30/2020 1001   PROTEINUR NEGATIVE 08/30/2020 1001   UROBILINOGEN 0.2 09/10/2014 1052   NITRITE NEGATIVE 08/30/2020 1001   LEUKOCYTESUR SMALL (A) 08/30/2020 1001   Sepsis Labs Invalid input(s): PROCALCITONIN,  WBC,  LACTICIDVEN Microbiology Recent Results (from the past 240 hour(s))  Resp Panel by RT-PCR (Flu A&B, Covid) Nasopharyngeal Swab     Status: None   Collection Time: 08/30/20 12:58 PM   Specimen: Nasopharyngeal Swab; Nasopharyngeal(NP) swabs in vial transport medium  Result Value Ref Range Status   SARS Coronavirus 2 by RT PCR NEGATIVE NEGATIVE Final    Comment: (NOTE) SARS-CoV-2 target nucleic acids are NOT DETECTED.  The SARS-CoV-2 RNA is generally detectable in upper respiratory specimens during the acute phase of infection. The lowest concentration of SARS-CoV-2 viral copies this assay can detect is 138 copies/mL. A negative result does not preclude SARS-Cov-2 infection and should not be used as the sole basis for treatment or other patient management decisions. A negative result may occur with  improper specimen collection/handling, submission of specimen other than nasopharyngeal swab, presence of viral mutation(s) within the areas targeted by this assay, and inadequate number of viral copies(<138 copies/mL). A negative result must be combined with clinical observations, patient history, and epidemiological information. The expected result is Negative.  Fact Sheet for Patients:  EntrepreneurPulse.com.au  Fact Sheet for Healthcare Providers:  IncredibleEmployment.be  This test is no t yet approved or cleared by the [place] FDA and  has been authorized for detection and/or diagnosis of SARS-CoV-2 by FDA under an Emergency Use Authorization (EUA). This EUA will remain  in effect (meaning this test can be used) for the duration of the COVID-19 declaration under Section 564(b)(1) of the  Act, 21 U.S.C.section 360bbb-3(b)(1), unless the authorization is terminated  or revoked sooner.       Influenza A by PCR NEGATIVE NEGATIVE Final   Influenza B by PCR NEGATIVE NEGATIVE Final    Comment: (NOTE) The Xpert Xpress SARS-CoV-2/FLU/RSV plus assay is intended as an aid in the diagnosis of influenza from Nasopharyngeal swab specimens and should not be used as a sole basis for treatment. Nasal washings and aspirates are unacceptable for Xpert Xpress SARS-CoV-2/FLU/RSV testing.  Fact Sheet for Patients: EntrepreneurPulse.com.au  Fact Sheet for Healthcare Providers: IncredibleEmployment.be  This test is not yet approved or cleared by the [place] FDA and has been authorized for detection and/or diagnosis of SARS-CoV-2 by FDA under an Emergency Use Authorization (EUA). This EUA will remain in effect (meaning this test can be used) for the duration of the COVID-19 declaration under Section 564(b)(1) of the Act, 21 U.S.C. section 360bbb-3(b)(1), unless the authorization is terminated or revoked.  Performed at [facility], [place] [address] [place], [place] [address]    Time coordinating discharge: 35 minutes  SIGNED:  Kerney Elbe, DO [facility] 09/03/2020, 11:30 AM Pager is on AMION  If 7PM-7AM, please contact night-coverage [email or web address]

## 2020-09-03 NOTE — Progress Notes (Signed)
OT Cancellation Note  Patient Details Name: Jennifer Zhang MRN: [id number] DOB: [birth year]   Cancelled Treatment:     Attempt to see patient however working with PT. Will check back as schedule permits.  Marlyce Huge OT OT pager: [phone]   Carmelia Roller 09/03/2020, 12:15 PM

## 2020-09-03 NOTE — Progress Notes (Signed)
Physical Therapy Treatment Patient Details Name: Jennifer Zhang MRN: [id number] DOB: 1925/12/24 Today's Date: 09/03/2020    History of Present Illness 86 y.o. female presenting with generalized weakness, fall and near syncope with concern for TIA vs. orthostatic hypotension. MRI (-) for acute findings but shows chronic cerebellar strokes. PMHx significant for Dm, HTN, and hypothyroidism.     PT Comments    Patient progressing gradually with acute PT and demonstrated improved awareness for use of rollator with gait. She continues to require cues for safety with sue of rollator as a seat and required min assist with turning to sit and for power up to stand. Patient was instructed on LE exercises and completed with cues to use arms for power up but no UE's while sitting to facilitate eccentric lowering. Acute PT will continue to progress pt as able, continue to recommend SNF level follow up for therapy.   Follow Up Recommendations  SNF     Equipment Recommendations  Other (comment) (Rollator (pt's is very old and is bad condition))    Recommendations for Other Services       Precautions / Restrictions Precautions Precautions: Fall Precaution Comments: High Fall Risk, HOH Restrictions Weight Bearing Restrictions: No    Mobility  Bed Mobility Overal bed mobility: Needs Assistance Bed Mobility: Supine to Sit     Supine to sit: Supervision;HOB elevated     General bed mobility comments: Increased time/effort. pt using bed rail to raise trunk and scoot to EOB.   Transfers Overall transfer level: Needs assistance Equipment used: 4-wheeled walker Transfers: Sit to/from Stand Sit to Stand: Min assist         General transfer comment: assist for power up from EOB, rollator seat, and recliner. cues for hand placement as pt tends to pull up on rollator  Ambulation/Gait Ambulation/Gait assistance: Min assist Gait Distance (Feet): 220 Feet Assistive device: 4-wheeled  walker Gait Pattern/deviations: Step-through pattern;Decreased stride length;Shuffle;Trunk flexed Gait velocity: decr   General Gait Details: pt maintained closer proximity to rollator this and requried cues to stay close ~ 25% of time rather than 100% of gait. No overt LOB. VC's requried for safe use of brakes to turn and sit on seat halfway through for seated rest break.    Stairs             Wheelchair Mobility    Modified Rankin (Stroke Patients Only)       Balance Overall balance assessment: Needs assistance Sitting-balance support: Bilateral upper extremity supported;Feet supported Sitting balance-Leahy Scale: Fair     Standing balance support: Bilateral upper extremity supported Standing balance-Leahy Scale: Poor Standing balance comment: Heavy reliance on BUE on RW and external assist.                             Cognition Arousal/Alertness: Awake/alert Behavior During Therapy: WFL for tasks assessed/performed Overall Cognitive Status: No family/caregiver present to determine baseline cognitive functioning                                 General Comments: pt with short term memory deficits. oriented x3 (not time).      Exercises Other Exercises Other Exercises: 20 reps ankle pumps sitting on rollator, 1x 10 reps sit<>stand from EOB.    General Comments        Pertinent Vitals/Pain Pain Assessment: No/denies pain    Home Living  Prior Function            PT Goals (current goals can now be found in the care plan section) Acute Rehab PT Goals Patient Stated Goal: To get stronger PT Goal Formulation: With patient Time For Goal Achievement: 09/14/20 Potential to Achieve Goals: Good Progress towards PT goals: Progressing toward goals    Frequency    Min 2X/week      PT Plan Current plan remains appropriate    Co-evaluation              AM-PAC PT "6 Clicks" Mobility   Outcome  Measure  Help needed turning from your back to your side while in a flat bed without using bedrails?: None Help needed moving from lying on your back to sitting on the side of a flat bed without using bedrails?: A Little Help needed moving to and from a bed to a chair (including a wheelchair)?: A Little Help needed standing up from a chair using your arms (e.g., wheelchair or bedside chair)?: A Little Help needed to walk in hospital room?: A Little Help needed climbing 3-5 steps with a railing? : A Lot 6 Click Score: 18    End of Session Equipment Utilized During Treatment: Gait belt Activity Tolerance: Patient tolerated treatment well Patient left: in chair;with call bell/phone within reach;with chair alarm set Nurse Communication: Mobility status PT Visit Diagnosis: Muscle weakness (generalized) (M62.81);Difficulty in walking, not elsewhere classified (R26.2);Other abnormalities of gait and mobility (R26.89)     Time: 7124-5809 PT Time Calculation (min) (ACUTE ONLY): 24 min  Charges:  $Gait Training: 8-22 mins $Therapeutic Exercise: 8-22 mins                     Wynn Maudlin, DPT Acute Rehabilitation Services  Office [phone] Pager [phone]  09/03/2020 12:45 PM

## 2020-09-03 NOTE — TOC Transition Note (Signed)
Transition of Care [facility]) - CM/SW Discharge Note   Patient Details  Name: Jennifer Zhang MRN: [id number] Date of Birth: 06/19/1926  [facility] of [facility]) CM/SW Contact:  Bartholome Bill, RN Phone Number: 09/03/2020, 11:21 AM   Clinical Narrative:    Lacinda Axon reported that insurance auth was completed and pt could dc there today. DC summary sent via hub. Daughter Jennifer Zhang contacted to inform of dc. [facility] to be contacted for transport. RN to call report to [phone].    Final next level of care: Skilled Nursing Facility Barriers to Discharge: No Barriers Identified   Patient Goals and CMS Choice Patient states their goals for this hospitalization and ongoing recovery are:: To get home      Discharge Placement   Existing PASRR number confirmed : 09/03/20 ([id number])          Patient chooses bed at: [facility] Patient to be transferred to facility by: [facility] Name of family member notified: Daughter Jennifer Zhang Patient and family notified of of transfer: 09/03/20  Discharge Plan and Services   Discharge Planning Services: CM Consult                Social Determinants of Health (SDOH) Interventions     Readmission Risk Interventions No flowsheet data found.

## 2020-09-06 DIAGNOSIS — R262 Difficulty in walking, not elsewhere classified: Secondary | ICD-10-CM | POA: Diagnosis not present

## 2020-09-06 DIAGNOSIS — I951 Orthostatic hypotension: Secondary | ICD-10-CM | POA: Diagnosis not present

## 2020-09-06 DIAGNOSIS — I639 Cerebral infarction, unspecified: Secondary | ICD-10-CM | POA: Diagnosis not present

## 2020-09-06 DIAGNOSIS — I10 Essential (primary) hypertension: Secondary | ICD-10-CM | POA: Diagnosis not present

## 2020-09-17 DIAGNOSIS — R5381 Other malaise: Secondary | ICD-10-CM | POA: Diagnosis not present

## 2020-09-17 DIAGNOSIS — E039 Hypothyroidism, unspecified: Secondary | ICD-10-CM | POA: Diagnosis not present

## 2020-09-17 DIAGNOSIS — I639 Cerebral infarction, unspecified: Secondary | ICD-10-CM | POA: Diagnosis not present

## 2020-09-17 DIAGNOSIS — I10 Essential (primary) hypertension: Secondary | ICD-10-CM | POA: Diagnosis not present

## 2020-09-18 DIAGNOSIS — I10 Essential (primary) hypertension: Secondary | ICD-10-CM | POA: Diagnosis not present

## 2020-09-18 DIAGNOSIS — I6789 Other cerebrovascular disease: Secondary | ICD-10-CM | POA: Diagnosis not present

## 2020-09-18 DIAGNOSIS — E039 Hypothyroidism, unspecified: Secondary | ICD-10-CM | POA: Diagnosis not present

## 2020-09-21 DIAGNOSIS — I10 Essential (primary) hypertension: Secondary | ICD-10-CM | POA: Diagnosis not present

## 2020-09-21 DIAGNOSIS — E1165 Type 2 diabetes mellitus with hyperglycemia: Secondary | ICD-10-CM | POA: Diagnosis not present

## 2020-09-21 DIAGNOSIS — E039 Hypothyroidism, unspecified: Secondary | ICD-10-CM | POA: Diagnosis not present

## 2020-09-21 DIAGNOSIS — I2510 Atherosclerotic heart disease of native coronary artery without angina pectoris: Secondary | ICD-10-CM | POA: Diagnosis not present

## 2020-09-21 DIAGNOSIS — E7800 Pure hypercholesterolemia, unspecified: Secondary | ICD-10-CM | POA: Diagnosis not present

## 2020-09-21 DIAGNOSIS — Z0001 Encounter for general adult medical examination with abnormal findings: Secondary | ICD-10-CM | POA: Diagnosis not present

## 2020-09-21 DIAGNOSIS — M2550 Pain in unspecified joint: Secondary | ICD-10-CM | POA: Diagnosis not present

## 2020-09-22 DIAGNOSIS — E039 Hypothyroidism, unspecified: Secondary | ICD-10-CM | POA: Diagnosis not present

## 2020-09-22 DIAGNOSIS — E119 Type 2 diabetes mellitus without complications: Secondary | ICD-10-CM | POA: Diagnosis not present

## 2020-09-22 DIAGNOSIS — I10 Essential (primary) hypertension: Secondary | ICD-10-CM | POA: Diagnosis not present

## 2020-09-22 DIAGNOSIS — I951 Orthostatic hypotension: Secondary | ICD-10-CM | POA: Diagnosis not present

## 2020-09-22 DIAGNOSIS — I69398 Other sequelae of cerebral infarction: Secondary | ICD-10-CM | POA: Diagnosis not present

## 2020-09-22 DIAGNOSIS — E669 Obesity, unspecified: Secondary | ICD-10-CM | POA: Diagnosis not present

## 2020-09-22 DIAGNOSIS — R2689 Other abnormalities of gait and mobility: Secondary | ICD-10-CM | POA: Diagnosis not present

## 2020-09-22 DIAGNOSIS — M6281 Muscle weakness (generalized): Secondary | ICD-10-CM | POA: Diagnosis not present

## 2020-09-22 DIAGNOSIS — D649 Anemia, unspecified: Secondary | ICD-10-CM | POA: Diagnosis not present

## 2020-09-23 ENCOUNTER — Ambulatory Visit: Payer: Medicare HMO | Admitting: Orthotics

## 2020-09-23 ENCOUNTER — Other Ambulatory Visit: Payer: Self-pay

## 2020-09-23 DIAGNOSIS — M79676 Pain in unspecified toe(s): Secondary | ICD-10-CM

## 2020-09-23 DIAGNOSIS — D649 Anemia, unspecified: Secondary | ICD-10-CM | POA: Diagnosis not present

## 2020-09-23 DIAGNOSIS — E039 Hypothyroidism, unspecified: Secondary | ICD-10-CM | POA: Diagnosis not present

## 2020-09-23 DIAGNOSIS — E669 Obesity, unspecified: Secondary | ICD-10-CM | POA: Diagnosis not present

## 2020-09-23 DIAGNOSIS — R2689 Other abnormalities of gait and mobility: Secondary | ICD-10-CM | POA: Diagnosis not present

## 2020-09-23 DIAGNOSIS — I10 Essential (primary) hypertension: Secondary | ICD-10-CM | POA: Diagnosis not present

## 2020-09-23 DIAGNOSIS — I69398 Other sequelae of cerebral infarction: Secondary | ICD-10-CM | POA: Diagnosis not present

## 2020-09-23 DIAGNOSIS — M6281 Muscle weakness (generalized): Secondary | ICD-10-CM | POA: Diagnosis not present

## 2020-09-23 DIAGNOSIS — I951 Orthostatic hypotension: Secondary | ICD-10-CM | POA: Diagnosis not present

## 2020-09-23 DIAGNOSIS — M2010 Hallux valgus (acquired), unspecified foot: Secondary | ICD-10-CM

## 2020-09-23 DIAGNOSIS — E119 Type 2 diabetes mellitus without complications: Secondary | ICD-10-CM | POA: Diagnosis not present

## 2020-09-23 NOTE — Progress Notes (Signed)
Need to reorder larger size.

## 2020-09-24 DIAGNOSIS — I951 Orthostatic hypotension: Secondary | ICD-10-CM | POA: Diagnosis not present

## 2020-09-24 DIAGNOSIS — E119 Type 2 diabetes mellitus without complications: Secondary | ICD-10-CM | POA: Diagnosis not present

## 2020-09-24 DIAGNOSIS — D649 Anemia, unspecified: Secondary | ICD-10-CM | POA: Diagnosis not present

## 2020-09-24 DIAGNOSIS — I69398 Other sequelae of cerebral infarction: Secondary | ICD-10-CM | POA: Diagnosis not present

## 2020-09-24 DIAGNOSIS — I10 Essential (primary) hypertension: Secondary | ICD-10-CM | POA: Diagnosis not present

## 2020-09-24 DIAGNOSIS — M6281 Muscle weakness (generalized): Secondary | ICD-10-CM | POA: Diagnosis not present

## 2020-09-24 DIAGNOSIS — E039 Hypothyroidism, unspecified: Secondary | ICD-10-CM | POA: Diagnosis not present

## 2020-09-24 DIAGNOSIS — E669 Obesity, unspecified: Secondary | ICD-10-CM | POA: Diagnosis not present

## 2020-09-24 DIAGNOSIS — R2689 Other abnormalities of gait and mobility: Secondary | ICD-10-CM | POA: Diagnosis not present

## 2020-09-28 ENCOUNTER — Other Ambulatory Visit: Payer: Medicare HMO

## 2020-09-29 DIAGNOSIS — D649 Anemia, unspecified: Secondary | ICD-10-CM | POA: Diagnosis not present

## 2020-09-29 DIAGNOSIS — I69398 Other sequelae of cerebral infarction: Secondary | ICD-10-CM | POA: Diagnosis not present

## 2020-09-29 DIAGNOSIS — I951 Orthostatic hypotension: Secondary | ICD-10-CM | POA: Diagnosis not present

## 2020-09-29 DIAGNOSIS — E119 Type 2 diabetes mellitus without complications: Secondary | ICD-10-CM | POA: Diagnosis not present

## 2020-09-29 DIAGNOSIS — E669 Obesity, unspecified: Secondary | ICD-10-CM | POA: Diagnosis not present

## 2020-09-29 DIAGNOSIS — R2689 Other abnormalities of gait and mobility: Secondary | ICD-10-CM | POA: Diagnosis not present

## 2020-09-29 DIAGNOSIS — I10 Essential (primary) hypertension: Secondary | ICD-10-CM | POA: Diagnosis not present

## 2020-09-29 DIAGNOSIS — M6281 Muscle weakness (generalized): Secondary | ICD-10-CM | POA: Diagnosis not present

## 2020-09-29 DIAGNOSIS — E039 Hypothyroidism, unspecified: Secondary | ICD-10-CM | POA: Diagnosis not present

## 2020-09-30 ENCOUNTER — Other Ambulatory Visit: Payer: Medicare HMO

## 2020-09-30 DIAGNOSIS — R2689 Other abnormalities of gait and mobility: Secondary | ICD-10-CM | POA: Diagnosis not present

## 2020-09-30 DIAGNOSIS — E119 Type 2 diabetes mellitus without complications: Secondary | ICD-10-CM | POA: Diagnosis not present

## 2020-09-30 DIAGNOSIS — I10 Essential (primary) hypertension: Secondary | ICD-10-CM | POA: Diagnosis not present

## 2020-09-30 DIAGNOSIS — I951 Orthostatic hypotension: Secondary | ICD-10-CM | POA: Diagnosis not present

## 2020-09-30 DIAGNOSIS — M6281 Muscle weakness (generalized): Secondary | ICD-10-CM | POA: Diagnosis not present

## 2020-09-30 DIAGNOSIS — D649 Anemia, unspecified: Secondary | ICD-10-CM | POA: Diagnosis not present

## 2020-09-30 DIAGNOSIS — E039 Hypothyroidism, unspecified: Secondary | ICD-10-CM | POA: Diagnosis not present

## 2020-09-30 DIAGNOSIS — I69398 Other sequelae of cerebral infarction: Secondary | ICD-10-CM | POA: Diagnosis not present

## 2020-09-30 DIAGNOSIS — E669 Obesity, unspecified: Secondary | ICD-10-CM | POA: Diagnosis not present

## 2020-10-06 ENCOUNTER — Ambulatory Visit: Payer: Medicare HMO | Admitting: Podiatry

## 2020-10-06 ENCOUNTER — Other Ambulatory Visit: Payer: Medicare HMO

## 2020-10-06 DIAGNOSIS — Z20822 Contact with and (suspected) exposure to covid-19: Secondary | ICD-10-CM | POA: Diagnosis not present

## 2020-10-08 LAB — NOVEL CORONAVIRUS, NAA: SARS-CoV-2, NAA: NOT DETECTED

## 2020-10-08 LAB — SARS-COV-2, NAA 2 DAY TAT

## 2020-10-13 DIAGNOSIS — E119 Type 2 diabetes mellitus without complications: Secondary | ICD-10-CM | POA: Diagnosis not present

## 2020-10-13 DIAGNOSIS — I10 Essential (primary) hypertension: Secondary | ICD-10-CM | POA: Diagnosis not present

## 2020-10-13 DIAGNOSIS — E039 Hypothyroidism, unspecified: Secondary | ICD-10-CM | POA: Diagnosis not present

## 2020-10-13 DIAGNOSIS — I951 Orthostatic hypotension: Secondary | ICD-10-CM | POA: Diagnosis not present

## 2020-10-13 DIAGNOSIS — I69398 Other sequelae of cerebral infarction: Secondary | ICD-10-CM | POA: Diagnosis not present

## 2020-10-13 DIAGNOSIS — E669 Obesity, unspecified: Secondary | ICD-10-CM | POA: Diagnosis not present

## 2020-10-13 DIAGNOSIS — D649 Anemia, unspecified: Secondary | ICD-10-CM | POA: Diagnosis not present

## 2020-10-13 DIAGNOSIS — R2689 Other abnormalities of gait and mobility: Secondary | ICD-10-CM | POA: Diagnosis not present

## 2020-10-13 DIAGNOSIS — M6281 Muscle weakness (generalized): Secondary | ICD-10-CM | POA: Diagnosis not present

## 2020-10-14 DIAGNOSIS — I10 Essential (primary) hypertension: Secondary | ICD-10-CM | POA: Diagnosis not present

## 2020-10-14 DIAGNOSIS — I69398 Other sequelae of cerebral infarction: Secondary | ICD-10-CM | POA: Diagnosis not present

## 2020-10-14 DIAGNOSIS — E669 Obesity, unspecified: Secondary | ICD-10-CM | POA: Diagnosis not present

## 2020-10-14 DIAGNOSIS — M6281 Muscle weakness (generalized): Secondary | ICD-10-CM | POA: Diagnosis not present

## 2020-10-14 DIAGNOSIS — I951 Orthostatic hypotension: Secondary | ICD-10-CM | POA: Diagnosis not present

## 2020-10-14 DIAGNOSIS — E039 Hypothyroidism, unspecified: Secondary | ICD-10-CM | POA: Diagnosis not present

## 2020-10-14 DIAGNOSIS — E119 Type 2 diabetes mellitus without complications: Secondary | ICD-10-CM | POA: Diagnosis not present

## 2020-10-14 DIAGNOSIS — R2689 Other abnormalities of gait and mobility: Secondary | ICD-10-CM | POA: Diagnosis not present

## 2020-10-14 DIAGNOSIS — D649 Anemia, unspecified: Secondary | ICD-10-CM | POA: Diagnosis not present

## 2020-10-15 DIAGNOSIS — M6281 Muscle weakness (generalized): Secondary | ICD-10-CM | POA: Diagnosis not present

## 2020-10-15 DIAGNOSIS — E039 Hypothyroidism, unspecified: Secondary | ICD-10-CM | POA: Diagnosis not present

## 2020-10-15 DIAGNOSIS — R2689 Other abnormalities of gait and mobility: Secondary | ICD-10-CM | POA: Diagnosis not present

## 2020-10-15 DIAGNOSIS — I951 Orthostatic hypotension: Secondary | ICD-10-CM | POA: Diagnosis not present

## 2020-10-15 DIAGNOSIS — I10 Essential (primary) hypertension: Secondary | ICD-10-CM | POA: Diagnosis not present

## 2020-10-15 DIAGNOSIS — E669 Obesity, unspecified: Secondary | ICD-10-CM | POA: Diagnosis not present

## 2020-10-15 DIAGNOSIS — D649 Anemia, unspecified: Secondary | ICD-10-CM | POA: Diagnosis not present

## 2020-10-15 DIAGNOSIS — E119 Type 2 diabetes mellitus without complications: Secondary | ICD-10-CM | POA: Diagnosis not present

## 2020-10-15 DIAGNOSIS — I69398 Other sequelae of cerebral infarction: Secondary | ICD-10-CM | POA: Diagnosis not present

## 2020-10-20 DIAGNOSIS — M6281 Muscle weakness (generalized): Secondary | ICD-10-CM | POA: Diagnosis not present

## 2020-10-20 DIAGNOSIS — R2689 Other abnormalities of gait and mobility: Secondary | ICD-10-CM | POA: Diagnosis not present

## 2020-10-20 DIAGNOSIS — E669 Obesity, unspecified: Secondary | ICD-10-CM | POA: Diagnosis not present

## 2020-10-20 DIAGNOSIS — E039 Hypothyroidism, unspecified: Secondary | ICD-10-CM | POA: Diagnosis not present

## 2020-10-20 DIAGNOSIS — D649 Anemia, unspecified: Secondary | ICD-10-CM | POA: Diagnosis not present

## 2020-10-20 DIAGNOSIS — I951 Orthostatic hypotension: Secondary | ICD-10-CM | POA: Diagnosis not present

## 2020-10-20 DIAGNOSIS — I69398 Other sequelae of cerebral infarction: Secondary | ICD-10-CM | POA: Diagnosis not present

## 2020-10-20 DIAGNOSIS — E119 Type 2 diabetes mellitus without complications: Secondary | ICD-10-CM | POA: Diagnosis not present

## 2020-10-20 DIAGNOSIS — I10 Essential (primary) hypertension: Secondary | ICD-10-CM | POA: Diagnosis not present

## 2020-10-22 DIAGNOSIS — E119 Type 2 diabetes mellitus without complications: Secondary | ICD-10-CM | POA: Diagnosis not present

## 2020-10-22 DIAGNOSIS — I951 Orthostatic hypotension: Secondary | ICD-10-CM | POA: Diagnosis not present

## 2020-10-22 DIAGNOSIS — E039 Hypothyroidism, unspecified: Secondary | ICD-10-CM | POA: Diagnosis not present

## 2020-10-22 DIAGNOSIS — R2689 Other abnormalities of gait and mobility: Secondary | ICD-10-CM | POA: Diagnosis not present

## 2020-10-22 DIAGNOSIS — I10 Essential (primary) hypertension: Secondary | ICD-10-CM | POA: Diagnosis not present

## 2020-10-22 DIAGNOSIS — I69398 Other sequelae of cerebral infarction: Secondary | ICD-10-CM | POA: Diagnosis not present

## 2020-10-22 DIAGNOSIS — D649 Anemia, unspecified: Secondary | ICD-10-CM | POA: Diagnosis not present

## 2020-10-22 DIAGNOSIS — E669 Obesity, unspecified: Secondary | ICD-10-CM | POA: Diagnosis not present

## 2020-10-22 DIAGNOSIS — M6281 Muscle weakness (generalized): Secondary | ICD-10-CM | POA: Diagnosis not present

## 2020-10-27 DIAGNOSIS — E119 Type 2 diabetes mellitus without complications: Secondary | ICD-10-CM | POA: Diagnosis not present

## 2020-10-27 DIAGNOSIS — I951 Orthostatic hypotension: Secondary | ICD-10-CM | POA: Diagnosis not present

## 2020-10-27 DIAGNOSIS — D649 Anemia, unspecified: Secondary | ICD-10-CM | POA: Diagnosis not present

## 2020-10-27 DIAGNOSIS — M6281 Muscle weakness (generalized): Secondary | ICD-10-CM | POA: Diagnosis not present

## 2020-10-27 DIAGNOSIS — I10 Essential (primary) hypertension: Secondary | ICD-10-CM | POA: Diagnosis not present

## 2020-10-27 DIAGNOSIS — R2689 Other abnormalities of gait and mobility: Secondary | ICD-10-CM | POA: Diagnosis not present

## 2020-10-27 DIAGNOSIS — E039 Hypothyroidism, unspecified: Secondary | ICD-10-CM | POA: Diagnosis not present

## 2020-10-27 DIAGNOSIS — I69398 Other sequelae of cerebral infarction: Secondary | ICD-10-CM | POA: Diagnosis not present

## 2020-10-27 DIAGNOSIS — E669 Obesity, unspecified: Secondary | ICD-10-CM | POA: Diagnosis not present

## 2020-10-28 DIAGNOSIS — E669 Obesity, unspecified: Secondary | ICD-10-CM | POA: Diagnosis not present

## 2020-10-28 DIAGNOSIS — D649 Anemia, unspecified: Secondary | ICD-10-CM | POA: Diagnosis not present

## 2020-10-28 DIAGNOSIS — M6281 Muscle weakness (generalized): Secondary | ICD-10-CM | POA: Diagnosis not present

## 2020-10-28 DIAGNOSIS — E039 Hypothyroidism, unspecified: Secondary | ICD-10-CM | POA: Diagnosis not present

## 2020-10-28 DIAGNOSIS — E119 Type 2 diabetes mellitus without complications: Secondary | ICD-10-CM | POA: Diagnosis not present

## 2020-10-28 DIAGNOSIS — R2689 Other abnormalities of gait and mobility: Secondary | ICD-10-CM | POA: Diagnosis not present

## 2020-10-28 DIAGNOSIS — I69398 Other sequelae of cerebral infarction: Secondary | ICD-10-CM | POA: Diagnosis not present

## 2020-10-28 DIAGNOSIS — I10 Essential (primary) hypertension: Secondary | ICD-10-CM | POA: Diagnosis not present

## 2020-10-28 DIAGNOSIS — I951 Orthostatic hypotension: Secondary | ICD-10-CM | POA: Diagnosis not present

## 2020-11-01 DIAGNOSIS — R2689 Other abnormalities of gait and mobility: Secondary | ICD-10-CM | POA: Diagnosis not present

## 2020-11-01 DIAGNOSIS — I951 Orthostatic hypotension: Secondary | ICD-10-CM | POA: Diagnosis not present

## 2020-11-01 DIAGNOSIS — D649 Anemia, unspecified: Secondary | ICD-10-CM | POA: Diagnosis not present

## 2020-11-01 DIAGNOSIS — E119 Type 2 diabetes mellitus without complications: Secondary | ICD-10-CM | POA: Diagnosis not present

## 2020-11-01 DIAGNOSIS — I69398 Other sequelae of cerebral infarction: Secondary | ICD-10-CM | POA: Diagnosis not present

## 2020-11-01 DIAGNOSIS — I10 Essential (primary) hypertension: Secondary | ICD-10-CM | POA: Diagnosis not present

## 2020-11-01 DIAGNOSIS — E039 Hypothyroidism, unspecified: Secondary | ICD-10-CM | POA: Diagnosis not present

## 2020-11-01 DIAGNOSIS — E669 Obesity, unspecified: Secondary | ICD-10-CM | POA: Diagnosis not present

## 2020-11-01 DIAGNOSIS — M6281 Muscle weakness (generalized): Secondary | ICD-10-CM | POA: Diagnosis not present

## 2020-11-02 ENCOUNTER — Telehealth: Payer: Self-pay | Admitting: Podiatry

## 2020-11-02 NOTE — Telephone Encounter (Signed)
pts daughter called and left message stating she was returning my call and pt can come tomorrow around 2.  I returned call and explained that Jennifer Zhang is in [place] tomorrow but I have pt scheduled to pick them up on 2.15 when she already had an appt with Dr Stacie Acres.

## 2020-11-12 DIAGNOSIS — M6281 Muscle weakness (generalized): Secondary | ICD-10-CM | POA: Diagnosis not present

## 2020-11-12 DIAGNOSIS — E039 Hypothyroidism, unspecified: Secondary | ICD-10-CM | POA: Diagnosis not present

## 2020-11-12 DIAGNOSIS — D649 Anemia, unspecified: Secondary | ICD-10-CM | POA: Diagnosis not present

## 2020-11-12 DIAGNOSIS — I951 Orthostatic hypotension: Secondary | ICD-10-CM | POA: Diagnosis not present

## 2020-11-12 DIAGNOSIS — I10 Essential (primary) hypertension: Secondary | ICD-10-CM | POA: Diagnosis not present

## 2020-11-12 DIAGNOSIS — R2689 Other abnormalities of gait and mobility: Secondary | ICD-10-CM | POA: Diagnosis not present

## 2020-11-12 DIAGNOSIS — E669 Obesity, unspecified: Secondary | ICD-10-CM | POA: Diagnosis not present

## 2020-11-12 DIAGNOSIS — E119 Type 2 diabetes mellitus without complications: Secondary | ICD-10-CM | POA: Diagnosis not present

## 2020-11-12 DIAGNOSIS — I69398 Other sequelae of cerebral infarction: Secondary | ICD-10-CM | POA: Diagnosis not present

## 2020-11-16 ENCOUNTER — Ambulatory Visit (INDEPENDENT_AMBULATORY_CARE_PROVIDER_SITE_OTHER): Payer: Medicare HMO | Admitting: Podiatry

## 2020-11-16 ENCOUNTER — Encounter: Payer: Self-pay | Admitting: Podiatry

## 2020-11-16 ENCOUNTER — Ambulatory Visit (INDEPENDENT_AMBULATORY_CARE_PROVIDER_SITE_OTHER): Payer: Medicare HMO | Admitting: Orthotics

## 2020-11-16 ENCOUNTER — Other Ambulatory Visit: Payer: Self-pay

## 2020-11-16 DIAGNOSIS — B351 Tinea unguium: Secondary | ICD-10-CM | POA: Diagnosis not present

## 2020-11-16 DIAGNOSIS — E119 Type 2 diabetes mellitus without complications: Secondary | ICD-10-CM

## 2020-11-16 DIAGNOSIS — M2141 Flat foot [pes planus] (acquired), right foot: Secondary | ICD-10-CM | POA: Diagnosis not present

## 2020-11-16 DIAGNOSIS — M2010 Hallux valgus (acquired), unspecified foot: Secondary | ICD-10-CM

## 2020-11-16 DIAGNOSIS — M2142 Flat foot [pes planus] (acquired), left foot: Secondary | ICD-10-CM | POA: Diagnosis not present

## 2020-11-16 DIAGNOSIS — M79676 Pain in unspecified toe(s): Secondary | ICD-10-CM | POA: Diagnosis not present

## 2020-11-16 NOTE — Progress Notes (Signed)
This patient returns to my office for at risk foot care.  This patient requires this care by a professional since this patient will be at risk due to having diabetes.   This patient is unable to cut nails herself since the patient cannot reach her nails.These nails are painful walking and wearing shoes.  This patient presents for at risk foot care today. Patient presents to the office with her daughter.    General Appearance  Alert, conversant and in no acute stress.  Vascular  Dorsalis pedis and posterior tibial  pulses are weakly  palpable  bilaterally.  Capillary return is within normal limits  bilaterally. Temperature is within normal limits  bilaterally.  Neurologic  Senn-Weinstein monofilament wire test within normal limits  bilaterally. Muscle power within normal limits bilaterally.  Nails Thick disfigured discolored nails with subungual debris  from hallux to fifth toes bilaterally. No evidence of bacterial infection or drainage bilaterally.  Orthopedic  No limitations of motion  feet .  No crepitus or effusions noted.  No bony pathology or digital deformities noted.  HAV  B/L.  Pes planus.  Skin  normotropic skin with no porokeratosis noted bilaterally.  No signs of infections or ulcers noted.     Onychomycosis  Pain in right toes  Pain in left toes  Consent was obtained for treatment procedures.   Mechanical debridement of nails 1-5  bilaterally performed with a nail nipper.  Patient requests no dremel tool usage.  Patient to see Raiford Noble about diabetic shoes.   Return office visit      3 months               Told patient to return for periodic foot care and evaluation due to potential at risk complications.   Helane Gunther DPM

## 2020-11-16 NOTE — Progress Notes (Signed)

## 2020-11-23 DIAGNOSIS — E1142 Type 2 diabetes mellitus with diabetic polyneuropathy: Secondary | ICD-10-CM | POA: Diagnosis not present

## 2020-11-23 DIAGNOSIS — I10 Essential (primary) hypertension: Secondary | ICD-10-CM | POA: Diagnosis not present

## 2020-11-23 DIAGNOSIS — E039 Hypothyroidism, unspecified: Secondary | ICD-10-CM | POA: Diagnosis not present

## 2021-02-15 ENCOUNTER — Other Ambulatory Visit: Payer: Self-pay

## 2021-02-15 ENCOUNTER — Encounter: Payer: Self-pay | Admitting: Podiatry

## 2021-02-15 ENCOUNTER — Ambulatory Visit (INDEPENDENT_AMBULATORY_CARE_PROVIDER_SITE_OTHER): Payer: Medicare HMO | Admitting: Podiatry

## 2021-02-15 DIAGNOSIS — E119 Type 2 diabetes mellitus without complications: Secondary | ICD-10-CM

## 2021-02-15 DIAGNOSIS — M2141 Flat foot [pes planus] (acquired), right foot: Secondary | ICD-10-CM

## 2021-02-15 DIAGNOSIS — M2142 Flat foot [pes planus] (acquired), left foot: Secondary | ICD-10-CM | POA: Diagnosis not present

## 2021-02-15 DIAGNOSIS — M2010 Hallux valgus (acquired), unspecified foot: Secondary | ICD-10-CM | POA: Diagnosis not present

## 2021-02-15 DIAGNOSIS — B351 Tinea unguium: Secondary | ICD-10-CM

## 2021-02-15 DIAGNOSIS — M79676 Pain in unspecified toe(s): Secondary | ICD-10-CM

## 2021-02-15 NOTE — Progress Notes (Signed)
This patient returns to my office for at risk foot care.  This patient requires this care by a professional since this patient will be at risk due to having diabetes.   This patient is unable to cut nails herself since the patient cannot reach her nails.These nails are painful walking and wearing shoes.  This patient presents for at risk foot care today. Patient presents to the office with her daughter.    General Appearance  Alert, conversant and in no acute stress.  Vascular  Dorsalis pedis and posterior tibial  pulses are weakly  palpable  bilaterally.  Capillary return is within normal limits  bilaterally. Temperature is within normal limits  bilaterally.  Neurologic  Senn-Weinstein monofilament wire test within normal limits  bilaterally. Muscle power within normal limits bilaterally.  Nails Thick disfigured discolored nails with subungual debris  from hallux to fifth toes bilaterally. No evidence of bacterial infection or drainage bilaterally.  Orthopedic  No limitations of motion  feet .  No crepitus or effusions noted.  No bony pathology or digital deformities noted.  HAV  B/L.  Pes planus.  Skin  normotropic skin with no porokeratosis noted bilaterally.  No signs of infections or ulcers noted.     Onychomycosis  Pain in right toes  Pain in left toes  Consent was obtained for treatment procedures.   Mechanical debridement of nails 1-5  bilaterally performed with a nail nipper.  Patient requests no dremel tool usage.  Patient to see Raiford Noble about diabetic shoes.   Return office visit      3 months               Told patient to return for periodic foot care and evaluation due to potential at risk complications.   Helane Gunther DPM

## 2021-02-22 DIAGNOSIS — E7800 Pure hypercholesterolemia, unspecified: Secondary | ICD-10-CM | POA: Diagnosis not present

## 2021-02-22 DIAGNOSIS — Z794 Long term (current) use of insulin: Secondary | ICD-10-CM | POA: Diagnosis not present

## 2021-02-22 DIAGNOSIS — E1165 Type 2 diabetes mellitus with hyperglycemia: Secondary | ICD-10-CM | POA: Diagnosis not present

## 2021-02-22 DIAGNOSIS — I10 Essential (primary) hypertension: Secondary | ICD-10-CM | POA: Diagnosis not present

## 2021-02-22 DIAGNOSIS — Z0001 Encounter for general adult medical examination with abnormal findings: Secondary | ICD-10-CM | POA: Diagnosis not present

## 2021-02-22 DIAGNOSIS — M2550 Pain in unspecified joint: Secondary | ICD-10-CM | POA: Diagnosis not present

## 2021-02-22 DIAGNOSIS — I2510 Atherosclerotic heart disease of native coronary artery without angina pectoris: Secondary | ICD-10-CM | POA: Diagnosis not present

## 2021-02-22 DIAGNOSIS — E039 Hypothyroidism, unspecified: Secondary | ICD-10-CM | POA: Diagnosis not present

## 2021-03-11 ENCOUNTER — Other Ambulatory Visit: Payer: Self-pay | Admitting: Internal Medicine

## 2021-03-11 DIAGNOSIS — Z1231 Encounter for screening mammogram for malignant neoplasm of breast: Secondary | ICD-10-CM

## 2021-03-15 DIAGNOSIS — E1165 Type 2 diabetes mellitus with hyperglycemia: Secondary | ICD-10-CM | POA: Diagnosis not present

## 2021-03-15 DIAGNOSIS — E7800 Pure hypercholesterolemia, unspecified: Secondary | ICD-10-CM | POA: Diagnosis not present

## 2021-03-15 DIAGNOSIS — M2550 Pain in unspecified joint: Secondary | ICD-10-CM | POA: Diagnosis not present

## 2021-03-15 DIAGNOSIS — Z794 Long term (current) use of insulin: Secondary | ICD-10-CM | POA: Diagnosis not present

## 2021-03-15 DIAGNOSIS — N182 Chronic kidney disease, stage 2 (mild): Secondary | ICD-10-CM | POA: Diagnosis not present

## 2021-03-15 DIAGNOSIS — I10 Essential (primary) hypertension: Secondary | ICD-10-CM | POA: Diagnosis not present

## 2021-03-15 DIAGNOSIS — E039 Hypothyroidism, unspecified: Secondary | ICD-10-CM | POA: Diagnosis not present

## 2021-03-15 DIAGNOSIS — E1142 Type 2 diabetes mellitus with diabetic polyneuropathy: Secondary | ICD-10-CM | POA: Diagnosis not present

## 2021-03-15 DIAGNOSIS — I2510 Atherosclerotic heart disease of native coronary artery without angina pectoris: Secondary | ICD-10-CM | POA: Diagnosis not present

## 2021-04-20 DIAGNOSIS — E1142 Type 2 diabetes mellitus with diabetic polyneuropathy: Secondary | ICD-10-CM | POA: Diagnosis not present

## 2021-04-20 DIAGNOSIS — Z794 Long term (current) use of insulin: Secondary | ICD-10-CM | POA: Diagnosis not present

## 2021-04-20 DIAGNOSIS — I2510 Atherosclerotic heart disease of native coronary artery without angina pectoris: Secondary | ICD-10-CM | POA: Diagnosis not present

## 2021-04-20 DIAGNOSIS — M2550 Pain in unspecified joint: Secondary | ICD-10-CM | POA: Diagnosis not present

## 2021-04-20 DIAGNOSIS — N182 Chronic kidney disease, stage 2 (mild): Secondary | ICD-10-CM | POA: Diagnosis not present

## 2021-04-20 DIAGNOSIS — E7800 Pure hypercholesterolemia, unspecified: Secondary | ICD-10-CM | POA: Diagnosis not present

## 2021-04-20 DIAGNOSIS — I10 Essential (primary) hypertension: Secondary | ICD-10-CM | POA: Diagnosis not present

## 2021-04-20 DIAGNOSIS — E1165 Type 2 diabetes mellitus with hyperglycemia: Secondary | ICD-10-CM | POA: Diagnosis not present

## 2021-04-20 DIAGNOSIS — E039 Hypothyroidism, unspecified: Secondary | ICD-10-CM | POA: Diagnosis not present

## 2021-05-18 ENCOUNTER — Ambulatory Visit (INDEPENDENT_AMBULATORY_CARE_PROVIDER_SITE_OTHER): Payer: Medicare HMO | Admitting: Podiatry

## 2021-05-18 ENCOUNTER — Other Ambulatory Visit: Payer: Self-pay

## 2021-05-18 ENCOUNTER — Encounter: Payer: Self-pay | Admitting: Podiatry

## 2021-05-18 DIAGNOSIS — E119 Type 2 diabetes mellitus without complications: Secondary | ICD-10-CM | POA: Diagnosis not present

## 2021-05-18 DIAGNOSIS — M2142 Flat foot [pes planus] (acquired), left foot: Secondary | ICD-10-CM

## 2021-05-18 DIAGNOSIS — M79676 Pain in unspecified toe(s): Secondary | ICD-10-CM

## 2021-05-18 DIAGNOSIS — B351 Tinea unguium: Secondary | ICD-10-CM | POA: Diagnosis not present

## 2021-05-18 DIAGNOSIS — M2010 Hallux valgus (acquired), unspecified foot: Secondary | ICD-10-CM

## 2021-05-18 DIAGNOSIS — M2141 Flat foot [pes planus] (acquired), right foot: Secondary | ICD-10-CM

## 2021-05-18 NOTE — Progress Notes (Signed)
This patient returns to my office for at risk foot care.  This patient requires this care by a professional since this patient will be at risk due to having diabetes.   This patient is unable to cut nails herself since the patient cannot reach her nails.These nails are painful walking and wearing shoes.  This patient presents for at risk foot care today. Patient presents to the office with her daughter.    General Appearance  Alert, conversant and in no acute stress.  Vascular  Dorsalis pedis and posterior tibial  pulses are weakly  palpable  bilaterally.  Capillary return is within normal limits  bilaterally. Temperature is within normal limits  bilaterally.  Neurologic  Senn-Weinstein monofilament wire test within normal limits  bilaterally. Muscle power within normal limits bilaterally.  Nails Thick disfigured discolored nails with subungual debris  from hallux to fifth toes bilaterally. No evidence of bacterial infection or drainage bilaterally.  Orthopedic  No limitations of motion  feet .  No crepitus or effusions noted.  No bony pathology or digital deformities noted.  HAV  B/L.  Pes planus.  Skin  normotropic skin with no porokeratosis noted bilaterally.  No signs of infections or ulcers noted.     Onychomycosis  Pain in right toes  Pain in left toes  Consent was obtained for treatment procedures.   Mechanical debridement of nails 1-5  bilaterally performed with a nail nipper.  Patient requests no dremel tool usage.    Return office visit      3 months               Told patient to return for periodic foot care and evaluation due to potential at risk complications.   Inez Rosato DPM  

## 2021-06-21 DIAGNOSIS — E1142 Type 2 diabetes mellitus with diabetic polyneuropathy: Secondary | ICD-10-CM | POA: Diagnosis not present

## 2021-06-21 DIAGNOSIS — E7800 Pure hypercholesterolemia, unspecified: Secondary | ICD-10-CM | POA: Diagnosis not present

## 2021-06-21 DIAGNOSIS — N182 Chronic kidney disease, stage 2 (mild): Secondary | ICD-10-CM | POA: Diagnosis not present

## 2021-06-21 DIAGNOSIS — E039 Hypothyroidism, unspecified: Secondary | ICD-10-CM | POA: Diagnosis not present

## 2021-06-21 DIAGNOSIS — I2510 Atherosclerotic heart disease of native coronary artery without angina pectoris: Secondary | ICD-10-CM | POA: Diagnosis not present

## 2021-06-21 DIAGNOSIS — I10 Essential (primary) hypertension: Secondary | ICD-10-CM | POA: Diagnosis not present

## 2021-06-21 DIAGNOSIS — Z794 Long term (current) use of insulin: Secondary | ICD-10-CM | POA: Diagnosis not present

## 2021-06-21 DIAGNOSIS — M2550 Pain in unspecified joint: Secondary | ICD-10-CM | POA: Diagnosis not present

## 2021-06-21 DIAGNOSIS — E1165 Type 2 diabetes mellitus with hyperglycemia: Secondary | ICD-10-CM | POA: Diagnosis not present

## 2021-07-01 ENCOUNTER — Ambulatory Visit: Payer: Medicare HMO

## 2021-07-05 DIAGNOSIS — I10 Essential (primary) hypertension: Secondary | ICD-10-CM | POA: Diagnosis not present

## 2021-07-05 DIAGNOSIS — E7800 Pure hypercholesterolemia, unspecified: Secondary | ICD-10-CM | POA: Diagnosis not present

## 2021-07-05 DIAGNOSIS — Z23 Encounter for immunization: Secondary | ICD-10-CM | POA: Diagnosis not present

## 2021-07-28 ENCOUNTER — Ambulatory Visit: Payer: Medicare HMO

## 2021-08-08 DIAGNOSIS — Z0100 Encounter for examination of eyes and vision without abnormal findings: Secondary | ICD-10-CM | POA: Diagnosis not present

## 2021-08-08 DIAGNOSIS — E119 Type 2 diabetes mellitus without complications: Secondary | ICD-10-CM | POA: Diagnosis not present

## 2021-08-08 DIAGNOSIS — H5200 Hypermetropia, unspecified eye: Secondary | ICD-10-CM | POA: Diagnosis not present

## 2021-08-23 ENCOUNTER — Encounter: Payer: Self-pay | Admitting: Podiatry

## 2021-08-23 ENCOUNTER — Ambulatory Visit (INDEPENDENT_AMBULATORY_CARE_PROVIDER_SITE_OTHER): Payer: Medicare HMO | Admitting: Podiatry

## 2021-08-23 ENCOUNTER — Other Ambulatory Visit: Payer: Self-pay

## 2021-08-23 DIAGNOSIS — M2010 Hallux valgus (acquired), unspecified foot: Secondary | ICD-10-CM

## 2021-08-23 DIAGNOSIS — E119 Type 2 diabetes mellitus without complications: Secondary | ICD-10-CM

## 2021-08-23 DIAGNOSIS — M2142 Flat foot [pes planus] (acquired), left foot: Secondary | ICD-10-CM

## 2021-08-23 DIAGNOSIS — M79676 Pain in unspecified toe(s): Secondary | ICD-10-CM | POA: Diagnosis not present

## 2021-08-23 DIAGNOSIS — B351 Tinea unguium: Secondary | ICD-10-CM | POA: Diagnosis not present

## 2021-08-23 DIAGNOSIS — M2141 Flat foot [pes planus] (acquired), right foot: Secondary | ICD-10-CM

## 2021-08-23 NOTE — Progress Notes (Signed)
This patient returns to my office for at risk foot care.  This patient requires this care by a professional since this patient will be at risk due to having diabetes.   This patient is unable to cut nails herself since the patient cannot reach her nails.These nails are painful walking and wearing shoes.  This patient presents for at risk foot care today. Patient presents to the office with her daughter.    General Appearance  Alert, conversant and in no acute stress.  Vascular  Dorsalis pedis and posterior tibial  pulses are weakly  palpable  bilaterally.  Capillary return is within normal limits  bilaterally. Temperature is within normal limits  bilaterally.  Neurologic  Senn-Weinstein monofilament wire test within normal limits  bilaterally. Muscle power within normal limits bilaterally.  Nails Thick disfigured discolored nails with subungual debris  from hallux to fifth toes bilaterally. No evidence of bacterial infection or drainage bilaterally.  Orthopedic  No limitations of motion  feet .  No crepitus or effusions noted.  No bony pathology or digital deformities noted.  HAV  B/L.  Pes planus.  Skin  normotropic skin with no porokeratosis noted bilaterally.  No signs of infections or ulcers noted.     Onychomycosis  Pain in right toes  Pain in left toes  Consent was obtained for treatment procedures.   Mechanical debridement of nails 1-5  bilaterally performed with a nail nipper.  Patient requests no dremel tool usage.    Return office visit      3 months               Told patient to return for periodic foot care and evaluation due to potential at risk complications.   Leotta Weingarten DPM  

## 2021-08-24 ENCOUNTER — Ambulatory Visit
Admission: RE | Admit: 2021-08-24 | Discharge: 2021-08-24 | Disposition: A | Discharge status: Home / Self Care | Payer: Medicare HMO | Source: Ambulatory Visit | Attending: Internal Medicine | Admitting: Internal Medicine

## 2021-08-24 DIAGNOSIS — Z1231 Encounter for screening mammogram for malignant neoplasm of breast: Secondary | ICD-10-CM

## 2021-10-11 DIAGNOSIS — E1142 Type 2 diabetes mellitus with diabetic polyneuropathy: Secondary | ICD-10-CM | POA: Diagnosis not present

## 2021-10-21 DIAGNOSIS — E1142 Type 2 diabetes mellitus with diabetic polyneuropathy: Secondary | ICD-10-CM | POA: Diagnosis not present

## 2021-12-21 ENCOUNTER — Other Ambulatory Visit: Payer: Self-pay

## 2021-12-21 ENCOUNTER — Ambulatory Visit (INDEPENDENT_AMBULATORY_CARE_PROVIDER_SITE_OTHER): Payer: Medicare Other | Admitting: Podiatry

## 2021-12-21 ENCOUNTER — Encounter: Payer: Self-pay | Admitting: Podiatry

## 2021-12-21 DIAGNOSIS — B351 Tinea unguium: Secondary | ICD-10-CM | POA: Diagnosis not present

## 2021-12-21 DIAGNOSIS — M2141 Flat foot [pes planus] (acquired), right foot: Secondary | ICD-10-CM

## 2021-12-21 DIAGNOSIS — M2142 Flat foot [pes planus] (acquired), left foot: Secondary | ICD-10-CM

## 2021-12-21 DIAGNOSIS — M79676 Pain in unspecified toe(s): Secondary | ICD-10-CM | POA: Diagnosis not present

## 2021-12-21 DIAGNOSIS — M2010 Hallux valgus (acquired), unspecified foot: Secondary | ICD-10-CM

## 2021-12-21 DIAGNOSIS — E119 Type 2 diabetes mellitus without complications: Secondary | ICD-10-CM | POA: Diagnosis not present

## 2021-12-21 NOTE — Progress Notes (Signed)
This patient returns to my office for at risk foot care.  This patient requires this care by a professional since this patient will be at risk due to having diabetes.   This patient is unable to cut nails herself since the patient cannot reach her nails.These nails are painful walking and wearing shoes.  This patient presents for at risk foot care today. Patient presents to the office with her daughter.    General Appearance  Alert, conversant and in no acute stress.  Vascular  Dorsalis pedis and posterior tibial  pulses are weakly  palpable  bilaterally.  Capillary return is within normal limits  bilaterally. Temperature is within normal limits  bilaterally.  Neurologic  Senn-Weinstein monofilament wire test within normal limits  bilaterally. Muscle power within normal limits bilaterally.  Nails Thick disfigured discolored nails with subungual debris  from hallux to fifth toes bilaterally. No evidence of bacterial infection or drainage bilaterally.  Orthopedic  No limitations of motion  feet .  No crepitus or effusions noted.  No bony pathology or digital deformities noted.  HAV  B/L.  Pes planus.  Skin  normotropic skin with no porokeratosis noted bilaterally.  No signs of infections or ulcers noted.     Onychomycosis  Pain in right toes  Pain in left toes  Consent was obtained for treatment procedures.   Mechanical debridement of nails 1-5  bilaterally performed with a nail nipper.  Patient requests no dremel tool usage.    Return office visit      3 months               Told patient to return for periodic foot care and evaluation due to potential at risk complications.   Sherril Heyward DPM  

## 2022-02-08 ENCOUNTER — Other Ambulatory Visit: Payer: Self-pay | Admitting: Internal Medicine

## 2022-02-08 DIAGNOSIS — Z1231 Encounter for screening mammogram for malignant neoplasm of breast: Secondary | ICD-10-CM

## 2022-03-27 ENCOUNTER — Ambulatory Visit: Payer: Medicare Other | Admitting: Podiatry

## 2022-04-18 ENCOUNTER — Ambulatory Visit (INDEPENDENT_AMBULATORY_CARE_PROVIDER_SITE_OTHER): Payer: Medicare Other | Admitting: Podiatry

## 2022-04-18 ENCOUNTER — Encounter: Payer: Self-pay | Admitting: Podiatry

## 2022-04-18 DIAGNOSIS — E119 Type 2 diabetes mellitus without complications: Secondary | ICD-10-CM

## 2022-04-18 DIAGNOSIS — M2010 Hallux valgus (acquired), unspecified foot: Secondary | ICD-10-CM

## 2022-04-18 DIAGNOSIS — M2141 Flat foot [pes planus] (acquired), right foot: Secondary | ICD-10-CM

## 2022-04-18 DIAGNOSIS — B351 Tinea unguium: Secondary | ICD-10-CM | POA: Diagnosis not present

## 2022-04-18 DIAGNOSIS — M2142 Flat foot [pes planus] (acquired), left foot: Secondary | ICD-10-CM

## 2022-04-18 DIAGNOSIS — M79676 Pain in unspecified toe(s): Secondary | ICD-10-CM

## 2022-04-18 NOTE — Progress Notes (Signed)
This patient returns to my office for at risk foot care.  This patient requires this care by a professional since this patient will be at risk due to having diabetes.   This patient is unable to cut nails herself since the patient cannot reach her nails.These nails are painful walking and wearing shoes.  This patient presents for at risk foot care today. Patient presents to the office with her daughter.    General Appearance  Alert, conversant and in no acute stress.  Vascular  Dorsalis pedis and posterior tibial  pulses are weakly  palpable  bilaterally.  Capillary return is within normal limits  bilaterally. Temperature is within normal limits  bilaterally.  Neurologic  Senn-Weinstein monofilament wire test within normal limits  bilaterally. Muscle power within normal limits bilaterally.  Nails Thick disfigured discolored nails with subungual debris  from hallux to fifth toes bilaterally. No evidence of bacterial infection or drainage bilaterally.  Orthopedic  No limitations of motion  feet .  No crepitus or effusions noted.  No bony pathology or digital deformities noted.  HAV  B/L.  Pes planus.  Skin  normotropic skin with no porokeratosis noted bilaterally.  No signs of infections or ulcers noted.     Onychomycosis  Pain in right toes  Pain in left toes  Consent was obtained for treatment procedures.   Mechanical debridement of nails 1-5  bilaterally performed with a nail nipper.  Patient requests no dremel tool usage.    Return office visit      3 months               Told patient to return for periodic foot care and evaluation due to potential at risk complications.   Robbye Dede DPM  

## 2022-07-18 ENCOUNTER — Ambulatory Visit (INDEPENDENT_AMBULATORY_CARE_PROVIDER_SITE_OTHER): Payer: Medicare Other | Admitting: Podiatry

## 2022-07-18 ENCOUNTER — Encounter: Payer: Self-pay | Admitting: Podiatry

## 2022-07-18 DIAGNOSIS — M79676 Pain in unspecified toe(s): Secondary | ICD-10-CM | POA: Diagnosis not present

## 2022-07-18 DIAGNOSIS — B351 Tinea unguium: Secondary | ICD-10-CM | POA: Diagnosis not present

## 2022-07-18 DIAGNOSIS — M2141 Flat foot [pes planus] (acquired), right foot: Secondary | ICD-10-CM

## 2022-07-18 DIAGNOSIS — E119 Type 2 diabetes mellitus without complications: Secondary | ICD-10-CM | POA: Diagnosis not present

## 2022-07-18 DIAGNOSIS — M2010 Hallux valgus (acquired), unspecified foot: Secondary | ICD-10-CM

## 2022-07-18 DIAGNOSIS — M2142 Flat foot [pes planus] (acquired), left foot: Secondary | ICD-10-CM

## 2022-07-18 NOTE — Progress Notes (Signed)
This patient returns to my office for at risk foot care.  This patient requires this care by a professional since this patient will be at risk due to having diabetes.   This patient is unable to cut nails herself since the patient cannot reach her nails.These nails are painful walking and wearing shoes.  This patient presents for at risk foot care today. Patient presents to the office with her daughter.    General Appearance  Alert, conversant and in no acute stress.  Vascular  Dorsalis pedis and posterior tibial  pulses are weakly  palpable  bilaterally.  Capillary return is within normal limits  bilaterally. Temperature is within normal limits  bilaterally.  Neurologic  Senn-Weinstein monofilament wire test within normal limits  bilaterally. Muscle power within normal limits bilaterally.  Nails Thick disfigured discolored nails with subungual debris  from hallux to fifth toes bilaterally. No evidence of bacterial infection or drainage bilaterally.  Orthopedic  No limitations of motion  feet .  No crepitus or effusions noted.  No bony pathology or digital deformities noted.  HAV  B/L.  Pes planus.  Skin  normotropic skin with no porokeratosis noted bilaterally.  No signs of infections or ulcers noted.     Onychomycosis  Pain in right toes  Pain in left toes  Consent was obtained for treatment procedures.   Mechanical debridement of nails 1-5  bilaterally performed with a nail nipper.  Patient requests no dremel tool usage.    Return office visit      3 months               Told patient to return for periodic foot care and evaluation due to potential at risk complications.   Rejeana Fadness DPM  

## 2022-08-28 ENCOUNTER — Ambulatory Visit
Admission: RE | Admit: 2022-08-28 | Discharge: 2022-08-28 | Disposition: A | Discharge status: Home / Self Care | Payer: Medicare Other | Source: Ambulatory Visit | Attending: Internal Medicine | Admitting: Internal Medicine

## 2022-08-28 DIAGNOSIS — Z1231 Encounter for screening mammogram for malignant neoplasm of breast: Secondary | ICD-10-CM

## 2022-09-20 IMAGING — MG MM DIGITAL SCREENING BILAT W/ TOMO AND CAD
6 of 10 series · 6 of 30 positions shown · non-contrast
Comparison: Previous exam(s).

CLINICAL DATA: Screening.

EXAM:
DIGITAL SCREENING BILATERAL MAMMOGRAM WITH TOMOSYNTHESIS AND CAD
TECHNIQUE: Bilateral screening digital craniocaudal and mediolateral oblique
mammograms were obtained. Bilateral screening digital breast
tomosynthesis was performed. The images were evaluated with
computer-aided detection.

[R MLO synth-2D]
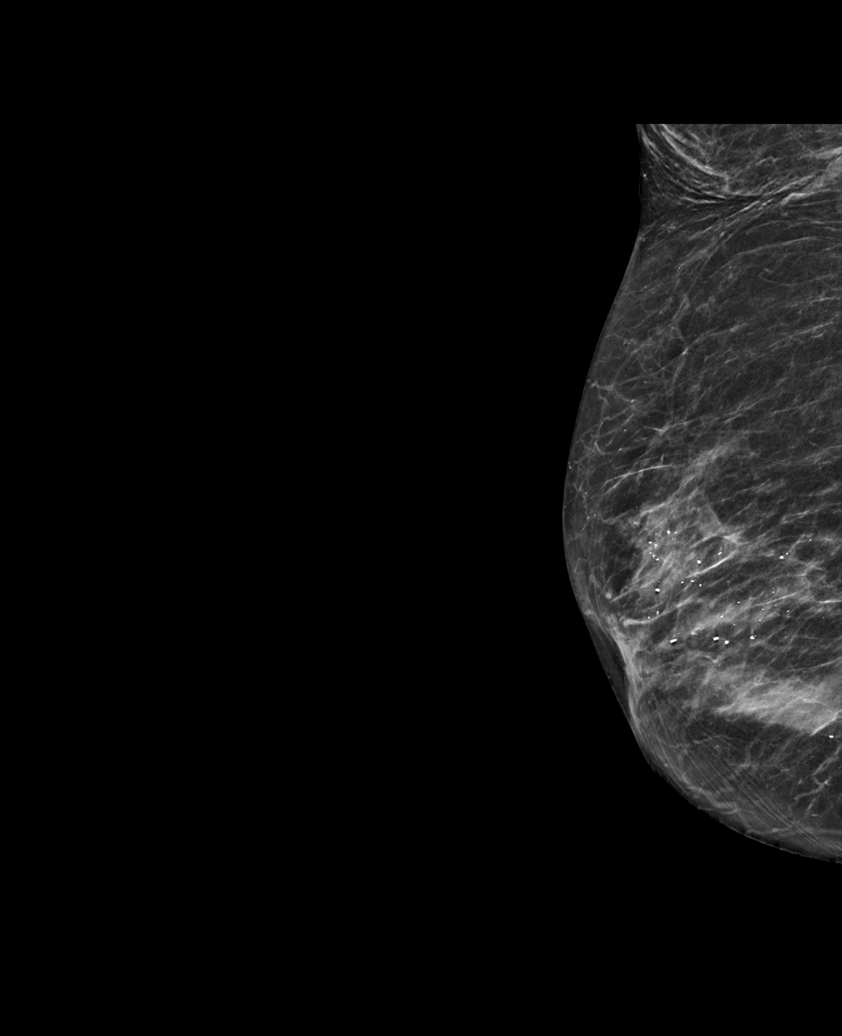

[L XCCL synth-2D]
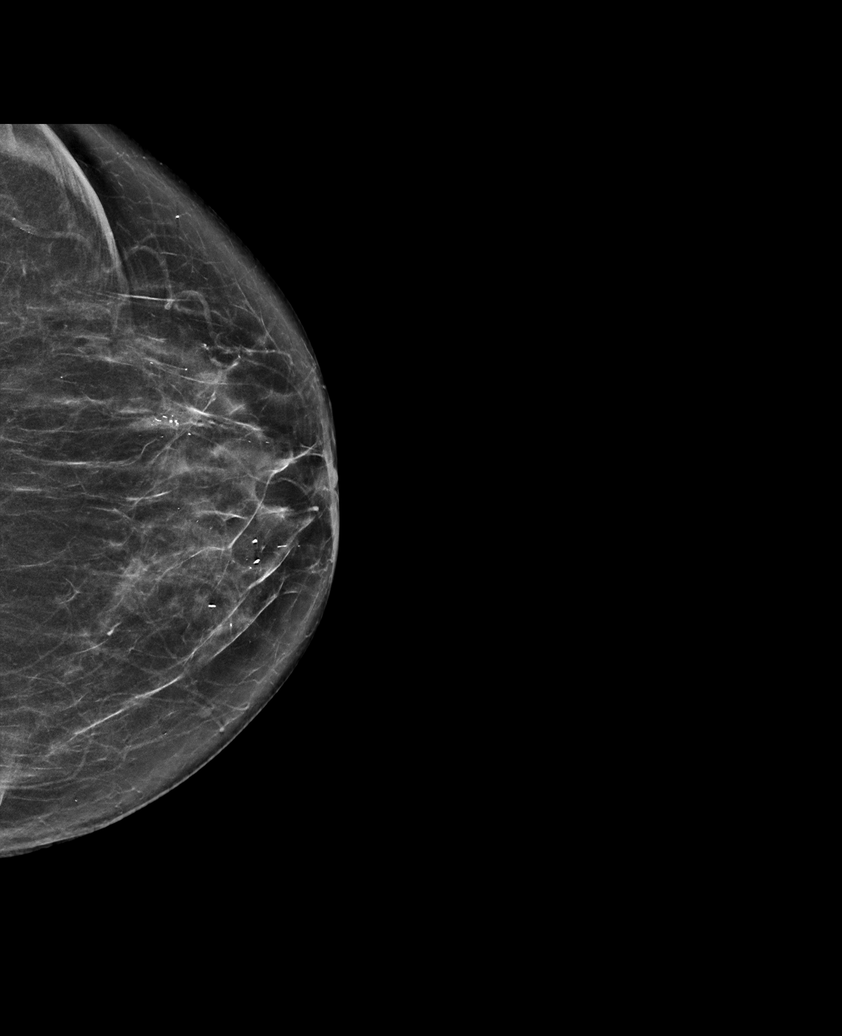

[L CC synth-2D]
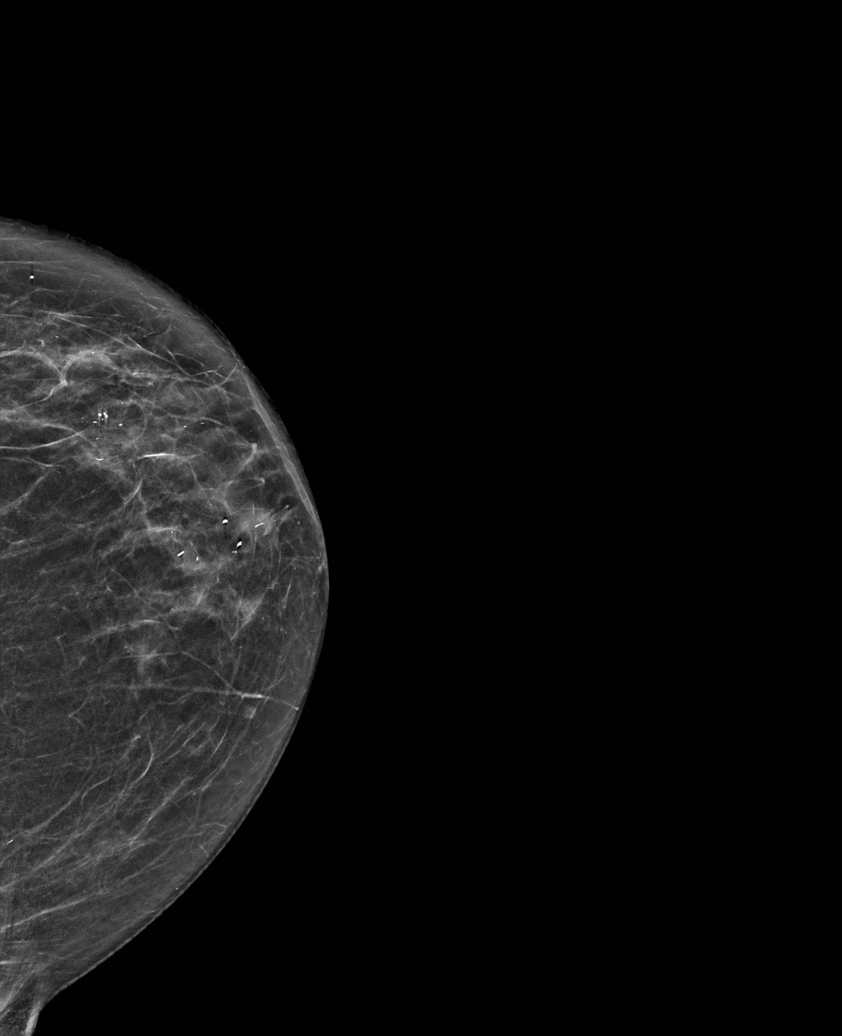

[L MLO synth-2D]
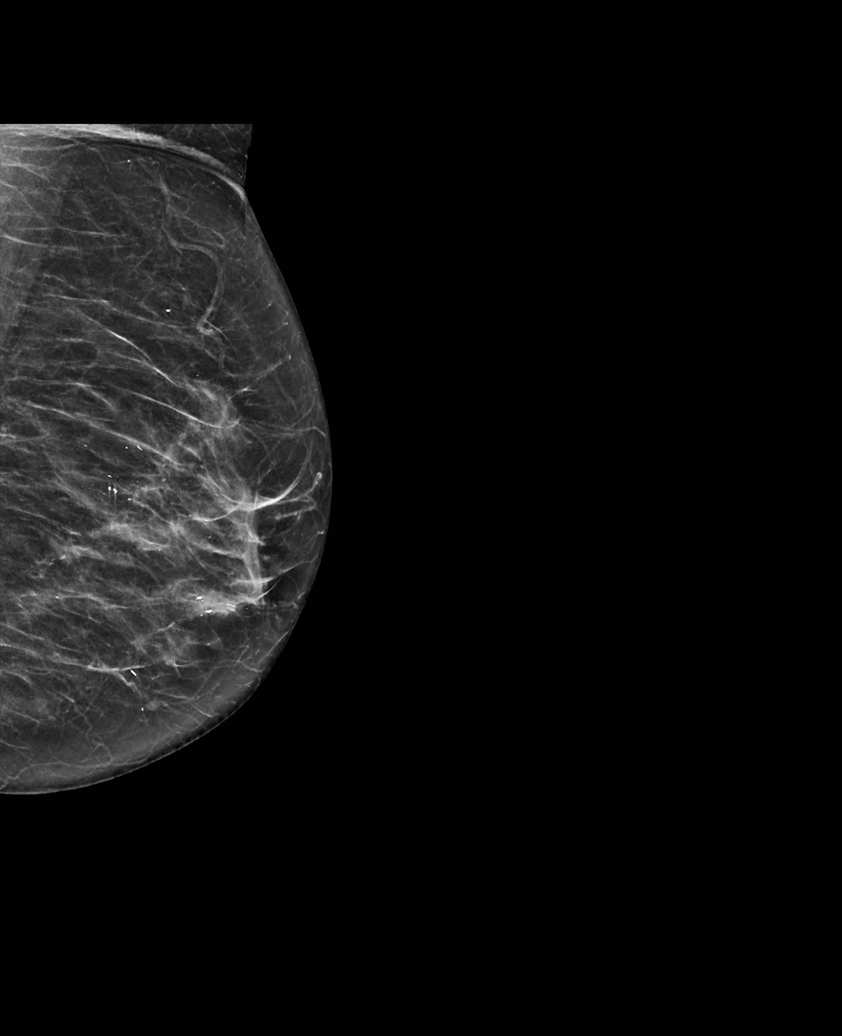

[R CC synth-2D]
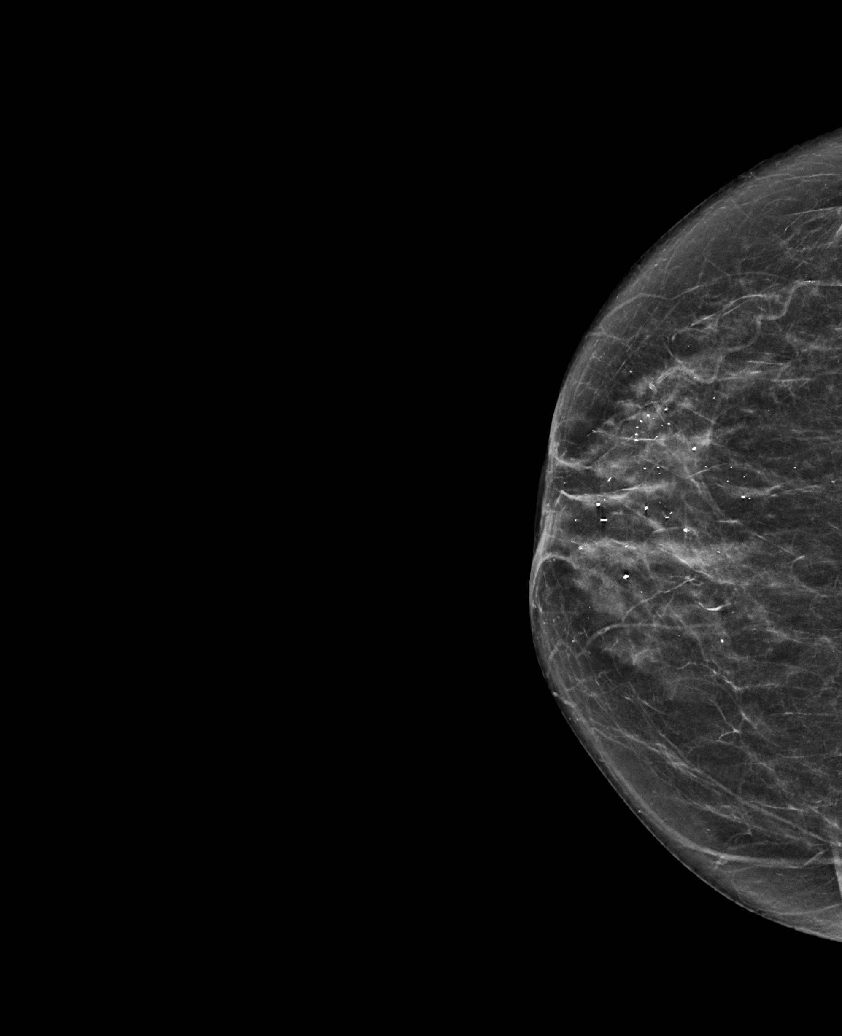

[R MLO tomo · tomo slice 33/64.0]
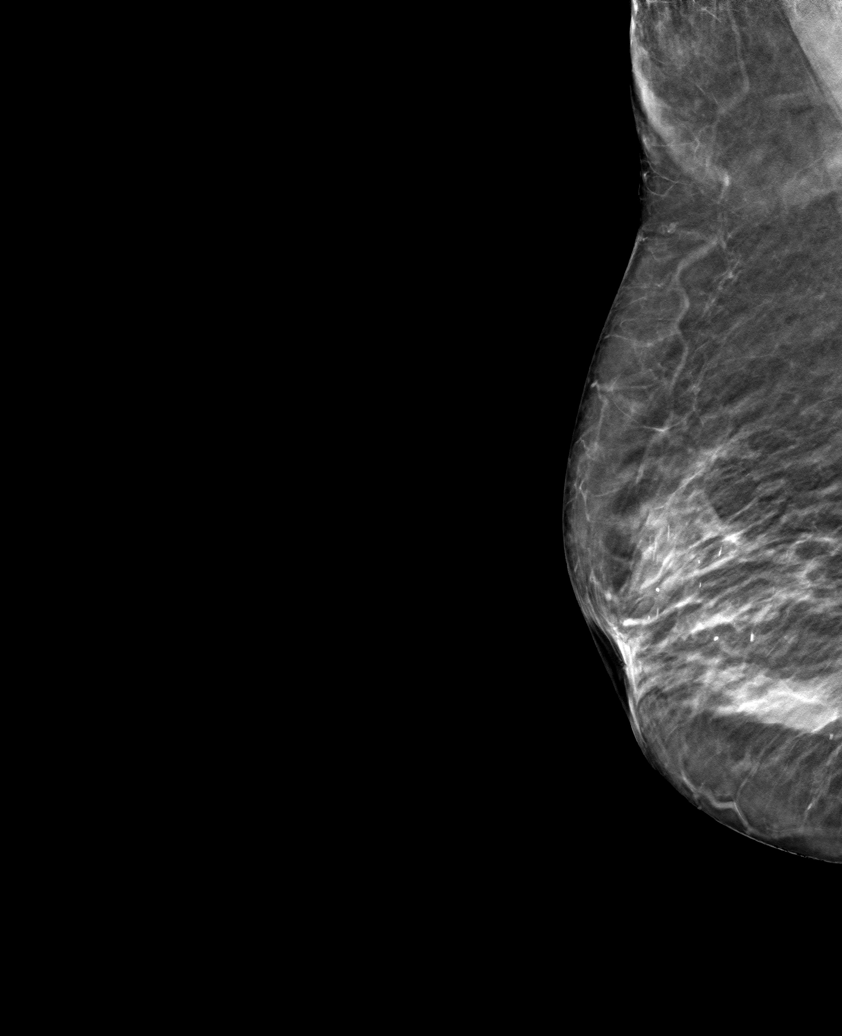

[6 of 30 positions shown; findings below may reference images not displayed]

ACR Breast Density Category b: There are scattered areas of
fibroglandular density.
FINDINGS: There are no findings suspicious for malignancy.
IMPRESSION: No mammographic evidence of malignancy. A result letter of this
screening mammogram will be mailed directly to the patient.

RECOMMENDATION:
Screening mammogram in one year. (Code:51-O-LD2)

BI-RADS CATEGORY  1: Negative.

## 2022-10-18 ENCOUNTER — Ambulatory Visit (INDEPENDENT_AMBULATORY_CARE_PROVIDER_SITE_OTHER): Payer: 59 | Admitting: Podiatry

## 2022-10-18 ENCOUNTER — Encounter: Payer: Self-pay | Admitting: Podiatry

## 2022-10-18 DIAGNOSIS — E119 Type 2 diabetes mellitus without complications: Secondary | ICD-10-CM

## 2022-10-18 DIAGNOSIS — M2141 Flat foot [pes planus] (acquired), right foot: Secondary | ICD-10-CM | POA: Diagnosis not present

## 2022-10-18 DIAGNOSIS — M79676 Pain in unspecified toe(s): Secondary | ICD-10-CM

## 2022-10-18 DIAGNOSIS — M2142 Flat foot [pes planus] (acquired), left foot: Secondary | ICD-10-CM

## 2022-10-18 DIAGNOSIS — B351 Tinea unguium: Secondary | ICD-10-CM

## 2022-10-18 DIAGNOSIS — M2010 Hallux valgus (acquired), unspecified foot: Secondary | ICD-10-CM | POA: Diagnosis not present

## 2022-10-18 NOTE — Progress Notes (Signed)
This patient returns to my office for at risk foot care.  This patient requires this care by a professional since this patient will be at risk due to having diabetes.   This patient is unable to cut nails herself since the patient cannot reach her nails.These nails are painful walking and wearing shoes.  This patient presents for at risk foot care today. Patient presents to the office with her daughter.    General Appearance  Alert, conversant and in no acute stress.  Vascular  Dorsalis pedis and posterior tibial  pulses are weakly  palpable  bilaterally.  Capillary return is within normal limits  bilaterally. Temperature is within normal limits  bilaterally.  Neurologic  Senn-Weinstein monofilament wire test within normal limits  bilaterally. Muscle power within normal limits bilaterally.  Nails Thick disfigured discolored nails with subungual debris  from hallux to fifth toes bilaterally. No evidence of bacterial infection or drainage bilaterally.  Orthopedic  No limitations of motion  feet .  No crepitus or effusions noted.  No bony pathology or digital deformities noted.  HAV  B/L.  Pes planus.  Skin  normotropic skin with no porokeratosis noted bilaterally.  No signs of infections or ulcers noted.     Onychomycosis  Pain in right toes  Pain in left toes  Consent was obtained for treatment procedures.   Mechanical debridement of nails 1-5  bilaterally performed with a nail nipper.  Patient requests no dremel tool usage.    Return office visit      3 months               Told patient to return for periodic foot care and evaluation due to potential at risk complications.   Gardiner Barefoot DPM

## 2023-01-17 ENCOUNTER — Ambulatory Visit (INDEPENDENT_AMBULATORY_CARE_PROVIDER_SITE_OTHER): Payer: 59 | Admitting: Podiatry

## 2023-01-17 ENCOUNTER — Encounter: Payer: Self-pay | Admitting: Podiatry

## 2023-01-17 DIAGNOSIS — M79676 Pain in unspecified toe(s): Secondary | ICD-10-CM | POA: Diagnosis not present

## 2023-01-17 DIAGNOSIS — E119 Type 2 diabetes mellitus without complications: Secondary | ICD-10-CM

## 2023-01-17 DIAGNOSIS — B351 Tinea unguium: Secondary | ICD-10-CM

## 2023-01-17 NOTE — Progress Notes (Signed)
This patient returns to my office for at risk foot care.  This patient requires this care by a professional since this patient will be at risk due to having diabetes.   This patient is unable to cut nails herself since the patient cannot reach her nails.These nails are painful walking and wearing shoes.  This patient presents for at risk foot care today. Patient presents to the office with her daughter.    General Appearance  Alert, conversant and in no acute stress.  Vascular  Dorsalis pedis and posterior tibial  pulses are weakly  palpable  bilaterally.  Capillary return is within normal limits  bilaterally. Temperature is within normal limits  bilaterally.  Neurologic  Senn-Weinstein monofilament wire test within normal limits  bilaterally. Muscle power within normal limits bilaterally.  Nails Thick disfigured discolored nails with subungual debris  from hallux to fifth toes bilaterally. No evidence of bacterial infection or drainage bilaterally.  Orthopedic  No limitations of motion  feet .  No crepitus or effusions noted.  No bony pathology or digital deformities noted.  HAV  B/L.  Pes planus.  Skin  normotropic skin with no porokeratosis noted bilaterally.  No signs of infections or ulcers noted.     Onychomycosis  Pain in right toes  Pain in left toes  Consent was obtained for treatment procedures.   Mechanical debridement of nails 1-5  bilaterally performed with a nail nipper.  Patient requests no dremel tool usage.    Return office visit      3 months               Told patient to return for periodic foot care and evaluation due to potential at risk complications.   Yunuen Mordan DPM  

## 2023-04-18 ENCOUNTER — Encounter: Payer: Self-pay | Admitting: Podiatry

## 2023-04-18 ENCOUNTER — Ambulatory Visit: Payer: 59 | Admitting: Podiatry

## 2023-04-18 DIAGNOSIS — E119 Type 2 diabetes mellitus without complications: Secondary | ICD-10-CM | POA: Diagnosis not present

## 2023-04-18 DIAGNOSIS — B351 Tinea unguium: Secondary | ICD-10-CM | POA: Diagnosis not present

## 2023-04-18 DIAGNOSIS — M79676 Pain in unspecified toe(s): Secondary | ICD-10-CM

## 2023-04-18 DIAGNOSIS — M2010 Hallux valgus (acquired), unspecified foot: Secondary | ICD-10-CM

## 2023-04-18 NOTE — Progress Notes (Signed)
This patient returns to my office for at risk foot care.  This patient requires this care by a professional since this patient will be at risk due to having diabetes.   This patient is unable to cut nails herself since the patient cannot reach her nails.These nails are painful walking and wearing shoes.  This patient presents for at risk foot care today. Patient presents to the office with her daughter.    General Appearance  Alert, conversant and in no acute stress.  Vascular  Dorsalis pedis and posterior tibial  pulses are weakly  palpable  bilaterally.  Capillary return is within normal limits  bilaterally. Temperature is within normal limits  bilaterally.  Neurologic  Senn-Weinstein monofilament wire test within normal limits  bilaterally. Muscle power within normal limits bilaterally.  Nails Thick disfigured discolored nails with subungual debris  from hallux to fifth toes bilaterally. No evidence of bacterial infection or drainage bilaterally.  Orthopedic  No limitations of motion  feet .  No crepitus or effusions noted.  No bony pathology or digital deformities noted.  HAV  B/L.  Pes planus.  Skin  normotropic skin with no porokeratosis noted bilaterally.  No signs of infections or ulcers noted.     Onychomycosis  Pain in right toes  Pain in left toes  Consent was obtained for treatment procedures.   Mechanical debridement of nails 1-5  bilaterally performed with a nail nipper.  Patient requests no dremel tool usage.    Return office visit      3 months               Told patient to return for periodic foot care and evaluation due to potential at risk complications.   Gregory Mayer DPM  

## 2023-07-05 ENCOUNTER — Other Ambulatory Visit: Payer: Self-pay

## 2023-07-05 ENCOUNTER — Observation Stay (HOSPITAL_COMMUNITY)
Admission: EM | Admit: 2023-07-05 | Discharge: 2023-07-06 | Disposition: A | Discharge status: Home / Self Care | Payer: 59 | Attending: Internal Medicine | Admitting: Internal Medicine

## 2023-07-05 ENCOUNTER — Emergency Department (HOSPITAL_COMMUNITY): Payer: 59

## 2023-07-05 ENCOUNTER — Encounter (HOSPITAL_COMMUNITY): Payer: Self-pay

## 2023-07-05 DIAGNOSIS — R55 Syncope and collapse: Secondary | ICD-10-CM | POA: Diagnosis not present

## 2023-07-05 DIAGNOSIS — D649 Anemia, unspecified: Secondary | ICD-10-CM | POA: Diagnosis present

## 2023-07-05 DIAGNOSIS — Z7984 Long term (current) use of oral hypoglycemic drugs: Secondary | ICD-10-CM | POA: Insufficient documentation

## 2023-07-05 DIAGNOSIS — R918 Other nonspecific abnormal finding of lung field: Secondary | ICD-10-CM | POA: Diagnosis not present

## 2023-07-05 DIAGNOSIS — R008 Other abnormalities of heart beat: Secondary | ICD-10-CM | POA: Diagnosis not present

## 2023-07-05 DIAGNOSIS — I498 Other specified cardiac arrhythmias: Secondary | ICD-10-CM | POA: Diagnosis present

## 2023-07-05 DIAGNOSIS — R2689 Other abnormalities of gait and mobility: Secondary | ICD-10-CM | POA: Insufficient documentation

## 2023-07-05 DIAGNOSIS — E785 Hyperlipidemia, unspecified: Secondary | ICD-10-CM | POA: Diagnosis present

## 2023-07-05 DIAGNOSIS — Z794 Long term (current) use of insulin: Secondary | ICD-10-CM | POA: Diagnosis not present

## 2023-07-05 DIAGNOSIS — E876 Hypokalemia: Principal | ICD-10-CM | POA: Diagnosis present

## 2023-07-05 DIAGNOSIS — M6281 Muscle weakness (generalized): Secondary | ICD-10-CM | POA: Insufficient documentation

## 2023-07-05 DIAGNOSIS — E119 Type 2 diabetes mellitus without complications: Secondary | ICD-10-CM | POA: Diagnosis not present

## 2023-07-05 DIAGNOSIS — I10 Essential (primary) hypertension: Secondary | ICD-10-CM | POA: Diagnosis not present

## 2023-07-05 DIAGNOSIS — D500 Iron deficiency anemia secondary to blood loss (chronic): Secondary | ICD-10-CM | POA: Insufficient documentation

## 2023-07-05 DIAGNOSIS — R9389 Abnormal findings on diagnostic imaging of other specified body structures: Secondary | ICD-10-CM | POA: Diagnosis present

## 2023-07-05 DIAGNOSIS — E039 Hypothyroidism, unspecified: Secondary | ICD-10-CM | POA: Diagnosis not present

## 2023-07-05 LAB — CBC
HCT: 29.9 % — ABNORMAL LOW (ref 36.0–46.0)
Hemoglobin: 9.6 g/dL — ABNORMAL LOW (ref 12.0–15.0)
MCH: 29.5 pg (ref 26.0–34.0)
MCHC: 32.1 g/dL (ref 30.0–36.0)
MCV: 92 fL (ref 80.0–100.0)
Platelets: 205 10*3/uL (ref 150–400)
RBC: 3.25 MIL/uL — ABNORMAL LOW (ref 3.87–5.11)
RDW: 15.5 % (ref 11.5–15.5)
WBC: 8.3 10*3/uL (ref 4.0–10.5)
nRBC: 0 % (ref 0.0–0.2)

## 2023-07-05 LAB — MAGNESIUM: Magnesium: 1.4 mg/dL — ABNORMAL LOW (ref 1.7–2.4)

## 2023-07-05 LAB — BASIC METABOLIC PANEL
Anion gap: 9 (ref 5–15)
BUN: 21 mg/dL (ref 8–23)
CO2: 21 mmol/L — ABNORMAL LOW (ref 22–32)
Calcium: 8.5 mg/dL — ABNORMAL LOW (ref 8.9–10.3)
Chloride: 111 mmol/L (ref 98–111)
Creatinine, Ser: 0.97 mg/dL (ref 0.44–1.00)
GFR, Estimated: 54 mL/min — ABNORMAL LOW (ref >60)
Glucose, Bld: 143 mg/dL — ABNORMAL HIGH (ref 70–99)
Potassium: 3 mmol/L — ABNORMAL LOW (ref 3.5–5.1)
Sodium: 141 mmol/L (ref 135–145)

## 2023-07-05 LAB — GLUCOSE, CAPILLARY
Glucose-Capillary: 112 mg/dL — ABNORMAL HIGH (ref 70–99)
Glucose-Capillary: 139 mg/dL — ABNORMAL HIGH (ref 70–99)

## 2023-07-05 LAB — PROCALCITONIN: Procalcitonin: <0.1 ng/mL

## 2023-07-05 LAB — HEMOGLOBIN A1C
Hgb A1c MFr Bld: 6.2 % — ABNORMAL HIGH (ref 4.8–5.6)
Mean Plasma Glucose: 131.24 mg/dL

## 2023-07-05 LAB — TSH: TSH: 1.439 u[IU]/mL (ref 0.350–4.500)

## 2023-07-05 LAB — TROPONIN I (HIGH SENSITIVITY)
Troponin I (High Sensitivity): 3 ng/L (ref <18)
Troponin I (High Sensitivity): 8 ng/L (ref <18)

## 2023-07-05 LAB — BRAIN NATRIURETIC PEPTIDE: B Natriuretic Peptide: 81.9 pg/mL (ref 0.0–100.0)

## 2023-07-05 MED ORDER — POTASSIUM CHLORIDE 10 MEQ/100ML IV SOLN
10.0000 meq | INTRAVENOUS | Status: AC
  Filled 2023-07-05 (×2): qty 100

## 2023-07-05 MED ORDER — INSULIN ASPART 100 UNIT/ML IJ SOLN: 0.0000 [IU] | Freq: Every day | INTRAMUSCULAR | Status: DC

## 2023-07-05 MED ORDER — INSULIN ASPART 100 UNIT/ML IJ SOLN: 0.0000 [IU] | Freq: Three times a day (TID) | INTRAMUSCULAR | Status: DC

## 2023-07-05 MED ORDER — ACETAMINOPHEN 325 MG PO TABS: 650.0000 mg | ORAL_TABLET | Freq: Four times a day (QID) | ORAL | Status: DC | PRN

## 2023-07-05 MED ORDER — POTASSIUM CHLORIDE 10 MEQ/100ML IV SOLN
10.0000 meq | INTRAVENOUS | Status: AC
  Administered 2023-07-05 (×2): 10 meq via INTRAVENOUS

## 2023-07-05 MED ORDER — ATORVASTATIN CALCIUM 40 MG PO TABS
40.0000 mg | ORAL_TABLET | Freq: Every day | ORAL | Status: DC
  Administered 2023-07-05: 40 mg via ORAL
  Filled 2023-07-05: qty 1

## 2023-07-05 MED ORDER — ACETAMINOPHEN 650 MG RE SUPP: 650.0000 mg | Freq: Four times a day (QID) | RECTAL | Status: DC | PRN

## 2023-07-05 MED ORDER — MAGNESIUM SULFATE 2 GM/50ML IV SOLN
2.0000 g | Freq: Once | INTRAVENOUS | Status: AC
  Administered 2023-07-05: 2 g via INTRAVENOUS
  Filled 2023-07-05: qty 50

## 2023-07-05 MED ORDER — SODIUM CHLORIDE 0.9% FLUSH
3.0000 mL | Freq: Two times a day (BID) | INTRAVENOUS | Status: DC
  Administered 2023-07-05 – 2023-07-06 (×2): 3 mL via INTRAVENOUS

## 2023-07-05 MED ORDER — LEVOTHYROXINE SODIUM 50 MCG PO TABS
50.0000 ug | ORAL_TABLET | Freq: Every day | ORAL | Status: DC
  Administered 2023-07-06: 50 ug via ORAL
  Filled 2023-07-05: qty 1

## 2023-07-05 MED ORDER — ALBUTEROL SULFATE (2.5 MG/3ML) 0.083% IN NEBU: 2.5000 mg | INHALATION_SOLUTION | Freq: Four times a day (QID) | RESPIRATORY_TRACT | Status: DC | PRN

## 2023-07-05 MED ORDER — POTASSIUM CHLORIDE CRYS ER 20 MEQ PO TBCR
40.0000 meq | EXTENDED_RELEASE_TABLET | ORAL | Status: AC
  Administered 2023-07-05: 40 meq via ORAL
  Filled 2023-07-05 (×2): qty 2

## 2023-07-05 MED ORDER — ENOXAPARIN SODIUM 40 MG/0.4ML IJ SOSY
40.0000 mg | PREFILLED_SYRINGE | INTRAMUSCULAR | Status: DC
  Administered 2023-07-05: 40 mg via SUBCUTANEOUS
  Filled 2023-07-05: qty 0.4

## 2023-07-05 MED ORDER — SODIUM CHLORIDE 0.9 % IV BOLUS
500.0000 mL | Freq: Once | INTRAVENOUS | Status: AC
  Administered 2023-07-05: 500 mL via INTRAVENOUS

## 2023-07-05 MED ORDER — POTASSIUM CHLORIDE 10 MEQ/100ML IV SOLN: 10.0000 meq | INTRAVENOUS | Status: AC

## 2023-07-05 NOTE — ED Notes (Signed)
ED TO INPATIENT HANDOFF REPORT  ED Nurse Name and Phone #: Donny Pique ,RN [phone]   S Name/Age/Gender Lovett Sox Crean 89 y.o. female Room/Bed: 011C/011C  Code Status   Code Status: Prior  Home/SNF/Other Home Patient oriented to: self, place, time, and situation Is this baseline? Yes   Triage Complete: Triage complete  Chief Complaint Bigeminy [I49.8]  Triage Note Pt arrived via ems to the er for the initial call of low blood sugar. BG was 130. Pt began to have runs of bigeminy with ems that is causing pt nausea, vomiting, and dizziness. Pt lives at independent living facility. Pt is alert and oriented x 4. All other vitals stable. 4mf zofran given by ems    Allergies Allergies  Allergen Reactions   Tape Other (See Comments)    SKIN WILL TEAR EASILY!!   Aspirin Other (See Comments) and Palpitations    Heart flutters  Heart flutter    Level of Care/Admitting Diagnosis ED Disposition     ED Disposition  Admit   Condition  --   Comment  Hospital Area: [facility] [[id number]]  Level of Care: Telemetry Cardiac [103]  May place patient in observation at [facility] or [facility] if equivalent level of care is available:: No  Covid Evaluation: Asymptomatic - no recent exposure (last 10 days) testing not required  Diagnosis: Bigeminy [208305]  Admitting Physician: Clydie Braun [[id number]]  Attending Physician: Clydie Braun [[id number]]          B Medical/Surgery History Past Medical History:  Diagnosis Date   Diabetes mellitus without complication (HCC)    Hypertension    Thyroid disease    Past Surgical History:  Procedure Laterality Date   ABDOMINAL HYSTERECTOMY     FOOT SURGERY Right 1960     A IV Location/Drains/Wounds Patient Lines/Drains/Airways Status     Active Line/Drains/Airways     Name Placement date Placement time Site Days   Peripheral IV 07/05/23 20 G Left Antecubital 07/05/23  --  Antecubital  less than 1             Intake/Output Last 24 hours No intake or output data in the 24 hours ending 07/05/23 1503  Labs/Imaging Results for orders placed or performed during the hospital encounter of 07/05/23 (from the past 48 hour(s))  TSH     Status: None   Collection Time: 07/05/23 10:49 AM  Result Value Ref Range   TSH 1.439 0.350 - 4.500 uIU/mL    Comment: Performed by a 3rd Generation assay with a functional sensitivity of <=0.01 uIU/mL. Performed at [facility], [address], [place], [place] [address]   Magnesium     Status: Abnormal   Collection Time: 07/05/23 10:49 AM  Result Value Ref Range   Magnesium 1.4 (L) 1.7 - 2.4 mg/dL    Comment: Performed at [facility], [address] [place], [place] [address]  Basic metabolic panel     Status: Abnormal   Collection Time: 07/05/23 10:49 AM  Result Value Ref Range   Sodium 141 135 - 145 mmol/L   Potassium 3.0 (L) 3.5 - 5.1 mmol/L   Chloride 111 98 - 111 mmol/L   CO2 21 (L) 22 - 32 mmol/L   Glucose, Bld 143 (H) 70 - 99 mg/dL    Comment: Glucose reference range applies only to samples taken after fasting for at least 8 hours.   BUN 21 8 - 23 mg/dL   Creatinine, Ser  0.97 0.44 - 1.00 mg/dL   Calcium 8.5 (L) 8.9 - 10.3 mg/dL   GFR, Estimated 54 (L) >60 mL/min    Comment: (NOTE) Calculated using the CKD-EPI Creatinine Equation (2021)    Anion gap 9 5 - 15    Comment: Performed at [facility], [address] [place], [place] [address]  CBC     Status: Abnormal   Collection Time: 07/05/23 10:49 AM  Result Value Ref Range   WBC 8.3 4.0 - 10.5 K/uL   RBC 3.25 (L) 3.87 - 5.11 MIL/uL   Hemoglobin 9.6 (L) 12.0 - 15.0 g/dL   HCT 60.4 (L) 54.0 - 98.1 %   MCV 92.0 80.0 - 100.0 fL   MCH 29.5 26.0 - 34.0 pg   MCHC 32.1 30.0 - 36.0 g/dL   RDW 19.1 47.8 - 29.5 %   Platelets 205 150 - 400 K/uL   nRBC 0.0 0.0 - 0.2 %    Comment: Performed at [facility], [address] [place], [place] [address]  Troponin I (High  Sensitivity)     Status: None   Collection Time: 07/05/23 10:49 AM  Result Value Ref Range   Troponin I (High Sensitivity) 3 <18 ng/L    Comment: (NOTE) Elevated high sensitivity troponin I (hsTnI) values and significant  changes across serial measurements may suggest ACS but many other  chronic and acute conditions are known to elevate hsTnI results.  Refer to the "Links" section for chest pain algorithms and additional  guidance. Performed at [facility], [address], [place], [place] [address]    DG Chest Portable 1 View  Result Date: 07/05/2023 CLINICAL DATA:  Weakness EXAM: PORTABLE CHEST 1 VIEW COMPARISON:  Chest radiograph dated 11/10/2019 FINDINGS: Patient is rotated to the left. Low lung volumes. Bilateral interstitial and bibasilar patchy opacities. No pleural effusion or pneumothorax. The heart size and mediastinal contours are within normal limits. Severe degenerative changes of the shoulders. IMPRESSION: Bilateral interstitial and bibasilar patchy opacities, which may represent combination of pulmonary edema and/or pneumonia. Electronically Signed   By: Agustin Cree M.D.   On: 07/05/2023 14:01    Pending Labs Unresulted Labs (From admission, onward)    None       Vitals/Pain Today's Vitals   07/05/23 1017 07/05/23 1018 07/05/23 1022 07/05/23 1023  BP:      Pulse: 60  (!) 57   Resp:   13   Temp:    98 F (36.7 C)  TempSrc:    Oral  SpO2:   100%   PainSc:  0-No pain      Isolation Precautions No active isolations  Medications Medications  potassium chloride 10 mEq in 100 mL IVPB (has no administration in time range)  magnesium sulfate IVPB 2 g 50 mL (has no administration in time range)  potassium chloride 10 mEq in 100 mL IVPB (has no administration in time range)  sodium chloride 0.9 % bolus 500 mL (500 mLs Intravenous New Bag/Given 07/05/23 1029)    Mobility walks with device     Focused Assessments    R Recommendations: See Admitting  Provider Note  Report given to:   Additional Notes: Patient lives in independent living, family is supportive and at bedside. Patient has no teeth and requires soft diet.

## 2023-07-05 NOTE — ED Triage Notes (Addendum)
Pt arrived via ems to the er for the initial call of low blood sugar. BG was 130. Pt began to have runs of bigeminy with ems that is causing pt nausea, vomiting, and dizziness. Pt lives at independent living facility. Pt is alert and oriented x 4. All other vitals stable. 4mf zofran given by ems

## 2023-07-05 NOTE — ED Provider Notes (Signed)
[place] EMERGENCY DEPARTMENT AT [facility] Provider Note   CSN: [id number] Arrival date & time: 07/05/23  1007     History  Chief Complaint  Patient presents with   Nausea   Dizziness    TIERNEY BEHL is a 89 y.o. female.   Dizziness Patient reportedly was feeling dizzy this morning.  EMS called for hypoglycemia but was not hypoglycemic.  Found to have her to be in a bigeminy.  Was diaphoretic with it.  Went back to a normal sinus rhythm and felt better.  Has had nausea.  Doing well yesterday.  Did not eat breakfast this morning.    Past Medical History:  Diagnosis Date   Diabetes mellitus without complication (HCC)    Hypertension    Thyroid disease     Home Medications Prior to Admission medications   Medication Sig Start Date End Date Taking? Authorizing Provider  Accu-Chek FastClix Lancets MISC  09/19/18   [provider]  ACCU-CHEK GUIDE test strip 1 each by Other route. 2-3 02/01/19   [provider]  Alcohol Swabs (ALCOHOL PREP) PADS  06/04/18   [provider]  atorvastatin (LIPITOR) 40 MG tablet Take 40 mg by mouth at bedtime.  04/01/18   [provider]  B-D UF III MINI PEN NEEDLES 31G X 5 MM MISC See admin instructions. 05/29/18   [provider]  BD INSULIN SYRINGE U/F 31G X 5/16" 0.5 ML MISC  10/31/18   [provider]  Blood Glucose Monitoring Suppl (ACCU-CHEK GUIDE) w/Device KIT  09/19/18   [provider]  Incontinence Supply Disposable (CLEANSING CLOTHS FLUSHABLE) MISC  06/04/18   [provider]  insulin aspart protamine - aspart (NOVOLOG MIX 70/30 FLEXPEN) (70-30) 100 UNIT/ML FlexPen Inject 0.1 mLs (10 Units total) into the skin 2 (two) times daily with a meal. 09/03/20   Sheikh, Omair Latif, DO  isosorbide mononitrate (ISMO,MONOKET) 10 MG tablet Take 10 mg by mouth 2 (two) times daily. 05/23/18   [provider]  levothyroxine (SYNTHROID) 50 MCG tablet Take 50 mcg by  mouth daily before breakfast.  11/07/18   [provider]  lisinopril (ZESTRIL) 20 MG tablet Take 1 tablet (20 mg total) by mouth daily. 09/04/20   Marguerita Merles Latif, DO  metFORMIN (GLUCOPHAGE) 500 MG tablet Take 500 mg by mouth daily. 12/07/21   [provider]  nitroGLYCERIN (NITROSTAT) 0.4 MG SL tablet Place 0.4 mg under the tongue every 5 (five) minutes as needed for chest pain.  04/03/18   [provider]  Olopatadine HCl 0.2 % SOLN Place 1 drop into both eyes daily.  06/01/18   [provider]  UNABLE TO FIND Disposable Underwear Ultra-L 06/04/18   [provider]  verapamil (CALAN-SR) 240 MG CR tablet Take 240 mg by mouth daily.    [provider]      Allergies    Tape and Aspirin    Review of Systems   Review of Systems  Neurological:  Positive for dizziness.    Physical Exam Updated Vital Signs BP (!) 103/53   Pulse (!) 57   Temp 98 F (36.7 C) (Oral)   Resp 13   SpO2 100%  Physical Exam Vitals reviewed.  HENT:     Head: Normocephalic.  Cardiovascular:     Rate and Rhythm: Regular rhythm.  Chest:     Chest wall: No tenderness.  Abdominal:     Tenderness: There is no abdominal tenderness.  Musculoskeletal:  General: No tenderness.     Cervical back: Neck supple.     Right lower leg: No edema.     Left lower leg: No edema.  Neurological:     Mental Status: She is alert.     ED Results / Procedures / Treatments   Labs (all labs ordered are listed, but only abnormal results are displayed) Labs Reviewed  MAGNESIUM - Abnormal; Notable for the following components:      Result Value   Magnesium 1.4 (*)    All other components within normal limits  BASIC METABOLIC PANEL - Abnormal; Notable for the following components:   Potassium 3.0 (*)    CO2 21 (*)    Glucose, Bld 143 (*)    Calcium 8.5 (*)    GFR, Estimated 54 (*)    All other components within normal limits  CBC - Abnormal; Notable for the  following components:   RBC 3.25 (*)    Hemoglobin 9.6 (*)    HCT 29.9 (*)    All other components within normal limits  TSH  TROPONIN I (HIGH SENSITIVITY)  TROPONIN I (HIGH SENSITIVITY)    EKG EKG Interpretation Date/Time:  Thursday July 05 2023 10:15:10 EDT Ventricular Rate:  60 PR Interval:  119 QRS Duration:  88 QT Interval:  491 QTC Calculation: 491 R Axis:   -21  Text Interpretation: Sinus rhythm Borderline short PR interval LVH by voltage Borderline prolonged QT interval Confirmed by Benjiman Core [phone]) on 07/05/2023 10:23:27 AM  Radiology DG Chest Portable 1 View  Result Date: 07/05/2023 CLINICAL DATA:  Weakness EXAM: PORTABLE CHEST 1 VIEW COMPARISON:  Chest radiograph dated 11/10/2019 FINDINGS: Patient is rotated to the left. Low lung volumes. Bilateral interstitial and bibasilar patchy opacities. No pleural effusion or pneumothorax. The heart size and mediastinal contours are within normal limits. Severe degenerative changes of the shoulders. IMPRESSION: Bilateral interstitial and bibasilar patchy opacities, which may represent combination of pulmonary edema and/or pneumonia. Electronically Signed   By: Agustin Cree M.D.   On: 07/05/2023 14:01    Procedures Procedures    Medications Ordered in ED Medications  potassium chloride 10 mEq in 100 mL IVPB (has no administration in time range)  magnesium sulfate IVPB 2 g 50 mL (has no administration in time range)  sodium chloride 0.9 % bolus 500 mL (500 mLs Intravenous New Bag/Given 07/05/23 1029)    ED Course/ Medical Decision Making/ A&P                                 Medical Decision Making Amount and/or Complexity of Data Reviewed Labs: ordered. Radiology: ordered.  Risk Prescription drug management.   Patient presented with reported weakness and nausea.  Reportedly was in a bigeminy and was diaphoretic and feeling weak with it.  However upon arrival was back in sinus rhythm.  Differential diagnosis  does include arrhythmia, electrolyte abnormalities.  Infection felt less likely.  Will continue to monitor.  Will get basic blood work.  Reviewed most recent discharge note.  Family member now here.  States patient fell and hit the back of her head.  Will now get imaging.  Does have hypokalemia and hypomagnesemia.  Potentially the cause of the arrhythmia.  With the significant arrhythmia with near syncope and lightheadedness I feel she benefit from admission to the hospital for more acute supplementation and cardiac monitoring.  Head CT and cervical spine CT reviewed and independently  interpreted.  No fracture or bleed seen.  Awaiting radiology read however.  Will admit to internal medicine.           Final Clinical Impression(s) / ED Diagnoses Final diagnoses:  Hypokalemia  Hypomagnesemia  Ventricular bigeminy    Rx / DC Orders ED Discharge Orders     None         Benjiman Core, MD 07/05/23 1409

## 2023-07-05 NOTE — H&P (Signed)
History and Physical    Patient: Jennifer Zhang:[id number] DOB: 04-18-1926 DOA: 07/05/2023 DOS: the patient was seen and examined on 07/05/2023 PCP: [name], MD  Patient coming from: Home via EMS  Chief Complaint:  Chief Complaint  Patient presents with   Nausea   Dizziness   HPI: Jennifer Zhang is a 89 y.o. female with medical history significant of hypertension, hypothyroidism, and diabetes mellitus type 2 who presents with complaints of dizziness.  She had gotten up this morning to have breakfast and reported having a headache on the top of her head.   She was sitting in the chair when she felt dizzy and fell back in the chair. Patient's caregiver was present.  Patient states that she did not pass out although it was noted as brief syncopal episode on the EMS run sheet.  Thereafter patient got nauseated, vomited, and was diaphoretic.  Denied having any chest pain, palpitations, abdominal pain, or dysuria.  She had not had any recent medication changes or recent sick contacts.  She reports having a very mild intermittent cough.  With EMS patient was noted to be in bigeminy.  Her daughter questioned if her blood sugar was low, but it was noted to be 131 when checked with EMS.  Additional history obtained from the daughter included that the patient has had similar episodes like this in the past, but the last occurred was over a year ago.  Also reported her having episode of vomiting yesterday as well.  Patient  In the emergency department patient was noted to be in sinus rhythm.  Labs significant for hemoglobin 9.6, potassium 3, magnesium 1.4,  high-sensitivity troponin 3, and TSH 1.439. Chest x-ray noted bilateral interstitial bibasilar patchy opacities concerning for pulmonary edema and/or pneumonia. Patient had been ordered 2 g of magnesium sulfate IV and potassium chloride 20 mEq IV.   Review of Systems: As mentioned in the history of present illness. All other systems  reviewed and are negative. Past Medical History:  Diagnosis Date   Diabetes mellitus without complication (HCC)    Hypertension    Thyroid disease    Past Surgical History:  Procedure Laterality Date   ABDOMINAL HYSTERECTOMY     FOOT SURGERY Right 1960   Social History:  reports that she has quit smoking. She has quit using smokeless tobacco. She reports that she does not drink alcohol and does not use drugs.  Allergies  Allergen Reactions   Tape Other (See Comments)    SKIN WILL TEAR EASILY!!   Aspirin Other (See Comments) and Palpitations    Heart flutters  Heart flutter    Family History  Problem Relation Age of Onset   Cancer Mother    Hypertension Mother    Migraines Mother    Breast cancer Mother    Diabetes Brother     Prior to Admission medications   Medication Sig Start Date End Date Taking? Authorizing Provider  Accu-Chek FastClix Lancets MISC  09/19/18   [provider]  ACCU-CHEK GUIDE test strip 1 each by Other route. 2-3 02/01/19   [provider]  Alcohol Swabs (ALCOHOL PREP) PADS  06/04/18   [provider]  atorvastatin (LIPITOR) 40 MG tablet Take 40 mg by mouth at bedtime.  04/01/18   [provider]  B-D UF III MINI PEN NEEDLES 31G X 5 MM MISC See admin instructions. 05/29/18   [provider]  BD INSULIN SYRINGE U/F 31G X 5/16" 0.5 ML MISC  10/31/18  [provider]  Blood Glucose Monitoring Suppl (ACCU-CHEK GUIDE) w/Device KIT  09/19/18   [provider]  Incontinence Supply Disposable (CLEANSING CLOTHS FLUSHABLE) MISC  06/04/18   [provider]  insulin aspart protamine - aspart (NOVOLOG MIX 70/30 FLEXPEN) (70-30) 100 UNIT/ML FlexPen Inject 0.1 mLs (10 Units total) into the skin 2 (two) times daily with a meal. 09/03/20   Sheikh, Omair Latif, DO  isosorbide mononitrate (ISMO,MONOKET) 10 MG tablet Take 10 mg by mouth 2 (two) times daily. 05/23/18   [provider]  levothyroxine  (SYNTHROID) 50 MCG tablet Take 50 mcg by mouth daily before breakfast.  11/07/18   [provider]  lisinopril (ZESTRIL) 20 MG tablet Take 1 tablet (20 mg total) by mouth daily. 09/04/20   Marguerita Merles Latif, DO  metFORMIN (GLUCOPHAGE) 500 MG tablet Take 500 mg by mouth daily. 12/07/21   [provider]  nitroGLYCERIN (NITROSTAT) 0.4 MG SL tablet Place 0.4 mg under the tongue every 5 (five) minutes as needed for chest pain.  04/03/18   [provider]  Olopatadine HCl 0.2 % SOLN Place 1 drop into both eyes daily.  06/01/18   [provider]  UNABLE TO FIND Disposable Underwear Ultra-L 06/04/18   [provider]  verapamil (CALAN-SR) 240 MG CR tablet Take 240 mg by mouth daily.    [provider]    Physical Exam: Vitals:   07/05/23 1022 07/05/23 1023 07/05/23 1512 07/05/23 1601  BP:    136/63  Pulse: (!) 57   68  Resp: 13   16  Temp:  98 F (36.7 C) (!) 97.5 F (36.4 C) 97.9 F (36.6 C)  TempSrc:  Oral Oral Oral  SpO2: 100%   100%   Exam  Constitutional: Elderly female who appears to be in no acute distress at this time Eyes: PERRL, lids and conjunctivae normal ENMT: Mucous membranes are moist.  Edentulous  neck: normal, supple, no significant JVD Respiratory: Bibasilar crackles.  Normal respiratory effort Cardiovascular: Regular rate and rhythm, no murmurs / rubs / gallops. No extremity edema. 2+ pedal pulses.   Abdomen: no tenderness, no masses palpated.  Bowel sounds positive.  Musculoskeletal: no clubbing / cyanosis. No joint deformity upper and lower extremities. Good ROM, no contractures. Normal muscle tone.  Skin: no rashes, lesions, ulcers. No induration Neurologic: CN 2-12 grossly intact.  Able to move all extremities.  In all 4.  Psychiatric: Normal judgment and insight. Alert and oriented x 3. Normal mood.   Data Reviewed:  EKG revealed Sinus rhythm at 60 bpm with borderline QT prolongation. Reviewed labs, imaging, and  pertinent records as documented.  Assessment and Plan:   Near syncopal/syncopal episode Prior to arrival.  In the emergency department patient blood pressures appear to be stable.  Daughter makes note patient has had previous symptoms like this in the past.  Noted to have orthostatic dizziness on review of records. -Admit to a cardiac telemetry bed -Check orthostatic vital signs -Check echocardiogram  Bigeminy Prior to arrival.  Patient was noted to be in bigeminy in route with EMS.  Last echocardiogram noted EF to be 55 to 60% with indeterminate diastolic parameters back in 08/2020. -Correct electrolyte abnormalities.  Goal potassium at least 4 and magnesium at least 2 -Follow-up echocardiogram -Follow-up telemetry overnight  Hypokalemia Hypomagnesemia Acute.  Initial potassium was 3 and magnesium 1.4.  Patient had been ordered potassium chloride 20 meq IV and 2 g of magnesium sulfate.   -Given additional 40  mEq of potassium chloride p.o. -Continue to monitor and replace as needed  Abnormal chest x-ray Patient reports having a cough that is mild and intermittent.  On physical exam does not appear to be fluid overloaded.  Chest x-ray noted bilateral interstitial bibasilar patchy opacities concerning for pulmonary edema and/or pneumonia.  Patient possibly aspirated after having near syncopal episode with reports of vomiting thereafter. -Check BNP and procalcitonin  Essential hypertension On admission blood pressures 103/53 -136/63. -Resume home blood pressure medications once able to be verified  Normocytic anemia On admission hemoglobin noted to be 9.6 with relatively stable vital signs.  Last available hemoglobin was 10.6 back in 2021. -Recheck CBC in AM.  Uncomplicated diabetes mellitus type 2 On admission patient's glucose was noted to be 143.  Home medication regimen includes metformin. -Hypoglycemic protocols -Hold metformin -CBGs before every meal with sensitive  SSI -Resume home insulin regimen once able to be verified  Hyperlipidemia -Continue atorvastatin  Hypothyroidism TSH was 1.439. -Continue levothyroxine  DVT prophylaxis: Lovenox Advance Care Planning:   Code Status: Full Code    Consults: None  Family Communication: Daughter updated at bedside  Severity of Illness: The appropriate patient status for this patient is OBSERVATION. Observation status is judged to be reasonable and necessary in order to provide the required intensity of service to ensure the patient's safety. The patient's presenting symptoms, physical exam findings, and initial radiographic and laboratory data in the context of their medical condition is felt to place them at decreased risk for further clinical deterioration. Furthermore, it is anticipated that the patient will be medically stable for discharge from the hospital within 2 midnights of admission.   Author: Clydie Braun, MD 07/05/2023 4:20 PM  For on call review [email or web address].

## 2023-07-06 ENCOUNTER — Other Ambulatory Visit (HOSPITAL_COMMUNITY): Payer: Self-pay

## 2023-07-06 DIAGNOSIS — R55 Syncope and collapse: Secondary | ICD-10-CM | POA: Diagnosis not present

## 2023-07-06 LAB — CBC
HCT: 27.9 % — ABNORMAL LOW (ref 36.0–46.0)
Hemoglobin: 9 g/dL — ABNORMAL LOW (ref 12.0–15.0)
MCH: 28.5 pg (ref 26.0–34.0)
MCHC: 32.3 g/dL (ref 30.0–36.0)
MCV: 88.3 fL (ref 80.0–100.0)
Platelets: 190 10*3/uL (ref 150–400)
RBC: 3.16 MIL/uL — ABNORMAL LOW (ref 3.87–5.11)
RDW: 15.5 % (ref 11.5–15.5)
WBC: 9 10*3/uL (ref 4.0–10.5)
nRBC: 0 % (ref 0.0–0.2)

## 2023-07-06 LAB — BASIC METABOLIC PANEL
Anion gap: 8 (ref 5–15)
BUN: 16 mg/dL (ref 8–23)
CO2: 23 mmol/L (ref 22–32)
Calcium: 8.7 mg/dL — ABNORMAL LOW (ref 8.9–10.3)
Chloride: 106 mmol/L (ref 98–111)
Creatinine, Ser: 0.82 mg/dL (ref 0.44–1.00)
GFR, Estimated: >60 mL/min (ref >60)
Glucose, Bld: 87 mg/dL (ref 70–99)
Potassium: 4.2 mmol/L (ref 3.5–5.1)
Sodium: 137 mmol/L (ref 135–145)

## 2023-07-06 LAB — GLUCOSE, CAPILLARY
Glucose-Capillary: 81 mg/dL (ref 70–99)
Glucose-Capillary: 128 mg/dL — ABNORMAL HIGH (ref 70–99)

## 2023-07-06 LAB — MAGNESIUM: Magnesium: 2 mg/dL (ref 1.7–2.4)

## 2023-07-06 MED ORDER — VERAPAMIL HCL ER 120 MG PO TBCR
120.0000 mg | EXTENDED_RELEASE_TABLET | Freq: Every day | ORAL | Status: DC
  Administered 2023-07-06: 120 mg via ORAL
  Filled 2023-07-06: qty 1

## 2023-07-06 MED ORDER — LISINOPRIL 10 MG PO TABS
10.0000 mg | ORAL_TABLET | Freq: Every day | ORAL | 0 refills | Status: DC
Start: 2023-07-06 — End: 2023-08-23
  Filled 2023-07-06: qty 30, 30d supply, fill #0

## 2023-07-06 NOTE — Plan of Care (Signed)
  Problem: Education: Goal: Knowledge of General Education information will improve Description: Including pain rating scale, medication(s)/side effects and non-pharmacologic comfort measures Outcome: Progressing   Problem: Health Behavior/Discharge Planning: Goal: Ability to manage health-related needs will improve Outcome: Progressing   Problem: Clinical Measurements: Goal: Ability to maintain clinical measurements within normal limits will improve Outcome: Progressing Goal: Will remain free from infection Outcome: Progressing Goal: Diagnostic test results will improve Outcome: Progressing Goal: Respiratory complications will improve Outcome: Progressing Goal: Cardiovascular complication will be avoided Outcome: Progressing   Problem: Activity: Goal: Risk for activity intolerance will decrease Outcome: Progressing   Problem: Nutrition: Goal: Adequate nutrition will be maintained Outcome: Progressing   Problem: Coping: Goal: Level of anxiety will decrease Outcome: Progressing   Problem: Elimination: Goal: Will not experience complications related to bowel motility Outcome: Progressing Goal: Will not experience complications related to urinary retention Outcome: Progressing   Problem: Safety: Goal: Ability to remain free from injury will improve Outcome: Progressing   Problem: Pain Managment: Goal: General experience of comfort will improve Outcome: Progressing   Problem: Skin Integrity: Goal: Risk for impaired skin integrity will decrease Outcome: Progressing   Problem: Education: Goal: Ability to describe self-care measures that may prevent or decrease complications (Diabetes Survival Skills Education) will improve Outcome: Progressing Goal: Individualized Educational Video(s) Outcome: Progressing   Problem: Health Behavior/Discharge Planning: Goal: Ability to identify and utilize available resources and services will improve Outcome: Progressing Goal:  Ability to manage health-related needs will improve Outcome: Progressing   Problem: Metabolic: Goal: Ability to maintain appropriate glucose levels will improve Outcome: Progressing   Problem: Nutritional: Goal: Maintenance of adequate nutrition will improve Outcome: Progressing Goal: Progress toward achieving an optimal weight will improve Outcome: Progressing   Problem: Skin Integrity: Goal: Risk for impaired skin integrity will decrease Outcome: Progressing   Problem: Tissue Perfusion: Goal: Adequacy of tissue perfusion will improve Outcome: Progressing

## 2023-07-06 NOTE — Progress Notes (Signed)
Mobility Specialist Progress Note:   07/06/23 1110  Therapy Vitals  BP (!) 153/77  Mobility  Activity Transferred to/from [facility];Ambulated with assistance in hallway  Level of Assistance Contact guard assist, steadying assist  Assistive Device Front wheel walker  Distance Ambulated (ft) 60 ft  Activity Response Tolerated well  Mobility Referral Yes  $Mobility charge 1 Mobility  Mobility Specialist Start Time (ACUTE ONLY) 1104  Mobility Specialist Stop Time (ACUTE ONLY) 1118  Mobility Specialist Time Calculation (min) (ACUTE ONLY) 14 min    Pre Mobility: 91 HR During Mobility: 112 HR Post Mobility:  91 HR  Pt received in chair, requesting to use BSC. Void successful. Agreeable to ambulation. Asymptomatic w/ no complaints throughout. Pt left in bed with call bell and all needs met. Bed alarm on.  [place]ante Earlene Plater Mobility Specialist Please contact via Special educational needs teacher or Rehab office at [phone]

## 2023-07-06 NOTE — Progress Notes (Signed)
Patient and daughter Pearly verbalized understanding of dc instructions.all belongings given to patient.

## 2023-07-06 NOTE — TOC CM/SW Note (Signed)
Transition of Care [facility]) - Inpatient Brief Assessment   Patient Details  Name: Jennifer Zhang MRN: [id number] Date of Birth: [birth year]  [facility] CM/SW Contact:    Gala Lewandowsky, RN Phone Number: 07/06/2023, 2:47 PM   Clinical Narrative: Patient presented for near syncope. PTA patient was from home with personal care services. Daughter at the bedside and both are agreeable to home health services. Family has no preference and is agreeable to [facility]. Referral submitted to [facility] and will reach out to the daughter for visit times. No further needs identified at this time.    Transition of Care Asessment: Insurance and Status: Insurance coverage has been reviewed Patient has primary care physician: Yes Home environment has been reviewed: reviewed  Prior/Current Home Services: No current home services Social Determinants of Health Reivew: SDOH reviewed no interventions necessary Readmission risk has been reviewed: Yes Transition of care needs: no transition of care needs at this time

## 2023-07-06 NOTE — Discharge Summary (Signed)
Physician Discharge Summary  Jennifer Zhang NUU:[id number] DOB: 20-Apr-1926 DOA: 07/05/2023  PCP: Nona Dell, NP  Admit date: 07/05/2023 Discharge date: 07/06/2023  Admitted From: home Discharge disposition: home   Recommendations for Outpatient Follow-Up:   DO NOT PLACE ON hydrochlorothiazide Home health Adjust BP meds as needed BMP at next office visit   Discharge Diagnosis:   Principal Problem:   Near syncope Active Problems:   Bigeminy   Hypokalemia   Hypomagnesemia   Abnormal chest x-ray   HTN (hypertension)   Normocytic anemia   Type 2 diabetes mellitus without complication (HCC)   Hyperlipidemia   Hypothyroidism    Discharge Condition: Improved.  Diet recommendation: Low sodium, heart healthy.   Wound care: None.  Code status: Full.   History of Present Illness:   Jennifer Zhang is a 89 y.o. female with medical history significant of hypertension, hypothyroidism, and diabetes mellitus type 2 who presents with complaints of dizziness.  She had gotten up this morning to have breakfast and reported having a headache on the top of her head.   She was sitting in the chair when she felt dizzy and fell back in the chair. Patient's caregiver was present.  Patient states that she did not pass out although it was noted as brief syncopal episode on the EMS run sheet.  Thereafter patient got nauseated, vomited, and was diaphoretic.  Denied having any chest pain, palpitations, abdominal pain, or dysuria.  She had not had any recent medication changes or recent sick contacts.  She reports having a very mild intermittent cough.  With EMS patient was noted to be in bigeminy.  Her daughter questioned if her blood sugar was low, but it was noted to be 131 when checked with EMS.  Additional history obtained from the daughter included that the patient has had similar episodes like this in the past, but the last occurred was over a year ago.  Also reported her  having episode of vomiting yesterday as well.  Patient   In the emergency department patient was noted to be in sinus rhythm.  Labs significant for hemoglobin 9.6, potassium 3, magnesium 1.4,  high-sensitivity troponin 3, and TSH 1.439. Chest x-ray noted bilateral interstitial bibasilar patchy opacities concerning for pulmonary edema and/or pneumonia. Patient had been ordered 2 g of magnesium sulfate IV and potassium chloride 20 mEq IV.   Hospital Course by Problem:   Near syncopal/syncopal episode Due to BP Meds-- have adjusted and d/c'd hydrochlorothiazide -can do outpatient echo Worked with PT-- no further dizziness   Bigeminy -from electrolytes being low -resolved after replacement   Hypokalemia Hypomagnesemia -low due to hydrochlorothiazide -stop medication    Abnormal chest x-ray Patient reports having a cough that is mild and intermittent.  On physical exam does not appear to be fluid overloaded -no sign of infection or volume overload   Essential hypertension STOP hydrochlorothiazide -resume other meds   Normocytic anemia -follow up outpatient    Uncomplicated diabetes mellitus type 2 -diet controlled -not on any meds  Hyperlipidemia -Continue atorvastatin   Hypothyroidism TSH was 1.439. -Continue levothyroxine    Medical Consultants:   none   Discharge Exam:   Vitals:   07/06/23 1110 07/06/23 1432  BP: (!) 153/77 123/64  Pulse: 69   Resp: 16   Temp: 98.5 F (36.9 C)   SpO2: 100%    Vitals:   07/06/23 0820 07/06/23 1106 07/06/23 1110 07/06/23 1432  BP: 131/87 (!) 149/128 Marland Kitchen)  153/77 123/64  Pulse: 76  69   Resp: 18  16   Temp: 98.4 F (36.9 C)  98.5 F (36.9 C)   TempSrc: Oral  Oral   SpO2: 100%  100%     General exam: Appears calm and comfortable   The results of significant diagnostics from this hospitalization (including imaging, microbiology, ancillary and laboratory) are listed below for reference.     Procedures and  Diagnostic Studies:   CT Cervical Spine Wo Contrast  Result Date: 07/05/2023 CLINICAL DATA:  89 year old female with nausea and dizziness. Weakness. EXAM: CT CERVICAL SPINE WITHOUT CONTRAST TECHNIQUE: Multidetector CT imaging of the cervical spine was performed without intravenous contrast. Multiplanar CT image reconstructions were also generated. RADIATION DOSE REDUCTION: This exam was performed according to the departmental dose-optimization program which includes automated exposure control, adjustment of the mA and/or kV according to patient size and/or use of iterative reconstruction technique. COMPARISON:  Head CT today.  Cervical spine radiographs 04/12/2004. FINDINGS: Alignment: Chronic straightening of cervical lordosis. Cervicothoracic junction alignment is within normal limits. Bilateral posterior element alignment is within normal limits. Skull base and vertebrae: Bone mineralization is within normal limits for age. Visualized skull base is intact. No atlanto-occipital dissociation. C1 and C2 appear intact and aligned. No acute osseous abnormality identified. Soft tissues and spinal canal: No prevertebral fluid or swelling. No visible canal hematoma. Calcified cervical carotid atherosclerosis and tortuosity. Disc levels: Chronic degenerative ligamentous hypertrophy about the odontoid. Chronic cervical disc and endplate degeneration maximal at C3-C4. Multilevel suspected mild degenerative cervical spinal stenosis. Upper chest: Grossly intact visible upper thoracic levels. Calcified aortic atherosclerosis. Mild apical lung scarring. IMPRESSION: 1. No acute traumatic injury identified in the cervical spine. 2. Chronic cervical spine degeneration with suspected mild spinal stenosis. 3. Calcified carotid and Aortic Atherosclerosis (ICD10-I70.0). Electronically Signed   By: Odessa Fleming M.D.   On: 07/05/2023 16:21   CT HEAD WO CONTRAST ( )  Result Date: 07/05/2023 CLINICAL DATA:  89 year old female with  nausea and dizziness. Weakness. EXAM: CT HEAD WITHOUT CONTRAST TECHNIQUE: Contiguous axial images were obtained from the base of the skull through the vertex without intravenous contrast. RADIATION DOSE REDUCTION: This exam was performed according to the departmental dose-optimization program which includes automated exposure control, adjustment of the mA and/or kV according to patient size and/or use of iterative reconstruction technique. COMPARISON:  Brain MRI and Head CT 08/30/2020. FINDINGS: Brain: Chronic bilateral cerebellar infarcts, the largest is in the right SCA territory. Chronic and confluent bilateral cerebral white matter hypodensity. Chronic heterogeneity in the deep gray matter nuclei and brainstem. Stable cerebral volume since 2021. No midline shift, ventriculomegaly, mass effect, evidence of mass lesion, intracranial hemorrhage or evidence of cortically based acute infarction. Vascular: Calcified atherosclerosis at the skull base. No suspicious intracranial vascular hyperdensity. Skull: Chronic hyperostosis and benign appearing left superior convexity exostosis (normal variant series 4, image 64). No acute or suspicious osseous lesion. Sinuses/Orbits: Visualized paranasal sinuses and mastoids are clear. Other: No acute orbit or scalp soft tissue finding. IMPRESSION: 1. No acute intracranial abnormality. 2. Stable since 2021 non contrast CT appearance of chronic small and medium-sized vessel ischemia. Electronically Signed   By: Odessa Fleming M.D.   On: 07/05/2023 16:19   DG Chest Portable 1 View  Result Date: 07/05/2023 CLINICAL DATA:  Weakness EXAM: PORTABLE CHEST 1 VIEW COMPARISON:  Chest radiograph dated 11/10/2019 FINDINGS: Patient is rotated to the left. Low lung volumes. Bilateral interstitial and bibasilar patchy opacities. No pleural effusion or pneumothorax.  The heart size and mediastinal contours are within normal limits. Severe degenerative changes of the shoulders. IMPRESSION: Bilateral  interstitial and bibasilar patchy opacities, which may represent combination of pulmonary edema and/or pneumonia. Electronically Signed   By: Agustin Cree M.D.   On: 07/05/2023 14:01     Labs:   Basic Metabolic Panel: Recent Labs  Lab 07/05/23 1049 07/06/23 0536  NA 141 137  K 3.0* 4.2  CL 111 106  CO2 21* 23  GLUCOSE 143* 87  BUN 21 16  CREATININE 0.97 0.82  CALCIUM 8.5* 8.7*  MG 1.4* 2.0   GFR CrCl cannot be calculated (Unknown ideal weight.). Liver Function Tests: No results for input(s): "AST", "ALT", "ALKPHOS", "BILITOT", "PROT", "ALBUMIN" in the last 168 hours. No results for input(s): "LIPASE", "AMYLASE" in the last 168 hours. No results for input(s): "AMMONIA" in the last 168 hours. Coagulation profile No results for input(s): "INR", "PROTIME" in the last 168 hours.  CBC: Recent Labs  Lab 07/05/23 1049 07/06/23 0536  WBC 8.3 9.0  HGB 9.6* 9.0*  HCT 29.9* 27.9*  MCV 92.0 88.3  PLT 205 190   Cardiac Enzymes: No results for input(s): "CKTOTAL", "CKMB", "CKMBINDEX", "TROPONINI" in the last 168 hours. BNP: Invalid input(s): "POCBNP" CBG: Recent Labs  Lab 07/05/23 1558 07/05/23 2137 07/06/23 0817 07/06/23 1158  GLUCAP 112* 139* 81 128*   D-Dimer No results for input(s): "DDIMER" in the last 72 hours. Hgb A1c Recent Labs    07/05/23 2024  HGBA1C 6.2*   Lipid Profile No results for input(s): "CHOL", "HDL", "LDLCALC", "TRIG", "CHOLHDL", "LDLDIRECT" in the last 72 hours. Thyroid function studies Recent Labs    07/05/23 1049  TSH 1.439   Anemia work up No results for input(s): "VITAMINB12", "FOLATE", "FERRITIN", "TIBC", "IRON", "RETICCTPCT" in the last 72 hours. Microbiology No results found for this or any previous visit (from the past 240 hour(s)).   Discharge Instructions:   Discharge Instructions     Diet - low sodium heart healthy   Complete by: As directed    Discharge instructions   Complete by: As directed    Home health DO NOT  TAKE hydrochlorothiazide-- it causes your electrolytes (K and Mg) to drop   Increase activity slowly   Complete by: As directed       Allergies as of 07/06/2023       Reactions   Tape Other (See Comments)   SKIN WILL TEAR EASILY!!   Aspirin Other (See Comments), Palpitations   Heart flutters Heart flutter        Medication List     STOP taking these medications    Accu-Chek FastClix Lancets Misc   Accu-Chek Guide test strip Generic drug: glucose blood   Accu-Chek Guide w/Device Kit   Alcohol Prep Pads   B-D UF III MINI PEN NEEDLES 31G X 5 MM Misc Generic drug: Insulin Pen Needle   BD Insulin Syringe U/F 31G X 5/16" 0.5 ML Misc Generic drug: Insulin Syringe-Needle U-100   lisinopril-hydrochlorothiazide 20-25 MG tablet Commonly known as: ZESTORETIC       TAKE these medications    atorvastatin 40 MG tablet Commonly known as: LIPITOR Take 40 mg by mouth at bedtime.   Cleansing Cloths Flushable Misc   isosorbide mononitrate 10 MG tablet Commonly known as: ISMO Take 10 mg by mouth 2 (two) times daily.   levothyroxine 50 MCG tablet Commonly known as: SYNTHROID Take 50 mcg by mouth daily before breakfast.   lisinopril 10 MG tablet Commonly  known as: ZESTRIL Take 1 tablet (10 mg total) by mouth daily.   metFORMIN 500 MG tablet Commonly known as: GLUCOPHAGE Take 500 mg by mouth daily.   nitroGLYCERIN 0.4 MG SL tablet Commonly known as: NITROSTAT Place 0.4 mg under the tongue every 5 (five) minutes as needed for chest pain.   Olopatadine HCl 0.2 % Soln Place 1 drop into both eyes daily.   verapamil 240 MG CR tablet Commonly known as: CALAN-SR Take 240 mg by mouth daily.          Time coordinating discharge: 45 min  Signed:  Joseph Art DO  [facility] 07/06/2023, 2:40 PM

## 2023-07-06 NOTE — Evaluation (Signed)
Physical Therapy Evaluation Patient Details Name: Jennifer Zhang MRN: [id number] DOB: September 02, 1926 Today's Date: 07/06/2023  History of Present Illness  89 yo female presents to [facility] on 10/3 initial work up for hypoglycemia, but was runs of bigeminy with ems that is causing pt nausea, vomiting, and dizziness. PMH includes HTN, hypothyroidism, DMII.  Clinical Impression   Pt presents with generalized weakness, impaired balance with chronic reliance on RW and history of falls, impaired activity tolerance. Pt to benefit from acute PT to address deficits. Pt ambulated short hallway distance with use of RW and close guard for safety, requires cues for form/safety throughout. PT lives alone at independent living apartment with aide and daughter assist as needed, would recommend increased support from daughter at d/c as well as HHPT. PT to progress mobility as tolerated, and will continue to follow acutely.      HR 80-128 bpm during session, RN And MD made aware. Pt denies dizziness during gait, just reporting fatigue, but when MD asks pt after PT eval, pt endorses dizziness while OOB. Orthostatic vitals negative for orthostatic hypotension this am     If plan is discharge home, recommend the following: A little help with walking and/or transfers;A little help with bathing/dressing/bathroom   Can travel by private vehicle        Equipment Recommendations None recommended by PT  Recommendations for Other Services       Functional Status Assessment Patient has had a recent decline in their functional status and demonstrates the ability to make significant improvements in function in a reasonable and predictable amount of time.     Precautions / Restrictions Precautions Precautions: Fall Restrictions Weight Bearing Restrictions: No      Mobility  Bed Mobility Overal bed mobility: Needs Assistance Bed Mobility: Supine to Sit     Supine to sit: Supervision, HOB elevated           Transfers Overall transfer level: Needs assistance Equipment used: Rolling walker (2 wheels) Transfers: Sit to/from Stand Sit to Stand: Contact guard assist           General transfer comment: safety    Ambulation/Gait Ambulation/Gait assistance: Contact guard assist Gait Distance (Feet): 65 Feet Assistive device: Rolling walker (2 wheels) Gait Pattern/deviations: Step-through pattern, Decreased stride length, Trunk flexed Gait velocity: decr     General Gait Details: close guard for safety, cues for upright posture and proximity of RW  Stairs            Wheelchair Mobility     Tilt Bed    Modified Rankin (Stroke Patients Only)       Balance Overall balance assessment: Needs assistance, History of Falls Sitting-balance support: No upper extremity supported Sitting balance-Leahy Scale: Fair     Standing balance support: Bilateral upper extremity supported, During functional activity, Reliant on assistive device for balance Standing balance-Leahy Scale: Poor                               Pertinent Vitals/Pain Pain Assessment Pain Assessment: No/denies pain    Home Living Family/patient expects to be discharged to:: Private residence Living Arrangements: Alone Available Help at Discharge: Family;Personal care attendant Type of Home: Independent living facility Home Access: Elevator       Home Layout: One level Home Equipment: Rollator (4 wheels);BSC/3in1;Tub bench;Rolling Walker (2 wheels)      Prior Function Prior Level of Function : Needs assist  Mobility Comments: uses RW "when I go out", and rollator in the house ADLs Comments: Pt has an aide come in every day, who assists with cleaning, bathing, and food prep     Extremity/Trunk Assessment   Upper Extremity Assessment Upper Extremity Assessment: Defer to OT evaluation    Lower Extremity Assessment Lower Extremity Assessment: Generalized weakness     Cervical / Trunk Assessment Cervical / Trunk Assessment: Normal  Communication   Communication Communication: Hearing impairment  Cognition Arousal: Alert Behavior During Therapy: WFL for tasks assessed/performed Overall Cognitive Status: Within Functional Limits for tasks assessed                                          General Comments General comments (skin integrity, edema, etc.): HR 80-128 bpm during session, RN And MD made aware. Pt denies dizziness during gait, just reporting fatigue, but when MD asks pt after PT eval, pt endorses dizziness while OOB    Exercises     Assessment/Plan    PT Assessment Patient needs continued PT services  PT Problem List Decreased strength;Decreased mobility;Decreased activity tolerance;Decreased balance;Decreased knowledge of use of DME;Pain;Cardiopulmonary status limiting activity;Decreased safety awareness       PT Treatment Interventions DME instruction;Therapeutic activities;Gait training;Therapeutic exercise;Patient/family education;Stair training;Balance training;Functional mobility training    PT Goals (Current goals can be found in the Care Plan section)  Acute Rehab PT Goals Patient Stated Goal: home PT Goal Formulation: With patient Time For Goal Achievement: 07/20/23 Potential to Achieve Goals: Good    Frequency Min 1X/week     Co-evaluation               AM-PAC PT "6 Clicks" Mobility  Outcome Measure Help needed turning from your back to your side while in a flat bed without using bedrails?: A Little Help needed moving from lying on your back to sitting on the side of a flat bed without using bedrails?: A Little Help needed moving to and from a bed to a chair (including a wheelchair)?: A Little Help needed standing up from a chair using your arms (e.g., wheelchair or bedside chair)?: A Little Help needed to walk in hospital room?: A Little Help needed climbing 3-5 steps with a railing? : A  Little 6 Click Score: 18    End of Session   Activity Tolerance: Patient tolerated treatment well Patient left: in chair;with chair alarm set;with call bell/phone within reach Nurse Communication: Mobility status PT Visit Diagnosis: Other abnormalities of gait and mobility (R26.89);Muscle weakness (generalized) (M62.81)    Time: 4098-1191 PT Time Calculation (min) (ACUTE ONLY): 18 min   Charges:   PT Evaluation $PT Eval Low Complexity: 1 Low   PT General Charges $$ ACUTE PT VISIT: 1 Visit         Marye Round, PT DPT Acute Rehabilitation Services Secure Chat Preferred  Office [phone]   Guy Toney E Christain [place] 07/06/2023, 9:50 AM

## 2023-07-27 ENCOUNTER — Ambulatory Visit: Payer: 59 | Admitting: Podiatry

## 2023-08-17 ENCOUNTER — Inpatient Hospital Stay (HOSPITAL_COMMUNITY): Payer: 59

## 2023-08-17 ENCOUNTER — Emergency Department (HOSPITAL_COMMUNITY): Payer: 59

## 2023-08-17 ENCOUNTER — Inpatient Hospital Stay (HOSPITAL_COMMUNITY)
Admission: EM | Admit: 2023-08-17 | Discharge: 2023-08-23 | DRG: 884 | Disposition: A | Discharge status: Skilled Nursing Facility | Payer: 59 | Attending: Infectious Diseases | Admitting: Infectious Diseases

## 2023-08-17 ENCOUNTER — Encounter (HOSPITAL_COMMUNITY): Payer: Self-pay | Admitting: Emergency Medicine

## 2023-08-17 ENCOUNTER — Other Ambulatory Visit: Payer: Self-pay

## 2023-08-17 DIAGNOSIS — Z9109 Other allergy status, other than to drugs and biological substances: Secondary | ICD-10-CM

## 2023-08-17 DIAGNOSIS — F0150 Vascular dementia without behavioral disturbance: Principal | ICD-10-CM | POA: Diagnosis present

## 2023-08-17 DIAGNOSIS — Z7984 Long term (current) use of oral hypoglycemic drugs: Secondary | ICD-10-CM | POA: Diagnosis not present

## 2023-08-17 DIAGNOSIS — R4182 Altered mental status, unspecified: Secondary | ICD-10-CM | POA: Diagnosis not present

## 2023-08-17 DIAGNOSIS — M47812 Spondylosis without myelopathy or radiculopathy, cervical region: Secondary | ICD-10-CM | POA: Diagnosis present

## 2023-08-17 DIAGNOSIS — F03918 Unspecified dementia, unspecified severity, with other behavioral disturbance: Secondary | ICD-10-CM | POA: Diagnosis not present

## 2023-08-17 DIAGNOSIS — F0280 Dementia in other diseases classified elsewhere without behavioral disturbance: Secondary | ICD-10-CM | POA: Diagnosis present

## 2023-08-17 DIAGNOSIS — R32 Unspecified urinary incontinence: Secondary | ICD-10-CM | POA: Diagnosis present

## 2023-08-17 DIAGNOSIS — Z886 Allergy status to analgesic agent status: Secondary | ICD-10-CM | POA: Diagnosis not present

## 2023-08-17 DIAGNOSIS — W19XXXA Unspecified fall, initial encounter: Secondary | ICD-10-CM | POA: Diagnosis present

## 2023-08-17 DIAGNOSIS — E119 Type 2 diabetes mellitus without complications: Secondary | ICD-10-CM | POA: Diagnosis present

## 2023-08-17 DIAGNOSIS — Z79899 Other long term (current) drug therapy: Secondary | ICD-10-CM | POA: Diagnosis not present

## 2023-08-17 DIAGNOSIS — G3183 Dementia with Lewy bodies: Secondary | ICD-10-CM | POA: Diagnosis present

## 2023-08-17 DIAGNOSIS — E039 Hypothyroidism, unspecified: Secondary | ICD-10-CM | POA: Diagnosis present

## 2023-08-17 DIAGNOSIS — Z1152 Encounter for screening for COVID-19: Secondary | ICD-10-CM

## 2023-08-17 DIAGNOSIS — M25561 Pain in right knee: Secondary | ICD-10-CM | POA: Diagnosis present

## 2023-08-17 DIAGNOSIS — I10 Essential (primary) hypertension: Secondary | ICD-10-CM | POA: Diagnosis present

## 2023-08-17 DIAGNOSIS — F05 Delirium due to known physiological condition: Secondary | ICD-10-CM | POA: Diagnosis present

## 2023-08-17 DIAGNOSIS — R410 Disorientation, unspecified: Secondary | ICD-10-CM

## 2023-08-17 DIAGNOSIS — Y92009 Unspecified place in unspecified non-institutional (private) residence as the place of occurrence of the external cause: Principal | ICD-10-CM

## 2023-08-17 DIAGNOSIS — D649 Anemia, unspecified: Secondary | ICD-10-CM | POA: Diagnosis present

## 2023-08-17 DIAGNOSIS — Z79890 Hormone replacement therapy (postmenopausal): Secondary | ICD-10-CM | POA: Diagnosis not present

## 2023-08-17 DIAGNOSIS — R4781 Slurred speech: Secondary | ICD-10-CM | POA: Diagnosis present

## 2023-08-17 DIAGNOSIS — E785 Hyperlipidemia, unspecified: Secondary | ICD-10-CM | POA: Diagnosis present

## 2023-08-17 DIAGNOSIS — R569 Unspecified convulsions: Secondary | ICD-10-CM

## 2023-08-17 DIAGNOSIS — E876 Hypokalemia: Secondary | ICD-10-CM | POA: Diagnosis present

## 2023-08-17 DIAGNOSIS — F0390 Unspecified dementia without behavioral disturbance: Secondary | ICD-10-CM | POA: Diagnosis not present

## 2023-08-17 LAB — CBC WITH DIFFERENTIAL/PLATELET
Abs Immature Granulocytes: 0.03 10*3/uL (ref 0.00–0.07)
Basophils Absolute: 0 10*3/uL (ref 0.0–0.1)
Basophils Relative: 0 %
Eosinophils Absolute: 0 10*3/uL (ref 0.0–0.5)
Eosinophils Relative: 0 %
HCT: 32.4 % — ABNORMAL LOW (ref 36.0–46.0)
Hemoglobin: 10.6 g/dL — ABNORMAL LOW (ref 12.0–15.0)
Immature Granulocytes: 0 %
Lymphocytes Relative: 18 %
Lymphs Abs: 1.7 10*3/uL (ref 0.7–4.0)
MCH: 29.7 pg (ref 26.0–34.0)
MCHC: 32.7 g/dL (ref 30.0–36.0)
MCV: 90.8 fL (ref 80.0–100.0)
Monocytes Absolute: 0.8 10*3/uL (ref 0.1–1.0)
Monocytes Relative: 8 %
Neutro Abs: 7 10*3/uL (ref 1.7–7.7)
Neutrophils Relative %: 74 %
Platelets: 232 10*3/uL (ref 150–400)
RBC: 3.57 MIL/uL — ABNORMAL LOW (ref 3.87–5.11)
RDW: 15.7 % — ABNORMAL HIGH (ref 11.5–15.5)
WBC: 9.6 10*3/uL (ref 4.0–10.5)
nRBC: 0 % (ref 0.0–0.2)

## 2023-08-17 LAB — PROTIME-INR
INR: 1 (ref 0.8–1.2)
Prothrombin Time: 13.8 s (ref 11.4–15.2)

## 2023-08-17 LAB — URINALYSIS, ROUTINE W REFLEX MICROSCOPIC
Bilirubin Urine: NEGATIVE
Glucose, UA: NEGATIVE mg/dL
Hgb urine dipstick: NEGATIVE
Ketones, ur: 5 mg/dL — AB
Leukocytes,Ua: NEGATIVE
Nitrite: NEGATIVE
Protein, ur: 100 mg/dL — AB
Specific Gravity, Urine: 1.019 (ref 1.005–1.030)
pH: 5 (ref 5.0–8.0)

## 2023-08-17 LAB — COMPREHENSIVE METABOLIC PANEL
ALT: 18 U/L (ref 0–44)
AST: 22 U/L (ref 15–41)
Albumin: 3.9 g/dL (ref 3.5–5.0)
Alkaline Phosphatase: 40 U/L (ref 38–126)
Anion gap: 13 (ref 5–15)
BUN: 13 mg/dL (ref 8–23)
CO2: 29 mmol/L (ref 22–32)
Calcium: 8.7 mg/dL — ABNORMAL LOW (ref 8.9–10.3)
Chloride: 100 mmol/L (ref 98–111)
Creatinine, Ser: 0.99 mg/dL (ref 0.44–1.00)
GFR, Estimated: 53 mL/min — ABNORMAL LOW (ref >60)
Glucose, Bld: 113 mg/dL — ABNORMAL HIGH (ref 70–99)
Potassium: 2.8 mmol/L — ABNORMAL LOW (ref 3.5–5.1)
Sodium: 142 mmol/L (ref 135–145)
Total Bilirubin: 0.9 mg/dL (ref <1.2)
Total Protein: 6.6 g/dL (ref 6.5–8.1)

## 2023-08-17 LAB — T4, FREE: Free T4: 1.24 ng/dL — ABNORMAL HIGH (ref 0.61–1.12)

## 2023-08-17 LAB — ETHANOL: Alcohol, Ethyl (B): <10 mg/dL (ref <10)

## 2023-08-17 LAB — VITAMIN B12: Vitamin B-12: 258 pg/mL (ref 180–914)

## 2023-08-17 LAB — TROPONIN I (HIGH SENSITIVITY)
Troponin I (High Sensitivity): 18 ng/L — ABNORMAL HIGH (ref <18)
Troponin I (High Sensitivity): 21 ng/L — ABNORMAL HIGH (ref <18)

## 2023-08-17 LAB — MAGNESIUM: Magnesium: 1.1 mg/dL — ABNORMAL LOW (ref 1.7–2.4)

## 2023-08-17 LAB — SARS CORONAVIRUS 2 BY RT PCR: SARS Coronavirus 2 by RT PCR: NEGATIVE

## 2023-08-17 LAB — CK: Total CK: 441 U/L — ABNORMAL HIGH (ref 38–234)

## 2023-08-17 LAB — AMMONIA: Ammonia: <10 umol/L (ref 9–35)

## 2023-08-17 LAB — PHOSPHORUS: Phosphorus: 4.2 mg/dL (ref 2.5–4.6)

## 2023-08-17 LAB — TSH: TSH: 1.742 u[IU]/mL (ref 0.350–4.500)

## 2023-08-17 MED ORDER — POTASSIUM CHLORIDE 20 MEQ PO PACK: 40.0000 meq | PACK | Freq: Two times a day (BID) | ORAL | Status: DC

## 2023-08-17 MED ORDER — ACETAMINOPHEN 650 MG RE SUPP: 650.0000 mg | Freq: Four times a day (QID) | RECTAL | Status: DC | PRN

## 2023-08-17 MED ORDER — ATORVASTATIN CALCIUM 40 MG PO TABS
40.0000 mg | ORAL_TABLET | Freq: Every day | ORAL | Status: DC
  Administered 2023-08-17 – 2023-08-22 (×6): 40 mg via ORAL
  Filled 2023-08-17 (×6): qty 1

## 2023-08-17 MED ORDER — POTASSIUM CHLORIDE 10 MEQ/100ML IV SOLN: 10.0000 meq | INTRAVENOUS | Status: DC

## 2023-08-17 MED ORDER — VERAPAMIL HCL ER 240 MG PO TBCR
240.0000 mg | EXTENDED_RELEASE_TABLET | Freq: Every day | ORAL | Status: DC
  Administered 2023-08-18 – 2023-08-23 (×6): 240 mg via ORAL
  Filled 2023-08-17 (×6): qty 1

## 2023-08-17 MED ORDER — POTASSIUM CHLORIDE CRYS ER 20 MEQ PO TBCR
40.0000 meq | EXTENDED_RELEASE_TABLET | Freq: Once | ORAL | Status: DC
Start: 2023-08-17 — End: 2023-08-17
  Filled 2023-08-17: qty 2

## 2023-08-17 MED ORDER — POTASSIUM CHLORIDE 20 MEQ PO PACK
40.0000 meq | PACK | Freq: Once | ORAL | Status: AC
  Administered 2023-08-17: 40 meq via ORAL
  Filled 2023-08-17: qty 2

## 2023-08-17 MED ORDER — LEVOTHYROXINE SODIUM 50 MCG PO TABS
50.0000 ug | ORAL_TABLET | Freq: Every day | ORAL | Status: DC
  Administered 2023-08-18 – 2023-08-23 (×6): 50 ug via ORAL
  Filled 2023-08-17 (×6): qty 1

## 2023-08-17 MED ORDER — ACETAMINOPHEN 325 MG PO TABS
650.0000 mg | ORAL_TABLET | Freq: Four times a day (QID) | ORAL | Status: DC | PRN
Start: 2023-08-17 — End: 2023-08-23

## 2023-08-17 MED ORDER — MAGNESIUM SULFATE 4 GM/100ML IV SOLN
4.0000 g | Freq: Once | INTRAVENOUS | Status: AC
  Administered 2023-08-17: 4 g via INTRAVENOUS
  Filled 2023-08-17: qty 100

## 2023-08-17 MED ORDER — SENNOSIDES-DOCUSATE SODIUM 8.6-50 MG PO TABS: 1.0000 | ORAL_TABLET | Freq: Every evening | ORAL | Status: DC | PRN

## 2023-08-17 MED ORDER — POTASSIUM CHLORIDE 20 MEQ PO PACK
40.0000 meq | PACK | Freq: Two times a day (BID) | ORAL | Status: AC
  Administered 2023-08-17: 40 meq via ORAL
  Filled 2023-08-17: qty 2

## 2023-08-17 MED ORDER — LISINOPRIL 10 MG PO TABS
10.0000 mg | ORAL_TABLET | Freq: Every day | ORAL | Status: DC
  Administered 2023-08-18 – 2023-08-23 (×6): 10 mg via ORAL
  Filled 2023-08-17 (×6): qty 1

## 2023-08-17 MED ORDER — HEPARIN SODIUM (PORCINE) 5000 UNIT/ML IJ SOLN
5000.0000 [IU] | Freq: Three times a day (TID) | INTRAMUSCULAR | Status: DC
  Administered 2023-08-17 – 2023-08-23 (×17): 5000 [IU] via SUBCUTANEOUS
  Filled 2023-08-17 (×17): qty 1

## 2023-08-17 NOTE — ED Triage Notes (Signed)
Pt BIB [facility] with reports of mechanical fall and c/o right knee pain. Unknown what time pt fell.Family reports pt is "altered today."

## 2023-08-17 NOTE — ED Notes (Signed)
Called lab to add PT INR to specimen in lab

## 2023-08-17 NOTE — ED Provider Notes (Signed)
[facility] AT [facility] Provider Note   CSN: [id number] Arrival date & time: 08/17/23  1122     History  Chief Complaint  Patient presents with   Jennifer Zhang is a 89 y.o. female with PMH as listed below who presents with fall, AMS. Patient lives alone. Reportedly, family found patient on the floor today and reports that she has been more confused for the last few weeks. She states it is 27. She endorses pain in her right knee after a fall yesterday. She states she doesn't remember why she fell. She denies any other symptoms and is otherwise cooperative and pleasant. Denies headache, neck/back pain, CP, abd pain, urinary sxs. Noted to have scattered ecchymoses on BL shins. HDS though mildly hypertensive on arrival.   Past Medical History:  Diagnosis Date   Diabetes mellitus without complication (HCC)    Hypertension    Thyroid disease        Home Medications Prior to Admission medications   Medication Sig Start Date End Date Taking? Authorizing Provider  atorvastatin (LIPITOR) 40 MG tablet Take 40 mg by mouth at bedtime.  04/01/18   [provider]  Incontinence Supply Disposable (CLEANSING CLOTHS FLUSHABLE) MISC  06/04/18   [provider]  isosorbide mononitrate (ISMO,MONOKET) 10 MG tablet Take 10 mg by mouth 2 (two) times daily. 05/23/18   [provider]  levothyroxine (SYNTHROID) 50 MCG tablet Take 50 mcg by mouth daily before breakfast.  11/07/18   [provider]  lisinopril (ZESTRIL) 10 MG tablet Take 1 tablet (10 mg total) by mouth daily. 07/06/23 07/05/24  Joseph Art, DO  metFORMIN (GLUCOPHAGE) 500 MG tablet Take 500 mg by mouth daily. 12/07/21   [provider]  nitroGLYCERIN (NITROSTAT) 0.4 MG SL tablet Place 0.4 mg under the tongue every 5 (five) minutes as needed for chest pain.  04/03/18   [provider]  Olopatadine HCl 0.2 % SOLN Place 1 drop into both eyes daily.   06/01/18   [provider]  verapamil (CALAN-SR) 240 MG CR tablet Take 240 mg by mouth daily.    [provider]      Allergies    Tape and Aspirin    Review of Systems   Review of Systems A 10 point review of systems was performed and is negative unless otherwise reported in HPI.  Physical Exam Updated Vital Signs BP (!) 162/75   Pulse 78   Temp (!) 97.5 F (36.4 C) (Oral)   Resp (!) 23   Ht 5' (1.524 m)   Wt 68 kg   SpO2 100%   BMI 29.29 kg/m  Physical Exam  PRIMARY SURVEY  Airway Airway intact  Breathing Bilateral breath sounds  Circulation Carotid/femoral pulses 2+ intact bilaterally  GCS E =  4 V =  4 M =  6 Total = 14 (for disorientation)  Environment All clothes removed      SECONDARY SURVEY  Gen: -NAD  HEENT: -Head: NCAT. Scalp is clear of lacerations or wounds. Skull is clear of deformities or depressions. Has a hard mass/knot on L temporal scalp that feels chronic, patient states is chronic.  -Forehead: Normal -Midface: Stable -Eyes: No visible injury to eyelids or eye, PERRL, EOMI -Nose: No gross deformities -Mouth: No injuries to lips, tongue or teeth. No trismus or malposition -Ears: No auricular hematoma -Neck: Trachea is midline, no distended neck veins  Chest: -No tenderness, deformities, bruising or crepitus  to clavicles or chest -Normal chest expansion -Normal heart sounds, S1/S2 normal, no m/r/g -No wheezes, rales, rhonchi  Abdomen: -No tenderness, bruising or penetrating injury  Pelvis: -Pelvis is stable and non-tender  Extremities: Right Upper Extremity: -No point tenderness, deformity or other signs of injury -Radial pulse intact RUE, cap refill good -Normal sensation -Normal ROM, good strength Left Upper Extremity: -No point tenderness, deformity or other signs of injury -Radial pulse intact LUE, cap refill good -Normal sensation -Normal ROM, good strength Right Lower Extremity: -Mild TTP diffusely to R knee.  Scattered ecchymoses on lower leg.  - No deformity or other signs of injury -DP intact RLE -Normal sensation -Normal ROM, good strength Left Lower Extremity: -Scattered ecchymoses on lower leg.  -No point tenderness, deformity or other signs of injury -DP intact LLE -Normal sensation -Normal ROM, good strength  Back/Spine: -No midline C, T or L spine tenderness or step-offs  Other: N/A     ED Results / Procedures / Treatments   Labs (all labs ordered are listed, but only abnormal results are displayed) Labs Reviewed  CBC WITH DIFFERENTIAL/PLATELET - Abnormal; Notable for the following components:      Result Value   RBC 3.57 (*)    Hemoglobin 10.6 (*)    HCT 32.4 (*)    RDW 15.7 (*)    All other components within normal limits  COMPREHENSIVE METABOLIC PANEL - Abnormal; Notable for the following components:   Potassium 2.8 (*)    Glucose, Bld 113 (*)    Calcium 8.7 (*)    GFR, Estimated 53 (*)    All other components within normal limits  CK - Abnormal; Notable for the following components:   Total CK 441 (*)    All other components within normal limits  T4, FREE - Abnormal; Notable for the following components:   Free T4 1.24 (*)    All other components within normal limits  TROPONIN I (HIGH SENSITIVITY) - Abnormal; Notable for the following components:   Troponin I (High Sensitivity) 18 (*)    All other components within normal limits  SARS CORONAVIRUS 2 BY RT PCR  URINE CULTURE  ETHANOL  AMMONIA  TSH  URINALYSIS, ROUTINE W REFLEX MICROSCOPIC  RAPID URINE DRUG SCREEN, HOSP PERFORMED  PROTIME-INR  MAGNESIUM  PHOSPHORUS  VITAMIN B12  TROPONIN I (HIGH SENSITIVITY)    EKG EKG Interpretation Date/Time:  Friday August 17 2023 11:42:00 EST Ventricular Rate:  75 PR Interval:  120 QRS Duration:  84 QT Interval:  436 QTC Calculation: 487 R Axis:   -25  Text Interpretation: Sinus rhythm LVH with secondary repolarization abnormality Borderline prolonged QT  interval Confirmed by Vivi Barrack [phone]) on 08/17/2023 3:18:58 PM  Radiology CT Head Wo Contrast  Result Date: 08/17/2023 CLINICAL DATA:  Provided history: Head trauma, minor. Mental status change, unknown cause. Neck trauma. Additional history: Mechanical fall. EXAM: CT HEAD WITHOUT CONTRAST CT CERVICAL SPINE WITHOUT CONTRAST TECHNIQUE: Multidetector CT imaging of the head and cervical spine was performed following the standard protocol without intravenous contrast. Multiplanar CT image reconstructions of the cervical spine were also generated. RADIATION DOSE REDUCTION: This exam was performed according to the departmental dose-optimization program which includes automated exposure control, adjustment of the mA and/or kV according to patient size and/or use of iterative reconstruction technique. COMPARISON:  Head CT 07/05/2023.  Cervical spine CT 07/05/2023. FINDINGS: CT HEAD FINDINGS Brain: Generalized cerebral atrophy. Mild cerebellar volume loss. Prominence of the ventricles and sulci appears commensurate. Patchy and confluent  hypoattenuation within the cerebral white matter, nonspecific but compatible chronic small vessel ischemic disease. Chronic small vessel ischemic changes also present within/about the bilateral deep gray nuclei. Multiple chronic infarcts again noted within the bilateral cerebellar hemispheres. There is no acute intracranial hemorrhage. No demarcated cortical infarct. No extra-axial fluid collection. No evidence of an intracranial mass. No midline shift. Vascular: No hyperdense vessel.  Atherosclerotic calcifications. Skull: No calvarial fracture. 2.6 x 0.9 cm broad-based bony protrusion projecting outward from the left parietal calvarium, unchanged from the prior head CT of 07/05/2023 and likely reflecting an osteoma. Sinuses/Orbits: No mass or acute finding within the imaged orbits. No significant paranasal sinus disease. CT CERVICAL SPINE FINDINGS Alignment: No significant  spondylolisthesis. Skull base and vertebrae: The basion-dental and atlanto-dental intervals are maintained.No evidence of acute fracture to the cervical spine. Schmorl node within the T4 superior endplate. Soft tissues and spinal canal: No prevertebral fluid or swelling. No visible canal hematoma. Disc levels: Cervical spondylosis with multilevel disc space narrowing, disc bulges/central disc protrusions, posterior disc osteophyte complexes and uncovertebral hypertrophy. Disc space narrowing is greatest at C3-C4, C5-C6 and C7-T1 (advanced at these levels). Multilevel spinal canal stenosis. Most notably, at C3-C4 a posterior disc osteophyte complex contributes to at least moderate spinal canal stenosis, and at C4-C5 a central disc protrusion contributes to apparent moderate spinal canal stenosis. Multilevel bony neural foraminal narrowing. Upper chest: No consolidation within the imaged lung apices. No visible pneumothorax. Biapical pulmonary scarring. IMPRESSION: CT head: 1. No evidence of an acute intracranial abnormality. 2. Parenchymal atrophy, chronic small vessel ischemic disease and chronic infarcts, as described. CT cervical spine: 1. No evidence of an acute cervical spine fracture. 2. Cervical spondylosis as described. Electronically Signed   By: Jackey Loge D.O.   On: 08/17/2023 13:01   CT Cervical Spine Wo Contrast  Result Date: 08/17/2023 CLINICAL DATA:  Provided history: Head trauma, minor. Mental status change, unknown cause. Neck trauma. Additional history: Mechanical fall. EXAM: CT HEAD WITHOUT CONTRAST CT CERVICAL SPINE WITHOUT CONTRAST TECHNIQUE: Multidetector CT imaging of the head and cervical spine was performed following the standard protocol without intravenous contrast. Multiplanar CT image reconstructions of the cervical spine were also generated. RADIATION DOSE REDUCTION: This exam was performed according to the departmental dose-optimization program which includes automated exposure  control, adjustment of the mA and/or kV according to patient size and/or use of iterative reconstruction technique. COMPARISON:  Head CT 07/05/2023.  Cervical spine CT 07/05/2023. FINDINGS: CT HEAD FINDINGS Brain: Generalized cerebral atrophy. Mild cerebellar volume loss. Prominence of the ventricles and sulci appears commensurate. Patchy and confluent hypoattenuation within the cerebral white matter, nonspecific but compatible chronic small vessel ischemic disease. Chronic small vessel ischemic changes also present within/about the bilateral deep gray nuclei. Multiple chronic infarcts again noted within the bilateral cerebellar hemispheres. There is no acute intracranial hemorrhage. No demarcated cortical infarct. No extra-axial fluid collection. No evidence of an intracranial mass. No midline shift. Vascular: No hyperdense vessel.  Atherosclerotic calcifications. Skull: No calvarial fracture. 2.6 x 0.9 cm broad-based bony protrusion projecting outward from the left parietal calvarium, unchanged from the prior head CT of 07/05/2023 and likely reflecting an osteoma. Sinuses/Orbits: No mass or acute finding within the imaged orbits. No significant paranasal sinus disease. CT CERVICAL SPINE FINDINGS Alignment: No significant spondylolisthesis. Skull base and vertebrae: The basion-dental and atlanto-dental intervals are maintained.No evidence of acute fracture to the cervical spine. Schmorl node within the T4 superior endplate. Soft tissues and spinal canal: No prevertebral fluid or  swelling. No visible canal hematoma. Disc levels: Cervical spondylosis with multilevel disc space narrowing, disc bulges/central disc protrusions, posterior disc osteophyte complexes and uncovertebral hypertrophy. Disc space narrowing is greatest at C3-C4, C5-C6 and C7-T1 (advanced at these levels). Multilevel spinal canal stenosis. Most notably, at C3-C4 a posterior disc osteophyte complex contributes to at least moderate spinal canal  stenosis, and at C4-C5 a central disc protrusion contributes to apparent moderate spinal canal stenosis. Multilevel bony neural foraminal narrowing. Upper chest: No consolidation within the imaged lung apices. No visible pneumothorax. Biapical pulmonary scarring. IMPRESSION: CT head: 1. No evidence of an acute intracranial abnormality. 2. Parenchymal atrophy, chronic small vessel ischemic disease and chronic infarcts, as described. CT cervical spine: 1. No evidence of an acute cervical spine fracture. 2. Cervical spondylosis as described. Electronically Signed   By: Jackey Loge D.O.   On: 08/17/2023 13:01    Procedures Procedures    Medications Ordered in ED Medications  heparin injection 5,000 Units (has no administration in time range)  acetaminophen (TYLENOL) tablet 650 mg (has no administration in time range)    Or  acetaminophen (TYLENOL) suppository 650 mg (has no administration in time range)  senna-docusate (Senokot-S) tablet 1 tablet (has no administration in time range)  potassium chloride (KLOR-CON) packet 40 mEq (has no administration in time range)  atorvastatin (LIPITOR) tablet 40 mg (has no administration in time range)  levothyroxine (SYNTHROID) tablet 50 mcg (has no administration in time range)  lisinopril (ZESTRIL) tablet 10 mg (has no administration in time range)  verapamil (CALAN-SR) CR tablet 240 mg (has no administration in time range)  potassium chloride (KLOR-CON) packet 40 mEq (40 mEq Oral Given 08/17/23 1443)    ED Course/ Medical Decision Making/ A&P                          Medical Decision Making Amount and/or Complexity of Data Reviewed Labs: ordered. Decision-making details documented in ED Course. Radiology: ordered. Decision-making details documented in ED Course.  Risk Prescription drug management. Decision regarding hospitalization.    This patient presents to the ED for concern of confusion x few weeks, fall at home today; this involves an  extensive number of treatment options, and is a complaint that carries with it a high risk of complications and morbidity.  I considered the following differential and admission for this acute, potentially life threatening condition.   MDM:    Ddx of acute altered mental status or encephalopathy considered but not limited to: -Intracranial abnormalities such as ICH, hydrocephalus, head trauma -Infection such as UTI, PNA - no reported infectious sxs and no hypoxia but is mildly tachypneic, will eval w/ resp panel and CXR -Toxic ingestion such as EtOH, opiate, polypharmacy -Electrolyte abnormalities or hyper/hypoglycemia -Hepatic encephalopathy or uremia -ACS or arrhythmia - EKG w/ no signs of arrhythmia or ischemia -Endocrine abnormality - patient does take meds for hypothyroidism, wille val w/ thyroid studies -Vertebral injury - no reported neck pain but given altered mental status/confusion patient is given c-collar and will get CT -Fractures -reports right knee pain and will get pelvis femur and knee x-rays although patient does not have any deformities on exam or any obvious injuries except mild ecchymosis on bilateral lower extremities   Clinical Course as of 08/17/23 1524  Fri Aug 17, 2023  1306 Potassium(!): 2.8 +Hypokalemia, will replete. Does take verapamil, which could contribute. Consider possibly poor PO Intake as well if confused and living alone. [HN]  1306 CT Head Wo Contrast CT head: 1. No evidence of an acute intracranial abnormality. 2. Parenchymal atrophy, chronic small vessel ischemic disease and chronic infarcts, as described.  CT cervical spine: 1. No evidence of an acute cervical spine fracture. 2. Cervical spondylosis as described.   [HN]    Clinical Course User Index [HN] Loetta Rough, MD    Labs: I Ordered, and personally interpreted labs.  The pertinent results include:  those listed above  Imaging Studies ordered: I ordered imaging studies  including CTH, CT C-spine, CXR, RLE XRs I independently visualized and interpreted imaging. I agree with the radiologist interpretation  Additional history obtained from chart review, family, EMS.    Cardiac Monitoring: The patient was maintained on a cardiac monitor.  I personally viewed and interpreted the cardiac monitored which showed an underlying rhythm of: NSR  Reevaluation: After the interventions noted above, I reevaluated the patient and found that they have :stayed the same  Social Determinants of Health: Lives alone  Disposition: Patient's traumatic workup is very reassuring. Hypokalemic to 2.8. Her urine is still pending.  She has been confused for a few weeks and is not at her baseline. Admitted to medicine for confusion, fall at home, hypokalemia.  Co morbidities that complicate the patient evaluation  Past Medical History:  Diagnosis Date   Diabetes mellitus without complication (HCC)    Hypertension    Thyroid disease      Medicines Meds ordered this encounter  Medications   DISCONTD: potassium chloride SA (KLOR-CON M) CR tablet 40 mEq   potassium chloride (KLOR-CON) packet 40 mEq   heparin injection 5,000 Units   OR Linked Order Group    acetaminophen (TYLENOL) tablet 650 mg    acetaminophen (TYLENOL) suppository 650 mg   senna-docusate (Senokot-S) tablet 1 tablet   DISCONTD: potassium chloride 10 mEq in 100 mL IVPB   potassium chloride (KLOR-CON) packet 40 mEq   atorvastatin (LIPITOR) tablet 40 mg   levothyroxine (SYNTHROID) tablet 50 mcg   lisinopril (ZESTRIL) tablet 10 mg   verapamil (CALAN-SR) CR tablet 240 mg    I have reviewed the patients home medicines and have made adjustments as needed  Problem List / ED Course: Problem List Items Addressed This Visit       Other   Hypokalemia   Other Visit Diagnoses     Fall in home, initial encounter    -  Primary   Confusion                       This note was created using  dictation software, which may contain spelling or grammatical errors.    Loetta Rough, MD 08/17/23 [phone]

## 2023-08-17 NOTE — Progress Notes (Signed)
EEG complete - results pending 

## 2023-08-17 NOTE — Evaluation (Signed)
Clinical/Bedside Swallow Evaluation Patient Details  Name: Jennifer Zhang MRN: [id number] Date of Birth: 26-Apr-1926  Today's Date: 08/17/2023 Time: SLP Start Time (ACUTE ONLY): 1709 SLP Stop Time (ACUTE ONLY): 1720 SLP Time Calculation (min) (ACUTE ONLY): 11 min  Past Medical History:  Past Medical History:  Diagnosis Date   Diabetes mellitus without complication (HCC)    Hypertension    Thyroid disease    Past Surgical History:  Past Surgical History:  Procedure Laterality Date   ABDOMINAL HYSTERECTOMY     FOOT SURGERY Right 1960   HPI:  Jennifer Zhang is a 89 yo female presenting to ED 11/15 with AMS and R knee pain after a fall. CTH negative. MRI Brain pending. PMH includes HTN, DM, thyroid disease    Assessment / Plan / Recommendation  Clinical Impression  Pt reports that she typically eats finely chopped solids and has no trouble swallowing. Session was limited this date by pt's bathroom needs. Oral motor exam WFL, although note edentulism. Consecutive sips of thin liquids resulted in a delayed cough, although this did not recur when cued to take controlled, single sips. Trials of purees and small pieces of soft solids were overall WFL, with pt able to mash them effecitvely to achieve complete oral clearance. Recommend initiating diet of Dys 2 textures with thin liquids and full supervision to cue pt to take small, single sips. Meds should be given whole with puree. SLP will f/u. SLP Visit Diagnosis: Dysphagia, unspecified (R13.10)    Aspiration Risk  Mild aspiration risk    Diet Recommendation Dysphagia 2 (Fine chop);Thin liquid    Liquid Administration via: Cup;Spoon Medication Administration: Whole meds with puree Supervision: Staff to assist with self feeding;Full supervision/cueing for compensatory strategies Compensations: Minimize environmental distractions;Slow rate;Small sips/bites Postural Changes: Seated upright at 90 degrees    Other  Recommendations Oral  Care Recommendations: Oral care BID    Recommendations for follow up therapy are one component of a multi-disciplinary discharge planning process, led by the attending physician.  Recommendations may be updated based on patient status, additional functional criteria and insurance authorization.  Follow up Recommendations No SLP follow up      Assistance Recommended at Discharge    Functional Status Assessment Patient has had a recent decline in their functional status and demonstrates the ability to make significant improvements in function in a reasonable and predictable amount of time.  Frequency and Duration min 2x/week  1 week       Prognosis Prognosis for improved oropharyngeal function: Good Barriers to Reach Goals: Time post onset      Swallow Study   General HPI: Jennifer Zhang is a 89 yo female presenting to ED 11/15 with AMS and R knee pain after a fall. CTH negative. MRI Brain pending. PMH includes HTN, DM, thyroid disease Type of Study: Bedside Swallow Evaluation Previous Swallow Assessment: none in chart Diet Prior to this Study: NPO Temperature Spikes Noted: No Respiratory Status: Room air History of Recent Intubation: No Behavior/Cognition: Alert;Cooperative;Pleasant mood;Requires cueing Oral Cavity Assessment: Within Functional Limits Oral Care Completed by SLP: No Oral Cavity - Dentition: Edentulous Vision: Functional for self-feeding Self-Feeding Abilities: Needs assist Patient Positioning: Upright in bed Baseline Vocal Quality: Normal Volitional Cough: Strong Volitional Swallow: Able to elicit    Oral/Motor/Sensory Function Overall Oral Motor/Sensory Function: Within functional limits   Ice Chips Ice chips: Not tested   Thin Liquid Thin Liquid: Impaired Presentation: Straw Pharyngeal  Phase Impairments: Cough - Delayed  Nectar Thick Nectar Thick Liquid: Not tested   Honey Thick Honey Thick Liquid: Not tested   Puree Puree: Within functional  limits Presentation: Spoon   Solid     Solid: Within functional limits Presentation: Jethro Bastos, M.A., CF-SLP Speech Language Pathology, Acute Rehabilitation Services  Secure Chat preferred [phone]  08/17/2023,5:29 PM

## 2023-08-17 NOTE — Hospital Course (Addendum)
[;  easeomg a;ltered AMS lady Found down.  Thinks she fell yesterday but can't give more informaiton Not sure what is going on.  No other labs abnormlities but Hypo K  Right knee pain   Got K  Patient is stating that she is doing well. Patient is able to state her name and where she was out. Patient daughter was stating that she goes to check on her mom everyday. She came today and found her on the ground. Patient had a fall about 1 month ago and had to come to hospital for this. Patient seemed off and so she brought her in. At baseline patient is usually in the chair and says "I'm here" but today there were papers all over the floor and patient was on the floor. Patient said that the sky is really red and has been saying things that do not make sense. She has been saying things that her not normal over the past month. Patient had no weakness per daughter and was able to tell and was able to tell who her daughter is. Patient reports that she stayed up all last night. About 1 month ago patient had slurred speech and changes to how patient is thinking. Patient had an episode like this on Sunday and daughter stayed with her that night. Patient recently did have some fluid on her and she recently was taken off hydrochlorothiazide and decreased her metformin. Patient is able to manage her own medications. Daughter reports that patient did not get her medications this morning. Normally feeds herself and likes to eat blackberries and pears. No fevers or chill. No coughing. No urinary symptoms that she told her daughter. She denies any difficulty voiding but does report some urinary frequncy. 2 weeks ago patient has been seeing things that no one else can see.   Meds: Torsemide 10 mg daily- Started taking it Monday Verapamil 240 mg daily  Atorvastatin 40 mg daily  Lisinopril 10 mg daily  Isosorbide mononitrate 10 mg BID Levothyroxine 50 mcg daily  Metformin 500 mg daily   Medical History: HTN, HLD, type  2 diabetes mellitus, hypothyroid   Family:  Mom: cancer   Surgery:  Social: Lives with: Alone in [place]  Support: Good support in her daughter  Occupation: Substances: No substance use  Level of function: Mostly dependent in all ADLs and iADLs. Uses a walker  PCP: Alcide Clever, NP   Full code   Bethann Berkshire: [phone]   -------------------------- Alert and orinted to hospital, Unable to orint to month, year,  Reports nausea? Able to eat, no SOB or CP, abdominal pain, good BM s. No dysuria.

## 2023-08-17 NOTE — Procedures (Signed)
Patient Name: Jennifer Zhang  MRN: [id number]  Epilepsy Attending: Charlsie Quest  Referring Physician/Provider: Modena Slater, DO  Date: 08/17/2023 Duration: 30.45 mins  Patient history: 89 yo F with ams getting eeg to evaluate for seizure  Level of alertness: Awake, asleep  AEDs during EEG study: None  Technical aspects: This EEG study was done with scalp electrodes positioned according to the 10-20 International system of electrode placement. Electrical activity was reviewed with band pass filter of 1-70Hz , sensitivity of 7 uV/mm, display speed of 8mm/sec with a 60Hz  notched filter applied as appropriate. EEG data were recorded continuously and digitally stored.  Video monitoring was available and reviewed as appropriate.  Description: The posterior dominant rhythm consists of 8-9  Hz activity of moderate voltage (25-35 uV) seen predominantly in posterior head regions, symmetric and reactive to eye opening and eye closing. Sleep was characterized by vertex waves, sleep spindles (12 to 14 Hz), maximal frontocentral region. Hyperventilation and photic stimulation were not performed.     IMPRESSION: This study is within normal limits. No seizures or epileptiform discharges were seen throughout the recording.  A normal interictal EEG does not exclude the diagnosis of epilepsy.  Terisa Belardo Annabelle Harman

## 2023-08-17 NOTE — Progress Notes (Signed)
SLP Cancellation Note  Patient Details Name: Jennifer Zhang MRN: [id number] DOB: 09-16-1926   Cancelled treatment:       Reason Eval/Treat Not Completed: Patient at procedure or test/unavailable. SLP will f/u.   Gwynneth Aliment, M.A., CF-SLP Speech Language Pathology, Acute Rehabilitation Services  Secure Chat preferred [phone]  08/17/2023, 4:29 PM

## 2023-08-17 NOTE — ED Notes (Signed)
ED TO INPATIENT HANDOFF REPORT  ED Nurse Name and Phone #: [phone]  S Name/Age/Gender Jennifer Zhang 89 y.o. female Room/Bed: 011C/011C  Code Status   Code Status: Prior  Home/SNF/Other Unsure lives alone  Patient oriented to: self and situation Is this baseline? No   Triage Complete: Triage complete  Chief Complaint Fall, AMS  Triage Note Pt BIB [facility] with reports of mechanical fall and c/o right knee pain. Unknown what time pt fell.Family reports pt is "altered today."    Allergies Allergies  Allergen Reactions   Tape Other (See Comments)    SKIN WILL TEAR EASILY!!   Aspirin Other (See Comments) and Palpitations    Heart flutters  Heart flutter    Level of Care/Admitting Diagnosis ED Disposition     ED Disposition  Admit   Condition  --   Comment  The patient appears reasonably stabilized for admission considering the current resources, flow, and capabilities available in the ED at this time, and I doubt any other [facility] requiring further screening and/or treatment in the ED prior to admission is  present.          B Medical/Surgery History Past Medical History:  Diagnosis Date   Diabetes mellitus without complication (HCC)    Hypertension    Thyroid disease    Past Surgical History:  Procedure Laterality Date   ABDOMINAL HYSTERECTOMY     FOOT SURGERY Right 1960     A IV Location/Drains/Wounds Patient Lines/Drains/Airways Status     Active Line/Drains/Airways     Name Placement date Placement time Site Days   Peripheral IV 07/05/23 20 G Left Antecubital 07/05/23  --  Antecubital  43            Intake/Output Last 24 hours No intake or output data in the 24 hours ending 08/17/23 1321  Labs/Imaging Results for orders placed or performed during the hospital encounter of 08/17/23 (from the past 48 hour(s))  SARS Coronavirus 2 by RT PCR (hospital order, performed in [facility] hospital lab) *cepheid single result test* Anterior Nasal Swab      Status: None   Collection Time: 08/17/23 11:29 AM   Specimen: Anterior Nasal Swab  Result Value Ref Range   SARS Coronavirus 2 by RT PCR NEGATIVE NEGATIVE    Comment: Performed at [facility], [address], [place], [place] [address]  CBC with Differential     Status: Abnormal   Collection Time: 08/17/23 11:44 AM  Result Value Ref Range   WBC 9.6 4.0 - 10.5 K/uL   RBC 3.57 (L) 3.87 - 5.11 MIL/uL   Hemoglobin 10.6 (L) 12.0 - 15.0 g/dL   HCT 13.2 (L) 44.0 - 10.2 %   MCV 90.8 80.0 - 100.0 fL   MCH 29.7 26.0 - 34.0 pg   MCHC 32.7 30.0 - 36.0 g/dL   RDW 72.5 (H) 36.6 - 44.0 %   Platelets 232 150 - 400 K/uL   nRBC 0.0 0.0 - 0.2 %   Neutrophils Relative % 74 %   Neutro Abs 7.0 1.7 - 7.7 K/uL   Lymphocytes Relative 18 %   Lymphs Abs 1.7 0.7 - 4.0 K/uL   Monocytes Relative 8 %   Monocytes Absolute 0.8 0.1 - 1.0 K/uL   Eosinophils Relative 0 %   Eosinophils Absolute 0.0 0.0 - 0.5 K/uL   Basophils Relative 0 %   Basophils Absolute 0.0 0.0 - 0.1 K/uL   Immature Granulocytes 0 %   Abs Immature  Granulocytes 0.03 0.00 - 0.07 K/uL    Comment: Performed at [facility], [address] [place], [place] [address]  Comprehensive metabolic panel     Status: Abnormal   Collection Time: 08/17/23 11:44 AM  Result Value Ref Range   Sodium 142 135 - 145 mmol/L   Potassium 2.8 (L) 3.5 - 5.1 mmol/L   Chloride 100 98 - 111 mmol/L   CO2 29 22 - 32 mmol/L   Glucose, Bld 113 (H) 70 - 99 mg/dL    Comment: Glucose reference range applies only to samples taken after fasting for at least 8 hours.   BUN 13 8 - 23 mg/dL   Creatinine, Ser 6.04 0.44 - 1.00 mg/dL   Calcium 8.7 (L) 8.9 - 10.3 mg/dL   Total Protein 6.6 6.5 - 8.1 g/dL   Albumin 3.9 3.5 - 5.0 g/dL   AST 22 15 - 41 U/L   ALT 18 0 - 44 U/L   Alkaline Phosphatase 40 38 - 126 U/L   Total Bilirubin 0.9 <1.2 mg/dL   GFR, Estimated 53 (L) >60 mL/min    Comment: (NOTE) Calculated using the CKD-EPI Creatinine Equation (2021)     Anion gap 13 5 - 15    Comment: Performed at [facility], [address] [place], [place] [address]  Troponin I (High Sensitivity)     Status: Abnormal   Collection Time: 08/17/23 11:44 AM  Result Value Ref Range   Troponin I (High Sensitivity) 18 (H) <18 ng/L    Comment: (NOTE) Elevated high sensitivity troponin I (hsTnI) values and significant  changes across serial measurements may suggest ACS but many other  chronic and acute conditions are known to elevate hsTnI results.  Refer to the "Links" section for chest pain algorithms and additional  guidance. Performed at [facility], [address], [place], [place] [address]   CK     Status: Abnormal   Collection Time: 08/17/23 11:44 AM  Result Value Ref Range   Total CK 441 (H) 38 - 234 U/L    Comment: Performed at [facility], [address] [place], [place] [address]  Ethanol     Status: None   Collection Time: 08/17/23 11:44 AM  Result Value Ref Range   Alcohol, Ethyl (B) <10 <10 mg/dL    Comment: (NOTE) Lowest detectable limit for serum alcohol is 10 mg/dL.  For medical purposes only. Performed at [facility], [address], [place], [place] [address]   Ammonia     Status: None   Collection Time: 08/17/23 11:44 AM  Result Value Ref Range   Ammonia <10 9 - 35 umol/L    Comment: Performed at [facility], [address], [place], [place] [address]  TSH     Status: None   Collection Time: 08/17/23 11:44 AM  Result Value Ref Range   TSH 1.742 0.350 - 4.500 uIU/mL    Comment: Performed by a 3rd Generation assay with a functional sensitivity of <=0.01 uIU/mL. Performed at [facility], [address], [place], [place] [address]   T4, free     Status: Abnormal   Collection Time: 08/17/23 11:44 AM  Result Value Ref Range   Free T4 1.24 (H) 0.61 - 1.12 ng/dL    Comment: (NOTE) Biotin ingestion may interfere with free T4 tests. If the results are inconsistent with the TSH level,  previous test results, or the clinical presentation, then  consider biotin interference. If needed, order repeat testing after stopping biotin. Performed at [facility], [address] [place], [place] [address]    CT Head Wo Contrast  Result Date: 08/17/2023 CLINICAL DATA:  Provided history: Head trauma, minor. Mental status change, unknown cause. Neck trauma. Additional history: Mechanical fall. EXAM: CT HEAD WITHOUT CONTRAST CT CERVICAL SPINE WITHOUT CONTRAST TECHNIQUE: Multidetector CT imaging of the head and cervical spine was performed following the standard protocol without intravenous contrast. Multiplanar CT image reconstructions of the cervical spine were also generated. RADIATION DOSE REDUCTION: This exam was performed according to the departmental dose-optimization program which includes automated exposure control, adjustment of the mA and/or kV according to patient size and/or use of iterative reconstruction technique. COMPARISON:  Head CT 07/05/2023.  Cervical spine CT 07/05/2023. FINDINGS: CT HEAD FINDINGS Brain: Generalized cerebral atrophy. Mild cerebellar volume loss. Prominence of the ventricles and sulci appears commensurate. Patchy and confluent hypoattenuation within the cerebral white matter, nonspecific but compatible chronic small vessel ischemic disease. Chronic small vessel ischemic changes also present within/about the bilateral deep gray nuclei. Multiple chronic infarcts again noted within the bilateral cerebellar hemispheres. There is no acute intracranial hemorrhage. No demarcated cortical infarct. No extra-axial fluid collection. No evidence of an intracranial mass. No midline shift. Vascular: No hyperdense vessel.  Atherosclerotic calcifications. Skull: No calvarial fracture. 2.6 x 0.9 cm broad-based bony protrusion projecting outward from the left parietal calvarium, unchanged from the prior head CT of 07/05/2023 and likely reflecting an osteoma. Sinuses/Orbits: No  mass or acute finding within the imaged orbits. No significant paranasal sinus disease. CT CERVICAL SPINE FINDINGS Alignment: No significant spondylolisthesis. Skull base and vertebrae: The basion-dental and atlanto-dental intervals are maintained.No evidence of acute fracture to the cervical spine. Schmorl node within the T4 superior endplate. Soft tissues and spinal canal: No prevertebral fluid or swelling. No visible canal hematoma. Disc levels: Cervical spondylosis with multilevel disc space narrowing, disc bulges/central disc protrusions, posterior disc osteophyte complexes and uncovertebral hypertrophy. Disc space narrowing is greatest at C3-C4, C5-C6 and C7-T1 (advanced at these levels). Multilevel spinal canal stenosis. Most notably, at C3-C4 a posterior disc osteophyte complex contributes to at least moderate spinal canal stenosis, and at C4-C5 a central disc protrusion contributes to apparent moderate spinal canal stenosis. Multilevel bony neural foraminal narrowing. Upper chest: No consolidation within the imaged lung apices. No visible pneumothorax. Biapical pulmonary scarring. IMPRESSION: CT head: 1. No evidence of an acute intracranial abnormality. 2. Parenchymal atrophy, chronic small vessel ischemic disease and chronic infarcts, as described. CT cervical spine: 1. No evidence of an acute cervical spine fracture. 2. Cervical spondylosis as described. Electronically Signed   By: Jackey Loge D.O.   On: 08/17/2023 13:01   CT Cervical Spine Wo Contrast  Result Date: 08/17/2023 CLINICAL DATA:  Provided history: Head trauma, minor. Mental status change, unknown cause. Neck trauma. Additional history: Mechanical fall. EXAM: CT HEAD WITHOUT CONTRAST CT CERVICAL SPINE WITHOUT CONTRAST TECHNIQUE: Multidetector CT imaging of the head and cervical spine was performed following the standard protocol without intravenous contrast. Multiplanar CT image reconstructions of the cervical spine were also generated.  RADIATION DOSE REDUCTION: This exam was performed according to the departmental dose-optimization program which includes automated exposure control, adjustment of the mA and/or kV according to patient size and/or use of iterative reconstruction technique. COMPARISON:  Head CT 07/05/2023.  Cervical spine CT 07/05/2023. FINDINGS: CT HEAD FINDINGS Brain: Generalized cerebral atrophy. Mild cerebellar volume loss. Prominence of the ventricles and sulci  appears commensurate. Patchy and confluent hypoattenuation within the cerebral white matter, nonspecific but compatible chronic small vessel ischemic disease. Chronic small vessel ischemic changes also present within/about the bilateral deep gray nuclei. Multiple chronic infarcts again noted within the bilateral cerebellar hemispheres. There is no acute intracranial hemorrhage. No demarcated cortical infarct. No extra-axial fluid collection. No evidence of an intracranial mass. No midline shift. Vascular: No hyperdense vessel.  Atherosclerotic calcifications. Skull: No calvarial fracture. 2.6 x 0.9 cm broad-based bony protrusion projecting outward from the left parietal calvarium, unchanged from the prior head CT of 07/05/2023 and likely reflecting an osteoma. Sinuses/Orbits: No mass or acute finding within the imaged orbits. No significant paranasal sinus disease. CT CERVICAL SPINE FINDINGS Alignment: No significant spondylolisthesis. Skull base and vertebrae: The basion-dental and atlanto-dental intervals are maintained.No evidence of acute fracture to the cervical spine. Schmorl node within the T4 superior endplate. Soft tissues and spinal canal: No prevertebral fluid or swelling. No visible canal hematoma. Disc levels: Cervical spondylosis with multilevel disc space narrowing, disc bulges/central disc protrusions, posterior disc osteophyte complexes and uncovertebral hypertrophy. Disc space narrowing is greatest at C3-C4, C5-C6 and C7-T1 (advanced at these levels).  Multilevel spinal canal stenosis. Most notably, at C3-C4 a posterior disc osteophyte complex contributes to at least moderate spinal canal stenosis, and at C4-C5 a central disc protrusion contributes to apparent moderate spinal canal stenosis. Multilevel bony neural foraminal narrowing. Upper chest: No consolidation within the imaged lung apices. No visible pneumothorax. Biapical pulmonary scarring. IMPRESSION: CT head: 1. No evidence of an acute intracranial abnormality. 2. Parenchymal atrophy, chronic small vessel ischemic disease and chronic infarcts, as described. CT cervical spine: 1. No evidence of an acute cervical spine fracture. 2. Cervical spondylosis as described. Electronically Signed   By: Jackey Loge D.O.   On: 08/17/2023 13:01    Pending Labs Unresulted Labs (From admission, onward)     Start     Ordered   08/17/23 1129  Urinalysis, Routine w reflex microscopic -Urine, Clean Catch  Once,   URGENT       Question:  Specimen Source  Answer:  Urine, Clean Catch   08/17/23 1128   08/17/23 1129  Rapid urine drug screen (hospital performed)  ONCE - STAT,   STAT        08/17/23 1128            Vitals/Pain Today's Vitals   08/17/23 1123 08/17/23 1128 08/17/23 1130 08/17/23 1152  BP:   (!) 166/80   Pulse:   78   Resp:   20   Temp: 98.2 F (36.8 C)  98.3 F (36.8 C)   TempSrc: Oral  Oral   SpO2:   100%   Weight:    150 lb (68 kg)  Height:    5' (1.524 m)  PainSc:  0-No pain      Isolation Precautions No active isolations  Medications Medications  potassium chloride SA (KLOR-CON M) CR tablet 40 mEq (has no administration in time range)    Mobility walks with person assist     Focused Assessments Neuro Assessment Handoff:  Swallow screen pass?  N/a Cardiac Rhythm: Normal sinus rhythm       Neuro Assessment: Exceptions to WDL Neuro Checks:      Has TPA been given? No If patient is a Neuro Trauma and patient is going to OR before floor call report to 4N  Charge nurse: [phone] or [phone]   R Recommendations: See Admitting Provider Note  Report given  to:   Additional Notes: 5350

## 2023-08-17 NOTE — H&P (Signed)
Date: 08/17/2023               Patient Name:  Jennifer Zhang MRN: [id number]  DOB: Nov 11, 1925 Age / Sex: 89 y.o., female   PCP: Nona Dell, NP         Medical Service: Internal Medicine Teaching Service         Attending Physician: Dr. Charissa Bash    First Contact: Dr. Christie Nottingham Pager: [phone]  Second Contact: Dr. Modena Slater Pager: [phone]       After Hours (After 5p/  First Contact Pager: [phone]  weekends / holidays): Second Contact Pager: [phone]   Chief Complaint: Altered mental status, fall  History of Present Illness: Jennifer Zhang is a 89 y.o. female with PMH of hypertension, diabetes mellitus, thyroid disease, AMS. Patient lives alone. Reportedly, family found patient on the floor today and reports that she has been more confused for the last few weeks. She states it is 39. She endorses pain in her right knee after a fall yesterday. She states she doesn't remember why she fell. She denies any other symptoms and is otherwise cooperative and pleasant. Denies headache, neck/back pain, CP, abd pain, urinary sxs. Noted to have scattered ecchymoses on BL shins. HDS though mildly hypertensive on arrival.  Pt's daughter is present and assisting with history.   Patient is stating that she is doing well. Patient is able to state her name and where she was out. Patient daughter was stating that she goes to check on her mom everyday. She came today and found her on the ground. Patient had a fall about 1 month ago and had to come to hospital for this. Patient seemed off and so she brought her in.   At baseline patient is A&Ox4, and greets her daughter daily from the chair in her living room "I'm here" but today there were papers all over the floor and patient was on the floor of the kitchen. Patient said that the sky is really red and has been saying things that do not make sense. Pt has been saying things that her not normal over the past month. No weakness.  Patient reports that she stayed up all last night. About 1 month ago patient had slurred speech and changes to how patient is thinking. Patient had an episode like this on Sunday and daughter stayed with her that night. Patient recently did have some fluid on her and she recently was taken off hydrochlorothiazide and decreased her metformin. Patient is able to manage her own medications with daily assistance from daughter. Daughter reports that patient did not get her medications this morning. Normally feeds herself and likes to eat blackberries and pears.   No fevers or chills. No coughing. No urinary symptoms that she told her daughter. She denies any difficulty voiding but does report some urinary frequncy. 2 weeks ago patient has been seeing things that no one else can see (e.g. red sky, bug flying around room).   Meds:  Atorvastatin 40 at bedtime  Isosorbide mononitrate 10 mg Levothyroxine 50 mcg Lisinopril 10 mg Nitroglycerine 0.4 mg SL Olopatadine eye solution Verapamil 240 mg CR  No outpatient medications have been marked as taking for the 08/17/23 encounter [facility] Encounter).     Allergies: Allergies as of 08/17/2023 - Review Complete 08/17/2023  Allergen Reaction Noted   Tape Other (See Comments) 11/10/2019   Aspirin Other (See Comments) and Palpitations 09/10/2014   Past Medical History:  Diagnosis Date   Diabetes mellitus without complication (HCC)    Hypertension    Thyroid disease     Latest Reference Range & Units 08/17/23 11:44  CK Total 38 - 234 U/L 441 (H)  Troponin I (High Sensitivity) <18 ng/L 18 (H)    Latest Reference Range & Units 08/17/23 11:44  TSH 0.350 - 4.500 uIU/mL 1.742  T4,Free(Direct) 0.61 - 1.12 ng/dL 0.45 (H)  (H): Data is abnormally high Family History: non contributory  Social History:  Lives with: Alone in [place]  Support: Good support in her daughter  Occupation: retired Substances: No substance use  Level of function: Mostly  dependent in all ADLs and iADLs. Uses a rolling walker  PCP: Alcide Clever, NP   Review of Systems: A complete ROS was negative except as per HPI.   Physical Exam: Blood pressure (!) 166/80, pulse 78, temperature 98.3 F (36.8 C), temperature source Oral, resp. rate 20, height 5' (1.524 m), weight 68 kg, SpO2 100%.  Physical Exam Vitals and nursing note reviewed.  HENT:     Head: Normocephalic and atraumatic.     Mouth/Throat:     Mouth: Mucous membranes are moist.  Eyes:     Extraocular Movements:     Right eye: Abnormal extraocular motion present.     Left eye: Abnormal extraocular motion present.     Comments: Eyes have vertical saccades.   Cardiovascular:     Rate and Rhythm: Rhythm irregular.     Pulses: Normal pulses.     Heart sounds: Normal heart sounds.  Pulmonary:     Effort: Pulmonary effort is normal. No respiratory distress.     Breath sounds: Normal breath sounds. No stridor. No wheezing, rhonchi or rales.  Chest:     Chest wall: No tenderness.  Abdominal:     General: Abdomen is flat. Bowel sounds are normal.     Palpations: Abdomen is soft.     Tenderness: There is abdominal tenderness in the right lower quadrant. There is no guarding or rebound.  Musculoskeletal:        General: Swelling and signs of injury present.     Comments: Swelling in bilateral knees where it appears she fell. Bruises on front of shins, posterior forearms.   Skin:    General: Skin is warm and dry.     Findings: Bruising present.     Comments: Bruising on forearms, shins. Does not appear defensive in nature.  Neurological:     Mental Status: She is disoriented.     Comments: Pt able to track motion across midline, but loses vertical tracking. FTN grossly impaired bilaterally along with other coordination abnormalities.   Psychiatric:        Attention and Perception: She is inattentive. She perceives visual hallucinations.        Speech: Speech is slurred.        Behavior:  Behavior is cooperative.        Cognition and Memory: Cognition is impaired. Memory is impaired.     Comments: Pt is hallucinating that there is a bug in the room. Pt is pleasantly altered.       EKG: personally reviewed my interpretation is LVH with secondary repolarization abnormality. Borderline prolonged QT interval   CXR: personally reviewed my interpretation is possible osteopenia, gas in bowels, no acute chest abnormality.   CT head:  1. No evidence of an acute intracranial abnormality. 2. Parenchymal atrophy, chronic small vessel ischemic disease and chronic infarcts, as described.  CT cervical spine: 1. No evidence of an acute cervical spine fracture. 2. Cervical spondylosis as described.    Assessment & Plan by Problem:  Altered Mental Status: Pt is being admitted for mental status changes. These changes seem to have both acute and chronic components. She has had a gradual decline in function since October, the patient was disoriented and truly altered when found by her daughter on 11/15. MRI from 2021 showed some intracranial chronic small vessel ischemia. Pt's slurred speech has been present for approximately one month along with vision and coordination changes. Working this up with a wide differential to look at vascular, metabolic, or toxic etiologies.  - Admit to IMTS, attending Dr Charissa Bash - Regular diet - CBGs per floor protocol - MRI Head to look for acute/subacute stroke process - Potassium repleted orally, will add potassium IV replenishment while inpatient   FEN: Normal VTE: Heparin subcutaneous  Code status: Full code, MOST document posted. Dispo: SNF vs Home with Home health.  Dispo: Admit patient to Inpatient with expected length of stay greater than 2 midnights.  Signed: Margaretmary Dys, MD 08/17/2023, 2:32 PM  Pager: @MYPAGER @ After 5pm on weekdays and 1pm on weekends: On Call pager: [phone]

## 2023-08-17 NOTE — ED Notes (Signed)
Lab added new orders to blood in lab

## 2023-08-17 NOTE — Progress Notes (Signed)
PT Cancellation Note  Patient Details Name: Jennifer Zhang MRN: [id number] DOB: [birth year]   Cancelled Treatment:    Reason Eval/Treat Not Completed: Patient at procedure or test/unavailable  EEG tech finishing up in room first attempt. SLP arrived afterwards. Will attempt PT evaluation tomorrow.  Kathlyn Sacramento, PT, DPT [facility]  Rehabilitation Services Physical Therapist Office: [phone] Website: [email or web address]   Berton Mount 08/17/2023, 5:22 PM

## 2023-08-18 ENCOUNTER — Other Ambulatory Visit (HOSPITAL_COMMUNITY): Payer: 59

## 2023-08-18 DIAGNOSIS — F03918 Unspecified dementia, unspecified severity, with other behavioral disturbance: Secondary | ICD-10-CM | POA: Diagnosis not present

## 2023-08-18 LAB — COMPREHENSIVE METABOLIC PANEL
ALT: 16 U/L (ref 0–44)
AST: 20 U/L (ref 15–41)
Albumin: 3.2 g/dL — ABNORMAL LOW (ref 3.5–5.0)
Alkaline Phosphatase: 33 U/L — ABNORMAL LOW (ref 38–126)
Anion gap: 11 (ref 5–15)
BUN: 11 mg/dL (ref 8–23)
CO2: 26 mmol/L (ref 22–32)
Calcium: 8.5 mg/dL — ABNORMAL LOW (ref 8.9–10.3)
Chloride: 104 mmol/L (ref 98–111)
Creatinine, Ser: 0.44 mg/dL (ref 0.44–1.00)
GFR, Estimated: >60 mL/min (ref >60)
Glucose, Bld: 72 mg/dL (ref 70–99)
Potassium: 2.9 mmol/L — ABNORMAL LOW (ref 3.5–5.1)
Sodium: 141 mmol/L (ref 135–145)
Total Bilirubin: 1.2 mg/dL — ABNORMAL HIGH (ref <1.2)
Total Protein: 5.6 g/dL — ABNORMAL LOW (ref 6.5–8.1)

## 2023-08-18 LAB — POTASSIUM: Potassium: 3.1 mmol/L — ABNORMAL LOW (ref 3.5–5.1)

## 2023-08-18 LAB — GLUCOSE, CAPILLARY
Glucose-Capillary: 71 mg/dL (ref 70–99)
Glucose-Capillary: 124 mg/dL — ABNORMAL HIGH (ref 70–99)
Glucose-Capillary: 141 mg/dL — ABNORMAL HIGH (ref 70–99)
Glucose-Capillary: 125 mg/dL — ABNORMAL HIGH (ref 70–99)

## 2023-08-18 LAB — CBC
HCT: 30.4 % — ABNORMAL LOW (ref 36.0–46.0)
Hemoglobin: 9.8 g/dL — ABNORMAL LOW (ref 12.0–15.0)
MCH: 28.9 pg (ref 26.0–34.0)
MCHC: 32.2 g/dL (ref 30.0–36.0)
MCV: 89.7 fL (ref 80.0–100.0)
Platelets: 205 10*3/uL (ref 150–400)
RBC: 3.39 MIL/uL — ABNORMAL LOW (ref 3.87–5.11)
RDW: 15.7 % — ABNORMAL HIGH (ref 11.5–15.5)
WBC: 7.8 10*3/uL (ref 4.0–10.5)
nRBC: 0 % (ref 0.0–0.2)

## 2023-08-18 LAB — URINE CULTURE: Culture: NO GROWTH

## 2023-08-18 LAB — IRON AND TIBC
Iron: 41 ug/dL (ref 28–170)
Saturation Ratios: 17 % (ref 10.4–31.8)
TIBC: 241 ug/dL — ABNORMAL LOW (ref 250–450)
UIBC: 200 ug/dL

## 2023-08-18 LAB — MAGNESIUM
Magnesium: 2.5 mg/dL — ABNORMAL HIGH (ref 1.7–2.4)
Magnesium: 2.1 mg/dL (ref 1.7–2.4)

## 2023-08-18 LAB — TROPONIN I (HIGH SENSITIVITY): Troponin I (High Sensitivity): 13 ng/L (ref <18)

## 2023-08-18 LAB — FERRITIN: Ferritin: 43 ng/mL (ref 11–307)

## 2023-08-18 MED ORDER — POTASSIUM CHLORIDE 20 MEQ PO PACK
40.0000 meq | PACK | Freq: Once | ORAL | Status: AC
  Administered 2023-08-18: 40 meq via ORAL
  Filled 2023-08-18: qty 2

## 2023-08-18 NOTE — Plan of Care (Signed)

## 2023-08-18 NOTE — Evaluation (Signed)
Occupational Therapy Evaluation Patient Details Name: Jennifer Zhang MRN: [id number] DOB: 1926/03/28 Today's Date: 08/18/2023   History of Present Illness 89 yo female presents to [facility] on 11/15 for mechanical fall and c/o right knee pain. Found to have Altered Mental Status.  PMH includes HTN, hypothyroidism, DMII.   Clinical Impression   Pt reports having assist at baseline with ADLs (from daughter) and uses rollator for mobility, lives alone but states daughter checks on her daily (used to have an aide but no longer has one). Pt currently needing set up - max A for ADLs, CGA for bed mobility and min A for transfers with RW. Pt needing cues to keep RW close and to keep hands on RW as pt reaching out for other means of external support throughout. Pt presenting with impairments listed below, will follow acutely. Patient will benefit from continued inpatient follow up therapy, <3 hours/day to maximize safety/ind with ADLs/functional mobility.        If plan is discharge home, recommend the following: A little help with walking and/or transfers;A lot of help with bathing/dressing/bathroom;Assistance with cooking/housework;Direct supervision/assist for medications management;Direct supervision/assist for financial management;Assist for transportation;Help with stairs or ramp for entrance    Functional Status Assessment  Patient has had a recent decline in their functional status and demonstrates the ability to make significant improvements in function in a reasonable and predictable amount of time.  Equipment Recommendations  Other (comment) (defer)    Recommendations for Other Services PT consult     Precautions / Restrictions Precautions Precautions: Fall Restrictions Weight Bearing Restrictions: No      Mobility Bed Mobility Overal bed mobility: Needs Assistance Bed Mobility: Supine to Sit     Supine to sit: Contact guard          Transfers Overall transfer level: Needs  assistance Equipment used: Rolling walker (2 wheels) Transfers: Sit to/from Stand Sit to Stand: Min assist           General transfer comment: cues to keep RW close to body, pt reaching out for other means of external support      Balance Overall balance assessment: Needs assistance Sitting-balance support: No upper extremity supported, Feet supported Sitting balance-Leahy Scale: Good     Standing balance support: Bilateral upper extremity supported Standing balance-Leahy Scale: Poor                             ADL either performed or assessed with clinical judgement   ADL Overall ADL's : Needs assistance/impaired Eating/Feeding: Set up;Sitting   Grooming: Set up;Sitting   Upper Body Bathing: Moderate assistance;Sitting   Lower Body Bathing: Maximal assistance;Sitting/lateral leans   Upper Body Dressing : Moderate assistance;Sitting   Lower Body Dressing: Maximal assistance;Sitting/lateral leans   Toilet Transfer: Minimal assistance;Ambulation;Rolling walker (2 wheels);Regular Toilet   Toileting- Clothing Manipulation and Hygiene: Supervision/safety       Functional mobility during ADLs: Minimal assistance;Rolling walker (2 wheels)       Vision   Vision Assessment?: No apparent visual deficits     Perception Perception: Not tested       Praxis Praxis: Not tested       Pertinent Vitals/Pain Pain Assessment Pain Assessment: Faces Pain Score: 2  Faces Pain Scale: Hurts a little bit Pain Location: Knees Pain Descriptors / Indicators: Sore Pain Intervention(s): Limited activity within patient's tolerance, Monitored during session     Extremity/Trunk Assessment Upper Extremity Assessment Upper Extremity Assessment:  Generalized weakness   Lower Extremity Assessment Lower Extremity Assessment: Defer to PT evaluation   Cervical / Trunk Assessment Cervical / Trunk Assessment: Normal   Communication Communication Communication: Hearing  impairment Cueing Techniques: Verbal cues;Gestural cues   Cognition Arousal: Alert Behavior During Therapy: [facility] for tasks assessed/performed Overall Cognitive Status: No family/caregiver present to determine baseline cognitive functioning                                 General Comments: states it is March 1925, then perseverative on it being [birth year](that is her bday)     General Comments  VSS    Exercises     Shoulder Instructions      Home Living Family/patient expects to be discharged to:: Private residence Living Arrangements: Alone Available Help at Discharge: Family Type of Home: Independent living facility Home Access: Elevator     Home Layout: One level     Bathroom Shower/Tub: Tub/shower unit         Home Equipment: Rollator (4 wheels);BSC/3in1;Tub bench;Rolling Walker (2 wheels)          Prior Functioning/Environment Prior Level of Function : Needs assist;History of Falls (last six months);Patient poor historian/Family not available             Mobility Comments: Rollator at all times per pt ADLs Comments: reports having daughter to assist        OT Problem List: Decreased strength;Decreased range of motion;Impaired balance (sitting and/or standing);Decreased activity tolerance;Decreased cognition;Decreased safety awareness;Decreased knowledge of use of DME or AE      OT Treatment/Interventions: Self-care/ADL training;Therapeutic exercise;Energy conservation;DME and/or AE instruction;Therapeutic activities;Balance training;Cognitive remediation/compensation;Patient/family education;Visual/perceptual remediation/compensation    OT Goals(Current goals can be found in the care plan section) Acute Rehab OT Goals Patient Stated Goal: none stated OT Goal Formulation: With patient Time For Goal Achievement: 09/01/23 Potential to Achieve Goals: Good ADL Goals Pt Will Perform Upper Body Dressing: with supervision;sitting Pt Will  Perform Lower Body Dressing: with supervision;sitting/lateral leans;sit to/from stand Pt Will Transfer to Toilet: with supervision;ambulating;regular height toilet Pt Will Perform Tub/Shower Transfer: Shower transfer;with supervision;ambulating  OT Frequency: Min 1X/week    Co-evaluation              AM-PAC OT "6 Clicks" Daily Activity     Outcome Measure Help from another person eating meals?: A Little Help from another person taking care of personal grooming?: A Little Help from another person toileting, which includes using toliet, bedpan, or urinal?: A Little Help from another person bathing (including washing, rinsing, drying)?: A Lot Help from another person to put on and taking off regular upper body clothing?: A Little Help from another person to put on and taking off regular lower body clothing?: A Lot 6 Click Score: 16   End of Session Equipment Utilized During Treatment: Gait belt;Rolling walker (2 wheels) Nurse Communication: Mobility status  Activity Tolerance: Patient tolerated treatment well Patient left: with call bell/phone within reach;in chair;with chair alarm set  OT Visit Diagnosis: Unsteadiness on feet (R26.81);Other abnormalities of gait and mobility (R26.89);Muscle weakness (generalized) (M62.81)                Time: 9563-8756 OT Time Calculation (min): 24 min Charges:  OT General Charges $OT Visit: 1 Visit OT Evaluation $OT Eval Moderate Complexity: 1 Mod OT Treatments $Self Care/Home Management : 8-22 mins  Carver Fila, OTD, OTR/L SecureChat Preferred Acute Rehab ([phone])  [phone]   Dalphine Handing 08/18/2023, 11:56 AM

## 2023-08-18 NOTE — Evaluation (Signed)
Physical Therapy Evaluation Patient Details Name: Jennifer Zhang MRN: [id number] DOB: [birth year] Today's Date: 08/18/2023  History of Present Illness  89 yo female presents to [facility] on 11/15 for mechanical fall and c/o right knee pain. Found to have Altered Mental Status.  PMH includes HTN, hypothyroidism, DMII.  Clinical Impression  Pt admitted with above diagnosis. Pt limited and questionable history provided. Oriented to self, location with cues, unsure of month/year, and some difficulty following simple motor tasks this morning. Requires min assist for transfer, posterior LOB. RW for support and intermittent min assist to navigate around obstacles, drifting towards Lt at times. Reduced safety awareness. Reports very limited support available. Favor SNF short term rehab before returning to ILF. Patient will benefit from continued inpatient follow up therapy, <3 hours/day.  Will follow and progress during admission, update recs as appropriate.Pt currently with functional limitations due to the deficits listed below (see PT Problem List). Pt will benefit from acute skilled PT to increase their independence and safety with mobility to allow discharge.           If plan is discharge home, recommend the following: A little help with walking and/or transfers;A little help with bathing/dressing/bathroom;Assistance with cooking/housework;Direct supervision/assist for medications management;Direct supervision/assist for financial management;Assist for transportation;Supervision due to cognitive status   Can travel by private vehicle   Yes    Equipment Recommendations None recommended by PT  Recommendations for Other Services       Functional Status Assessment Patient has had a recent decline in their functional status and demonstrates the ability to make significant improvements in function in a reasonable and predictable amount of time.     Precautions / Restrictions Precautions Precautions:  Fall Restrictions Weight Bearing Restrictions: No      Mobility  Bed Mobility               General bed mobility comments: in recliner    Transfers Overall transfer level: Needs assistance Equipment used: Rolling walker (2 wheels) Transfers: Sit to/from Stand Sit to Stand: Min assist           General transfer comment: Min assist for boost to stand and balance due to posterior lean. Max VC for hand placement. Performed x2 from recliner.    Ambulation/Gait Ambulation/Gait assistance: Min assist Gait Distance (Feet): 60 Feet Assistive device: Rolling walker (2 wheels) Gait Pattern/deviations: Step-through pattern, Drifts right/left, Trunk flexed Gait velocity: dec Gait velocity interpretation: <1.31 ft/sec, indicative of household ambulator   General Gait Details: VC for RW placement closer to proximity intermittently. Stepping outside of support of RW, drifting into obstacles unless corrected at times. No buckling, mild unsteadiness. Needed assist to recall where room was.  Stairs            Wheelchair Mobility     Tilt Bed    Modified Rankin (Stroke Patients Only)       Balance Overall balance assessment: Needs assistance Sitting-balance support: No upper extremity supported, Feet supported Sitting balance-Leahy Scale: Good     Standing balance support: Bilateral upper extremity supported Standing balance-Leahy Scale: Poor                               Pertinent Vitals/Pain Pain Assessment Pain Assessment: Faces Faces Pain Scale: Hurts a little bit Pain Location: Knees Pain Descriptors / Indicators: Sore Pain Intervention(s): Monitored during session, Repositioned    Home Living Family/patient expects to be discharged to:: Private  residence Living Arrangements: Alone Available Help at Discharge: Family (now limited) Type of Home: Independent living facility Home Access: Elevator       Home Layout: One level Home  Equipment: Rollator (4 wheels);BSC/3in1;Tub bench;Rolling Walker (2 wheels)      Prior Function Prior Level of Function : Needs assist;History of Falls (last six months);Patient poor historian/Family not available             Mobility Comments: Rollator at all times per pt       Extremity/Trunk Assessment   Upper Extremity Assessment Upper Extremity Assessment: Defer to OT evaluation    Lower Extremity Assessment Lower Extremity Assessment: Generalized weakness       Communication   Communication Communication: Hearing impairment Cueing Techniques: Verbal cues;Gestural cues;Tactile cues  Cognition Arousal: Alert Behavior During Therapy: WFL for tasks assessed/performed Overall Cognitive Status: No family/caregiver present to determine baseline cognitive functioning                                 General Comments: Oriented to self, required cues to make aware that she is in the hospital but unsure of name of hospital, unsure of month, unsure of year. Unable to state why she was admitted but during conversation recalls that she fell and slept on the floor.        General Comments General comments (skin integrity, edema, etc.): Mild dizziness upon standing. Pt reports this has been an ongoing problem. See orthostatic vitals for details.    Exercises     Assessment/Plan    PT Assessment Patient needs continued PT services  PT Problem List Decreased strength;Decreased activity tolerance;Decreased balance;Decreased range of motion;Decreased mobility;Decreased cognition;Decreased knowledge of use of DME;Decreased coordination;Decreased safety awareness;Decreased knowledge of precautions;Pain       PT Treatment Interventions DME instruction;Gait training;Functional mobility training;Therapeutic activities;Therapeutic exercise;Balance training;Neuromuscular re-education;Cognitive remediation;Patient/family education;Modalities    PT Goals (Current goals can  be found in the Care Plan section)  Acute Rehab PT Goals Patient Stated Goal: Get stronger PT Goal Formulation: With patient Time For Goal Achievement: 09/01/23 Potential to Achieve Goals: Good    Frequency Min 1X/week     Co-evaluation               AM-PAC PT "6 Clicks" Mobility  Outcome Measure Help needed turning from your back to your side while in a flat bed without using bedrails?: None Help needed moving from lying on your back to sitting on the side of a flat bed without using bedrails?: A Little Help needed moving to and from a bed to a chair (including a wheelchair)?: A Little Help needed standing up from a chair using your arms (e.g., wheelchair or bedside chair)?: A Little Help needed to walk in hospital room?: A Little Help needed climbing 3-5 steps with a railing? : A Lot 6 Click Score: 18    End of Session Equipment Utilized During Treatment: Gait belt Activity Tolerance: Patient tolerated treatment well Patient left: in chair;with call bell/phone within reach;with chair alarm set Nurse Communication: Mobility status PT Visit Diagnosis: Unsteadiness on feet (R26.81);Other abnormalities of gait and mobility (R26.89);Muscle weakness (generalized) (M62.81);History of falling (Z91.81);Other symptoms and signs involving the nervous system (R29.898)    Time: 4098-1191 PT Time Calculation (min) (ACUTE ONLY): 20 min   Charges:   PT Evaluation $PT Eval Low Complexity: 1 Low   PT General Charges $$ ACUTE PT VISIT: 1 Visit  Kathlyn Sacramento, PT, DPT [facility]  Rehabilitation Services Physical Therapist Office: [phone] Website: [email or web address]   Berton Mount 08/18/2023, 11:01 AM

## 2023-08-18 NOTE — Progress Notes (Signed)
Speech Language Pathology Treatment: Dysphagia  Patient Details Name: TATYM SATTERWHITE MRN: [id number] DOB: Jun 24, 1926 Today's Date: 08/18/2023 Time: 1660-6301 SLP Time Calculation (min) (ACUTE ONLY): 11 min  Assessment / Plan / Recommendation Clinical Impression  Pt seen for skilled SLP session to assess diet tolerance.  Today's assessment was limited as pt only wanted a couple of bites of diced pears and sips of water before reporting that she was full.  No PO intake documented in chart at this time. Pt remains afebrile and on room air. WBC is WNL.  CXR completed on 08/17/23 demonstrated "No acute findings. Streaky atelectasis or scarring at the lung bases.". Pt denied having breakfast yet and reported she did not have dinner last night.    Pt demonstrated prolonged mastication of diced pears with partial oral expectoration of unchewed pieces.  She reported that diced pears were "too hard" and that she consumes ground solids or some pureed foods at baseline, which is consistent with her current Dys2 diet. Observed immediate coughing x1 instance following swallow of diced pears.  Pt recovered quickly and reported that "sometimes I get strangled".  She endorsed coughing was due to juice from pears vs actual solid. No other overt or subtle s/s of aspiration observed; however, PO intake was limited.    Recommend continue current diet as tolerated at this time. SLP will follow up x1 more to further assess diet tolerance and modify diet if indicated.  Will consider instrumental swallow assessment if concerns for aspiration are noted with Dys 2 solids or liquids.     HPI HPI: MYLEEN FINFROCK is a 89 yo female presenting to ED 11/15 with AMS and R knee pain after a fall. CTH negative. MRI Brain revealed 1. No acute intracranial abnormality.  2. Progressive advanced atrophy and diffuse white matter disease  likely reflects the sequela of chronic microvascular ischemia.  3. Remote lacunar infarcts of the  thalami bilaterally.  4. Remote posterior and inferior cerebellar infarcts bilaterally. PMH includes HTN, DM, thyroid disease      SLP Plan  Continue with current plan of care      Recommendations for follow up therapy are one component of a multi-disciplinary discharge planning process, led by the attending physician.  Recommendations may be updated based on patient status, additional functional criteria and insurance authorization.    Recommendations  Diet recommendations: Dysphagia 2 (fine chop);Thin liquid Liquids provided via: Cup;Straw Medication Administration: Whole meds with puree Supervision: Patient able to self feed Compensations: Minimize environmental distractions;Slow rate;Small sips/bites Postural Changes and/or Swallow Maneuvers: Seated upright 90 degrees;Out of bed for meals                  Oral care BID    (defer to PT/OT recommendations) Dysphagia, unspecified (R13.10)     Continue with current plan of care     Ellery Plunk  08/18/2023, 10:13 AM

## 2023-08-18 NOTE — Progress Notes (Signed)
Subjective: Jennifer Zhang is a 89 y.o. female with PMH of hypertension, diabetes mellitus, thyroid disease, AMS. Patient lives alone. Reportedly, family found patient on the floor 11/15 and reports that she has been more confused for the last few weeks.    Met patient bedside this morning with IMTS rounding team under attending physician Dr Charissa Bash. The patient was visibly brighter than yesterday in the emergency department and was able to participate more fully in the interview. Patient said she was feeling "well" and that she slept okay overnight. She was not sure why she was in the hospital but seemed to be feeling well.   Spoke with daughter, collateral provided below.   Objective:  Vital signs in last 24 hours: Vitals:   08/17/23 1547 08/17/23 1944 08/18/23 0427 08/18/23 0732  BP: (!) 158/84 (!) 160/62 (!) 143/78 (!) 143/75  Pulse: 72 76 77 80  Resp: 16 16 17 16   Temp: 97.9 F (36.6 C) 98.1 F (36.7 C) 98.2 F (36.8 C) 97.8 F (36.6 C)  TempSrc: Oral Oral    SpO2: 97% 98% 98% 100%  Weight:      Height:       Physical Exam Vitals and nursing note reviewed.  Constitutional:      General: She is not in acute distress.    Appearance: Normal appearance. She is normal weight. She is not ill-appearing or toxic-appearing.  HENT:     Head: Normocephalic and atraumatic.  Eyes:     Conjunctiva/sclera: Conjunctivae normal.  Pulmonary:     Effort: Pulmonary effort is normal.  Skin:    General: Skin is warm and dry.     Capillary Refill: Capillary refill takes less than 2 seconds.     Findings: Bruising present.     Comments: Bruises on her posterior forearms, bruising on her shins. Would NOT characterize these as defensive wounds. Skin otherwise in excellent condition.   Neurological:     Mental Status: She is alert. She is disoriented and confused.     Sensory: Sensation is intact.     Motor: Motor function is intact.     Comments: Pt knows name.  Pt knows  hospital. Pt believes year is 1963 Pt believes month is March. Difficult to re-orient.  Psychiatric:        Attention and Perception: She perceives visual hallucinations.        Mood and Affect: Mood normal.        Speech: Speech is slurred and tangential.        Behavior: Behavior is cooperative.        Cognition and Memory: Cognition is impaired. Memory is impaired.     Comments: Pt reports having experiences of bugs flying around her and bothering her greatly. Pt reports that she has people who are in her house sometimes and go under her bed and surprise her. They do not scare her but they confuse her.     Collateral: called patient's next of kin (daughter, Jennifer Zhang, [phone]) at 1330 on 11/16 - Shared with her the results of the workup thus far and our assessment that this might be an acute delirium on top of chronic dementia process. Explained some of the different characteristics of dementia and how we understand what is happening with her mother. Mentioned that we are not sure whether it is a great idea for the pt to live on her own anymore. Daughter already has the name of one assisted living facility that she  would like the patient to go to [facility] [phone]). Mentioned that it might be appropriate to consider discontinuing some of the patient's long term preventative health medications (statin, lisinopril, or verapamil), daughter wanted the team to talk to the patient's PCP before making any med changes of that sort. Estimated time on phone 30 minutes.   Assessment/Plan:  Principal Problem:   Altered mental state Active Problems:   Type 2 diabetes mellitus without complication (HCC)   HTN (hypertension)   Hypothyroidism   Hypokalemia   Normocytic anemia   Hyperlipidemia  Delirium superimposed on dementia, Vascular Dementia vs Suspected Lewy Body Dementia Pt was admitted for mental status changes. These changes seem to have both acute and  chronic components. She has had a gradual decline in function since October, the patient was disoriented and truly altered when found by her daughter on 11/15. MRI from 2021 showed some intracranial chronic small vessel ischemia. MRI overnight confirmed that the patient has had thalamic and cerebellar infarcts, along with white matter degeneration and chronic small vessel disease. Pt's slurred speech has been present for approximately one month along with vision and coordination changes. Pt also endorses visual hallucinations of people in her home that she does not know, and persistent seeing bugs flying around her that no one else can see. Shared results of findings with patient's daughter as above. - Admit to IMTS, attending Dr Charissa Bash - Regular diet - CBGs per floor protocol - Potassium repleted orally, will add potassium supplement for patient long term. - Discontinuing the ordered echocardiogram    FEN: Normal VTE: Heparin subcutaneous  Code status: Full code, MOST document posted. Dispo: SNF vs Home with Home health.  Prior to Admission Living Arrangement: Home alone Anticipated Discharge Location: SNF/Long term care facility Barriers to Discharge: Safe discharge plan Dispo: Anticipated discharge in approximately 2 day(s).   Margaretmary Dys, MD 08/18/2023, 1:18 PM Pager: [phone] After 5pm on weekdays and 1pm on weekends: On Call pager [phone]

## 2023-08-19 LAB — GLUCOSE, CAPILLARY
Glucose-Capillary: 147 mg/dL — ABNORMAL HIGH (ref 70–99)
Glucose-Capillary: 134 mg/dL — ABNORMAL HIGH (ref 70–99)
Glucose-Capillary: 94 mg/dL (ref 70–99)
Glucose-Capillary: 229 mg/dL — ABNORMAL HIGH (ref 70–99)

## 2023-08-19 LAB — BASIC METABOLIC PANEL
Anion gap: 6 (ref 5–15)
BUN: 12 mg/dL (ref 8–23)
CO2: 28 mmol/L (ref 22–32)
Calcium: 8.6 mg/dL — ABNORMAL LOW (ref 8.9–10.3)
Chloride: 106 mmol/L (ref 98–111)
Creatinine, Ser: 0.68 mg/dL (ref 0.44–1.00)
GFR, Estimated: >60 mL/min (ref >60)
Glucose, Bld: 102 mg/dL — ABNORMAL HIGH (ref 70–99)
Potassium: 3.7 mmol/L (ref 3.5–5.1)
Sodium: 140 mmol/L (ref 135–145)

## 2023-08-19 LAB — MAGNESIUM: Magnesium: 2 mg/dL (ref 1.7–2.4)

## 2023-08-19 NOTE — Progress Notes (Signed)
Contact info for [facility] provided by family was placed in physical chart.

## 2023-08-19 NOTE — TOC Initial Note (Signed)
Transition of Care [facility]) - Initial/Assessment Note    Patient Details  Name: Jennifer Zhang MRN: [id number] Date of Birth: Oct 19, 1925  Transition of Care [facility]) CM/SW Contact:    Baldemar Lenis, LCSW Phone Number: 08/19/2023, 10:22 AM  Clinical Narrative:      Patient from senior apartment alone, daughter visits daily. Per chart review, noting that patient and family interested in SNF, preference for [facility]. CSW attempted to contact daughter, Pearly, to verify, left a voicemail. Referral completed and faxed out for review, awaiting responses. CSW to follow.             Expected Discharge Plan: Skilled Nursing Facility Barriers to Discharge: Continued Medical Work up, English as a second language teacher   Patient Goals and CMS Choice Patient states their goals for this hospitalization and ongoing recovery are:: patient unable to participate in goal setting, not fully oriented CMS Medicare.gov Compare Post Acute Care list provided to:: Patient Represenative (must comment) Choice offered to / list presented to : Adult Children [place] ownership interest in [facility].provided to:: Adult Children    Expected Discharge Plan and Services     Post Acute Care Choice: Skilled Nursing Facility Living arrangements for the past 2 months: Apartment                                      Prior Living Arrangements/Services Living arrangements for the past 2 months: Apartment Lives with:: Self Patient language and need for interpreter reviewed:: No Do you feel safe going back to the place where you live?: Yes      Need for Family Participation in Patient Care: Yes (Comment) Care giver support system in place?: No (comment)   Criminal Activity/Legal Involvement Pertinent to Current Situation/Hospitalization: No - Comment as needed  Activities of Daily Living   ADL Screening (condition at time of admission) Independently performs ADLs?: No Does the patient have a NEW  difficulty with bathing/dressing/toileting/self-feeding that is expected to last >3 days?: No Does the patient have a NEW difficulty with getting in/out of bed, walking, or climbing stairs that is expected to last >3 days?: No Does the patient have a NEW difficulty with communication that is expected to last >3 days?: No Is the patient deaf or have difficulty hearing?: No Does the patient have difficulty seeing, even when wearing glasses/contacts?: No Does the patient have difficulty concentrating, remembering, or making decisions?: Yes  Permission Sought/Granted Permission sought to share information with : Facility Medical sales representative, Family Supports Permission granted to share information with : Yes, Verbal Permission Granted  Share Information with NAME: Pearly  Permission granted to share info w AGENCY: SNF  Permission granted to share info w Relationship: Daughter     Emotional Assessment   Attitude/Demeanor/Rapport: Unable to Assess Affect (typically observed): Unable to Assess Orientation: : Oriented to Self, Oriented to Place Alcohol / Substance Use: Not Applicable Psych Involvement: No (comment)  Admission diagnosis:  Hypokalemia [E87.6] Confusion [R41.0] Altered mental state [R41.82] Fall in home, initial encounter [W19.Lorne Skeens, Y92.009] Patient Active Problem List   Diagnosis Date Noted   Delirium superimposed on dementia 08/17/2023   Bigeminy 07/05/2023   Hypokalemia 07/05/2023   Hypomagnesemia 07/05/2023   Abnormal chest x-ray 07/05/2023   Normocytic anemia 07/05/2023   Hyperlipidemia 07/05/2023   Orthostatic dizziness 09/01/2020   Dizziness 08/30/2020   Hav (hallux abducto valgus), unspecified laterality 03/30/2020   Pes planus 03/30/2020  Near syncope 12/22/2019   Type 2 diabetes mellitus without complication (HCC) 12/21/2019   HTN (hypertension) 12/21/2019   Hypothyroidism 12/21/2019   Chronic swimmer's ear of both sides 12/05/2018   Impacted cerumen of  both ears 12/05/2018   Sensorineural hearing loss (SNHL), bilateral 12/05/2018   PCP:  Nona Dell, NP Pharmacy:   [facility] [phone] - [place], [place] - [address] [facility] [address] [place] [address] Phone: [phone] Fax: [phone]  [facility] [address] [place] [address] Phone: [phone] Fax: [phone]     Social Determinants of Health (SDOH) Social History: SDOH Screenings   Food Insecurity: No Food Insecurity (08/17/2023)  Housing: Patient Declined (08/17/2023)  Transportation Needs: No Transportation Needs (08/17/2023)  Utilities: Not At Risk (08/17/2023)  Tobacco Use: Medium Risk (08/17/2023)   SDOH Interventions:     Readmission Risk Interventions     No data to display

## 2023-08-19 NOTE — Progress Notes (Addendum)
HD#2 Subjective:   Summary: This is a 89 year old female with a past medical history of hypertension, diabetes mellitus, hypothyroidism who presented for altered mental status.  Patient likely found to have delirium on top of dementia and admitted for further evaluation management.  Overnight Events: No acute events overnight  Patient evaluated at bedside this morning.  She states she is doing well.  She has no concerns this morning.  She states she is eating and drinking well.  She states she had bowel movement with no concerns.  She denies any abdominal pain.  Objective:  Vital signs in last 24 hours: Vitals:   08/18/23 2018 08/18/23 2140 08/18/23 2356 08/19/23 0419  BP: (!) 184/84 (!) 152/67 (!) 147/65 (!) 144/69  Pulse: 95 91 87 85  Resp: 17 18 17 18   Temp: 98.5 F (36.9 C) 98 F (36.7 C) 98.4 F (36.9 C) 98.5 F (36.9 C)  TempSrc: Axillary Axillary Axillary Oral  SpO2: 99% 98% 96% 96%  Weight:      Height:       Supplemental O2: Room Air SpO2: 96 %   Physical Exam:  Constitutional: Resting in bed, no acute distress Cardiovascular: Regular rate and rhythm, no murmurs, rubs, or gallops Pulmonary/Chest: Clear to auscultation bilaterally Neuro: Alert and oriented x 2, able to follow all instructions, tracking appropriately  Filed Weights   08/17/23 1152  Weight: 68 kg     Intake/Output Summary (Last 24 hours) at 08/19/2023 0623 Last data filed at 08/18/2023 1438 Gross per 24 hour  Intake 355 ml  Output --  Net 355 ml   Net IO Since Admission: 355 mL [08/19/23 0623]  Pertinent Labs:    Latest Ref Rng & Units 08/18/2023    5:26 AM 08/17/2023   11:44 AM 07/06/2023    5:36 AM  CBC  WBC 4.0 - 10.5 K/uL 7.8  9.6  9.0   Hemoglobin 12.0 - 15.0 g/dL 9.8  13.0  9.0   Hematocrit 36.0 - 46.0 % 30.4  32.4  27.9   Platelets 150 - 400 K/uL 205  232  190        Latest Ref Rng & Units 08/19/2023    4:14 AM 08/18/2023    5:26 AM 08/18/2023   12:54 AM  CMP   Glucose 70 - 99 mg/dL 865  72    BUN 8 - 23 mg/dL 12  11    Creatinine 7.84 - 1.00 mg/dL 6.96  2.95    Sodium 284 - 145 mmol/L 140  141    Potassium 3.5 - 5.1 mmol/L 3.7  2.9  3.1   Chloride 98 - 111 mmol/L 106  104    CO2 22 - 32 mmol/L 28  26    Calcium 8.9 - 10.3 mg/dL 8.6  8.5    Total Protein 6.5 - 8.1 g/dL  5.6    Total Bilirubin <1.2 mg/dL  1.2    Alkaline Phos 38 - 126 U/L  33    AST 15 - 41 U/L  20    ALT 0 - 44 U/L  16      Imaging: No results found.  Assessment/Plan:   Principal Problem:   Delirium superimposed on dementia Active Problems:   Type 2 diabetes mellitus without complication (HCC)   HTN (hypertension)   Hypothyroidism   Hypokalemia   Normocytic anemia   Hyperlipidemia   Patient Summary: NICCI MOSSER is a 89 y.o. with a pertinent PMH of hypertension, diabetes, thyroid  disease who presents with concerns about altered mental status.  Patient found to have delirium with concern for subsequent dementia.  #Delirium superimposed on dementia #Vascular dementia versus Lewy body dementia Patient is appropriate today.  Able to converse with me.  She is alert and oriented x 1.  She thinks it is 1950.  She states she has been moving with no concerns.  Slurred speech is improving.  She denies any concerns this morning.  Will concern for progression of dementia, likely patient will need higher level of care.  PT/OT is recommending SNF. -TOC following for SNF placement -Regular diet -Patient at baseline  #Hypothyroidism Continue levothyroxine 50 mcg daily  #Hypertension Blood pressure is measuring slightly elevated into the 140s.  Will continue home medications. -Continue lisinopril 10 mg daily -Continue verapamil 240 mg daily  #Hyperlipidemia Continue atorvastatin 40 mg daily  #Diabetes mellitus -Glucose has been measuring well during hospitalization.  Continue to monitor  #Hypokalemia, resolved Potassium 3.7 this morning.  No acute  concerns. -Continue to monitor electrolytes  Diet:  Dysphagia 2 diet IVF: N/A VTE: Heparin Code: Full PT/OT recs: SNF for Subacute PT  Dispo: Anticipated discharge to Skilled nursing facility in 2 days pending placement.   Modena Slater DO Internal Medicine Resident PGY-2 [phone] Please contact the on call pager after 5 pm and on weekends at [phone].

## 2023-08-19 NOTE — NC FL2 (Signed)
[place] MEDICAID FL2 LEVEL OF CARE FORM     IDENTIFICATION  Patient Name: Jennifer Zhang Birthdate: 25-Feb-1926 Sex: female Admission Date (Current Location): 08/17/2023  [facility] and [place] Number:  Producer, television/film/video and Address:  [facility] [place]. [facility], [address], [place], [place] [address]      Provider Number: [id number]  Attending Physician Name and Address:  Miguel Aschoff, MD*  Relative Name and Phone Number:       Current Level of Care: Hospital Recommended Level of Care: Skilled Nursing Facility Prior Approval Number:    Date Approved/Denied:   PASRR Number: [id number]  Discharge Plan: SNF    Current Diagnoses: Patient Active Problem List   Diagnosis Date Noted   Delirium superimposed on dementia 08/17/2023   Bigeminy 07/05/2023   Hypokalemia 07/05/2023   Hypomagnesemia 07/05/2023   Abnormal chest x-ray 07/05/2023   Normocytic anemia 07/05/2023   Hyperlipidemia 07/05/2023   Orthostatic dizziness 09/01/2020   Dizziness 08/30/2020   Hav (hallux abducto valgus), unspecified laterality 03/30/2020   Pes planus 03/30/2020   Near syncope 12/22/2019   Type 2 diabetes mellitus without complication (HCC) 12/21/2019   HTN (hypertension) 12/21/2019   Hypothyroidism 12/21/2019   Chronic swimmer's ear of both sides 12/05/2018   Impacted cerumen of both ears 12/05/2018   Sensorineural hearing loss (SNHL), bilateral 12/05/2018    Orientation RESPIRATION BLADDER Height & Weight     Self, Place  Normal Continent Weight: 150 lb (68 kg) Height:  5' (152.4 cm)  BEHAVIORAL SYMPTOMS/MOOD NEUROLOGICAL BOWEL NUTRITION STATUS      Continent Diet (see DC summary)  AMBULATORY STATUS COMMUNICATION OF NEEDS Skin   Limited Assist Verbally Normal                       Personal Care Assistance Level of Assistance  Bathing, Feeding, Dressing Bathing Assistance: Limited assistance Feeding assistance: Limited assistance Dressing  Assistance: Limited assistance     Functional Limitations Info  Sight, Hearing, Speech Sight Info: Impaired Hearing Info: Impaired Speech Info: Impaired (dysarthria)    SPECIAL CARE FACTORS FREQUENCY  PT (By licensed PT), OT (By licensed OT)     PT Frequency: 5x/wk OT Frequency: 5x/wk            Contractures Contractures Info: Not present    Additional Factors Info  Code Status, Allergies Code Status Info: Full Allergies Info: Tape, Aspirin           Current Medications (08/19/2023):  This is the current hospital active medication list Current Facility-Administered Medications  Medication Dose Route Frequency Provider Last Rate Last Admin   acetaminophen (TYLENOL) tablet 650 mg  650 mg Oral Q6H PRN Modena Slater, DO       Or   acetaminophen (TYLENOL) suppository 650 mg  650 mg Rectal Q6H PRN Modena Slater, DO       atorvastatin (LIPITOR) tablet 40 mg  40 mg Oral QHS Patel, Azucena Cecil, DO   40 mg at 08/18/23 2248   heparin injection 5,000 Units  5,000 Units Subcutaneous Q8H Patel, Azucena Cecil, DO   5,000 Units at 08/19/23 0524   levothyroxine (SYNTHROID) tablet 50 mcg  50 mcg Oral QAC breakfast Modena Slater, DO   50 mcg at 08/19/23 0521   lisinopril (ZESTRIL) tablet 10 mg  10 mg Oral Daily Modena Slater, DO   10 mg at 08/19/23 7846   senna-docusate (Senokot-S) tablet 1 tablet  1 tablet Oral QHS PRN Modena Slater, DO  verapamil (CALAN-SR) CR tablet 240 mg  240 mg Oral Daily Modena Slater, DO   240 mg at 08/19/23 4010     Discharge Medications: Please see discharge summary for a list of discharge medications.  Relevant Imaging Results:  Relevant Lab Results:   Additional Information SS#: [id number]  Baldemar Lenis, LCSW

## 2023-08-19 NOTE — Plan of Care (Signed)

## 2023-08-20 DIAGNOSIS — F0390 Unspecified dementia without behavioral disturbance: Secondary | ICD-10-CM | POA: Diagnosis not present

## 2023-08-20 DIAGNOSIS — F05 Delirium due to known physiological condition: Secondary | ICD-10-CM | POA: Diagnosis not present

## 2023-08-20 LAB — BASIC METABOLIC PANEL
Anion gap: 6 (ref 5–15)
BUN: 13 mg/dL (ref 8–23)
CO2: 27 mmol/L (ref 22–32)
Calcium: 8.8 mg/dL — ABNORMAL LOW (ref 8.9–10.3)
Chloride: 106 mmol/L (ref 98–111)
Creatinine, Ser: 0.78 mg/dL (ref 0.44–1.00)
GFR, Estimated: >60 mL/min (ref >60)
Glucose, Bld: 115 mg/dL — ABNORMAL HIGH (ref 70–99)
Potassium: 3.8 mmol/L (ref 3.5–5.1)
Sodium: 139 mmol/L (ref 135–145)

## 2023-08-20 LAB — GLUCOSE, CAPILLARY
Glucose-Capillary: 114 mg/dL — ABNORMAL HIGH (ref 70–99)
Glucose-Capillary: 141 mg/dL — ABNORMAL HIGH (ref 70–99)
Glucose-Capillary: 181 mg/dL — ABNORMAL HIGH (ref 70–99)

## 2023-08-20 NOTE — Progress Notes (Signed)
Speech Language Pathology Treatment: Dysphagia  Patient Details Name: Jennifer Zhang MRN: [id number] DOB: 18-Nov-1925 Today's Date: 08/20/2023 Time: 4098-1191 SLP Time Calculation (min) (ACUTE ONLY): 9 min  Assessment / Plan / Recommendation Clinical Impression  Pt's daughter present at bedside, reporting that she typically chops pt's POs finely. Attempted trials of advanced solids with moderately prolonged mastication and pt expectorating minimal amounts of oral residue. Dys 2 texture solids and thin liquids observed with no s/s of aspiration this date. Current recommendations remain most appropriate and most closely resemble pt's baseline. She will continue to require full supervision and assistance with feeding. No further needs identified. SLP to s/o at this time.    HPI HPI: Jennifer Zhang is a 89 yo female presenting to ED 11/15 with AMS and R knee pain after a fall. CTH negative. MRI Brain revealed 1. No acute intracranial abnormality.  2. Progressive advanced atrophy and diffuse white matter disease  likely reflects the sequela of chronic microvascular ischemia.  3. Remote lacunar infarcts of the thalami bilaterally.  4. Remote posterior and inferior cerebellar infarcts bilaterally. PMH includes HTN, DM, thyroid disease      SLP Plan  All goals met      Recommendations for follow up therapy are one component of a multi-disciplinary discharge planning process, led by the attending physician.  Recommendations may be updated based on patient status, additional functional criteria and insurance authorization.    Recommendations  Diet recommendations: Dysphagia 2 (fine chop);Thin liquid Liquids provided via: Cup;Straw Medication Administration: Whole meds with puree Supervision: Patient able to self feed Compensations: Minimize environmental distractions;Slow rate;Small sips/bites Postural Changes and/or Swallow Maneuvers: Seated upright 90 degrees;Out of bed for meals                   Oral care BID   Frequent or constant Supervision/Assistance Dysphagia, unspecified (R13.10)     All goals met     Gwynneth Aliment, M.A., CF-SLP Speech Language Pathology, Acute Rehabilitation Services  Secure Chat preferred [phone]   08/20/2023, 5:12 PM

## 2023-08-20 NOTE — Care Management Important Message (Signed)
Important Message  Patient Details  Name: Jennifer Zhang MRN: [id number] Date of Birth: June 13, 1926   Important Message Given:  Yes - Medicare IM     Dorena Bodo 08/20/2023, 3:12 PM

## 2023-08-20 NOTE — Progress Notes (Signed)
Occupational Therapy Treatment Patient Details Name: Jennifer Zhang MRN: [id number] DOB: July 21, 1926 Today's Date: 08/20/2023   History of present illness 89 yo female [facility]15 for mechanical fall and c/o right knee pain. Found to have Altered Mental Status.  PMH includes HTN, hypothyroidism, DMII.   OT comments  Pt up in chair. Stood with min assist and RW, ambulated to [facility] for toileting. Completed toileting with min assist and 2 grooming activities at sink with CGA. Pt returned to chair, assisted in finding her favorite TV channel. Patient will benefit from continued inpatient follow up therapy, <3 hours/day unless family can provide adequate assistance. Per pt, her daughter is not able to assist as she has to help her granddaughter.      If plan is discharge home, recommend the following:  A little help with walking and/or transfers;A lot of help with bathing/dressing/bathroom;Assistance with cooking/housework;Direct supervision/assist for medications management;Direct supervision/assist for financial management;Assist for transportation;Help with stairs or ramp for entrance   Equipment Recommendations  Other (comment) (defer)    Recommendations for Other Services      Precautions / Restrictions Precautions Precautions: Fall Restrictions Weight Bearing Restrictions: No       Mobility Bed Mobility               General bed mobility comments: in chair    Transfers Overall transfer level: Needs assistance Equipment used: Rolling walker (2 wheels) Transfers: Sit to/from Stand Sit to Stand: Min assist           General transfer comment: cues to scoot hips to edge of chair and for hand placement, pt uses momentum with min assist to rise     Balance Overall balance assessment: Needs assistance   Sitting balance-Leahy Scale: Good       Standing balance-Leahy Scale: Poor Standing balance comment: can release walker at sink for grooming with CGA                            ADL either performed or assessed with clinical judgement   ADL Overall ADL's : Needs assistance/impaired     Grooming: Oral care;Wash/dry hands;Standing;Contact guard assist           Upper Body Dressing : Minimal assistance;Sitting       Toilet Transfer: Minimal assistance;Ambulation;Rolling walker (2 wheels);BSC/3in1   Toileting- Clothing Manipulation and Hygiene: Minimal assistance;Sit to/from stand              Extremity/Trunk Assessment              Vision       Perception     Praxis      Cognition Arousal: Alert Behavior During Therapy: WFL for tasks assessed/performed Overall Cognitive Status: No family/caregiver present to determine baseline cognitive functioning                                 General Comments: poor historian        Exercises      Shoulder Instructions       General Comments      Pertinent Vitals/ Pain       Pain Assessment Pain Assessment: No/denies pain  Home Living  Prior Functioning/Environment              Frequency  Min 1X/week        Progress Toward Goals  OT Goals(current goals can now be found in the care plan section)  Progress towards OT goals: Progressing toward goals  Acute Rehab OT Goals OT Goal Formulation: With patient Time For Goal Achievement: 09/01/23 Potential to Achieve Goals: Good  Plan      Co-evaluation                 AM-PAC OT "6 Clicks" Daily Activity     Outcome Measure   Help from another person eating meals?: None Help from another person taking care of personal grooming?: A Little Help from another person toileting, which includes using toliet, bedpan, or urinal?: A Little Help from another person bathing (including washing, rinsing, drying)?: A Lot Help from another person to put on and taking off regular upper body clothing?: A Little Help from  another person to put on and taking off regular lower body clothing?: A Lot 6 Click Score: 17    End of Session Equipment Utilized During Treatment: Rolling walker (2 wheels);Gait belt  OT Visit Diagnosis: Unsteadiness on feet (R26.81);Other abnormalities of gait and mobility (R26.89);Muscle weakness (generalized) (M62.81);Other symptoms and signs involving cognitive function   Activity Tolerance Patient tolerated treatment well   Patient Left in chair;with call bell/phone within reach;with chair alarm set   Nurse Communication          Time: 4098-1191 OT Time Calculation (min): 15 min  Charges: OT General Charges $OT Visit: 1 Visit OT Treatments $Self Care/Home Management : 8-22 mins  Berna Spare, OTR/L Acute Rehabilitation Services Office: [phone]   Evern Bio 08/20/2023, 1:28 PM

## 2023-08-20 NOTE — Progress Notes (Signed)
Mobility Specialist Progress Note:    08/20/23 1403  Mobility  Activity Transferred from chair to bed  Level of Assistance Moderate assist, patient does 50-74%  Assistive Device Front wheel walker  Distance Ambulated (ft) 4 ft  Activity Response Tolerated well  Mobility Referral Yes  $Mobility charge 1 Mobility  Mobility Specialist Start Time (ACUTE ONLY) 1355  Mobility Specialist Stop Time (ACUTE ONLY) 1402  Mobility Specialist Time Calculation (min) (ACUTE ONLY) 7 min   Pt requested assistance to transfer C>B. Required RW and ModA to stand. Pt is confused and requires redirecting, very pleasant. Tolerated well, asx throughout. Left pt in bed with all needs met, call bell in reach and alarm on.   Feliciana Rossetti Mobility Specialist Please contact via Special educational needs teacher or  Rehab office at [phone]

## 2023-08-20 NOTE — TOC Progression Note (Signed)
Transition of Care [facility]) - Progression Note    Patient Details  Name: Jennifer Zhang MRN: [id number] Date of Birth: 1925/11/13  Transition of Care [facility]) CM/SW Contact  Dianey Suchy A [place], [place] Phone Number: 08/20/2023, 2:52 PM  Clinical Narrative:     CSW spoke with Alvino Chapel at [facility], pt's preference facility. She said that pt had some concerning behaviors but would consider her if no other issues today or tonight. CSW to follow up with Phineas Semen tomorrow regarding bed offer.    TOC will continue to follow.   Expected Discharge Plan: Skilled Nursing Facility Barriers to Discharge: Continued Medical Work up, English as a second language teacher  Expected Discharge Plan and Services     Post Acute Care Choice: Skilled Nursing Facility Living arrangements for the past 2 months: Apartment                                       Social Determinants of Health (SDOH) Interventions SDOH Screenings   Food Insecurity: No Food Insecurity (08/17/2023)  Housing: Patient Declined (08/17/2023)  Transportation Needs: No Transportation Needs (08/17/2023)  Utilities: Not At Risk (08/17/2023)  Tobacco Use: Medium Risk (08/17/2023)    Readmission Risk Interventions     No data to display

## 2023-08-20 NOTE — Progress Notes (Signed)
HD#3 Subjective:   Summary: This is a 89 year old female with a past medical history of hypertension, diabetes mellitus, hypothyroidism who presented for altered mental status.  Patient likely found to have delirium on top of dementia and admitted for further evaluation management.  Overnight Events: No acute events overnight  Patient evaluated at bedside this morning.  She states she is doing well.  She has no concerns this morning.  She states she is eating and drinking well.  She states she had bowel movement with no concerns.  She denies any abdominal pain. She wishes to discharge home and not to an escalated level of care.  Objective:  Vital signs in last 24 hours: Vitals:   08/19/23 2020 08/20/23 0018 08/20/23 0356 08/20/23 0425  BP: 130/62 139/69 (!) 175/96 (!) 141/66  Pulse: 85 74 90 70  Resp: 18 18 18    Temp: 97.8 F (36.6 C) 97.9 F (36.6 C) (!) 97.3 F (36.3 C)   TempSrc: Oral Oral Oral   SpO2: 100% 100% 100%   Weight:      Height:       Supplemental O2: Room Air SpO2: 100 %   Physical Exam:  Constitutional: Resting in bed, no acute distress Cardiovascular: Regular rate and rhythm, no murmurs, rubs, or gallops Neuro: Alert and oriented x 2, able to follow all instructions, tracking appropriately  Filed Weights   08/17/23 1152  Weight: 68 kg     Intake/Output Summary (Last 24 hours) at 08/20/2023 0720 Last data filed at 08/19/2023 8119 Gross per 24 hour  Intake 240 ml  Output --  Net 240 ml   Net IO Since Admission: 595 mL [08/20/23 0720]  Pertinent Labs:    Latest Ref Rng & Units 08/18/2023    5:26 AM 08/17/2023   11:44 AM 07/06/2023    5:36 AM  CBC  WBC 4.0 - 10.5 K/uL 7.8  9.6  9.0   Hemoglobin 12.0 - 15.0 g/dL 9.8  14.7  9.0   Hematocrit 36.0 - 46.0 % 30.4  32.4  27.9   Platelets 150 - 400 K/uL 205  232  190        Latest Ref Rng & Units 08/20/2023    5:35 AM 08/19/2023    4:14 AM 08/18/2023    5:26 AM  CMP  Glucose 70 - 99 mg/dL  829  562  72   BUN 8 - 23 mg/dL 13  12  11    Creatinine 0.44 - 1.00 mg/dL 1.30  8.65  7.84   Sodium 135 - 145 mmol/L 139  140  141   Potassium 3.5 - 5.1 mmol/L 3.8  3.7  2.9   Chloride 98 - 111 mmol/L 106  106  104   CO2 22 - 32 mmol/L 27  28  26    Calcium 8.9 - 10.3 mg/dL 8.8  8.6  8.5   Total Protein 6.5 - 8.1 g/dL   5.6   Total Bilirubin <1.2 mg/dL   1.2   Alkaline Phos 38 - 126 U/L   33   AST 15 - 41 U/L   20   ALT 0 - 44 U/L   16     Imaging: No results found.  Assessment/Plan:   Principal Problem:   Delirium superimposed on dementia Active Problems:   Type 2 diabetes mellitus without complication (HCC)   HTN (hypertension)   Hypothyroidism   Hypokalemia   Normocytic anemia   Hyperlipidemia   Patient Summary: Jennifer Zhang  is a 89 y.o. with a pertinent PMH of hypertension, diabetes, thyroid disease who presents with concerns about altered mental status. Patient found to have delirium with concern for subsequent dementia.  #Delirium superimposed on dementia #Vascular dementia versus Lewy body dementia Patient is appropriate today.  Able to converse with me.  She is alert and oriented x 1.  She thinks it is [birth year], 1953.  She states she has been moving with no concerns.  Slurred speech is improving.  She denies any concerns this morning.  With concern for progression of dementia, likely patient will need higher level of care. PT/OT is recommending SNF. -TOC has identified 3 potential bed offers.  -Regular diet -Patient at baseline  #Hypothyroidism Continue levothyroxine 50 mcg daily  #Hypertension Blood pressure is measuring slightly elevated into the 140s.  Will continue home medications. -Continue lisinopril 10 mg daily -Continue verapamil 240 mg daily  #Hyperlipidemia Continue atorvastatin 40 mg daily  #Diabetes mellitus -Glucose has been measuring well during hospitalization.  Continue to monitor  #Hypokalemia, resolved Potassium 3.7 this morning.  No  acute concerns. -Continue to monitor electrolytes  Diet:  Dysphagia 2 diet IVF: N/A VTE: Heparin Code: Full PT/OT recs: SNF for Subacute PT  Dispo: Anticipated discharge to Skilled nursing facility in 2 days pending placement.   Signed: Christie Nottingham, MD [facility] Health Physician, PGY-1 08/20/2023 1:19 PM

## 2023-08-20 NOTE — Progress Notes (Signed)
Physical Therapy Treatment Patient Details Name: Jennifer Zhang MRN: [id number] DOB: [birth year] Today's Date: 08/20/2023   History of Present Illness 89 yo female presents to [facility] on 11/15 for mechanical fall and c/o right knee pain. Found to have Altered Mental Status.  PMH includes HTN, hypothyroidism, DMII.    PT Comments  Tolerated well, eager to mobilize out of bed. Min assist for transfer and gait. Max VC for step symmetry with poor sustainability to techniques practiced. Heavily flexed trunk and shuffled steps, holding RW for support. Reviewed LE exercises with pt, encouraged performing routinely. Patient will benefit from continued inpatient follow up therapy, <3 hours/day. Patient will continue to benefit from skilled physical therapy services to further improve independence with functional mobility.     If plan is discharge home, recommend the following: A little help with walking and/or transfers;A little help with bathing/dressing/bathroom;Assistance with cooking/housework;Direct supervision/assist for medications management;Direct supervision/assist for financial management;Assist for transportation;Supervision due to cognitive status   Can travel by private vehicle     Yes  Equipment Recommendations  None recommended by PT    Recommendations for Other Services       Precautions / Restrictions Precautions Precautions: Fall Restrictions Weight Bearing Restrictions: No     Mobility  Bed Mobility Overal bed mobility: Needs Assistance Bed Mobility: Supine to Sit     Supine to sit: Contact guard     General bed mobility comments: CGA for safety, no physical assist.    Transfers Overall transfer level: Needs assistance Equipment used: Rolling walker (2 wheels) Transfers: Sit to/from Stand Sit to Stand: Min assist           General transfer comment: Min assist for boost to stand from low bed setting. Rocks for momentum, cues for hand placement which helped.  Cues for aligment and safety prior to sitting but pushes RW away despite cues and education.`    Ambulation/Gait Ambulation/Gait assistance: Min Chemical engineer (Feet): 65 Feet Assistive device: Rolling walker (2 wheels) Gait Pattern/deviations: Drifts right/left, Trunk flexed, Step-to pattern, Decreased stride length, Decreased dorsiflexion - right, Decreased dorsiflexion - left, Shuffle Gait velocity: dec Gait velocity interpretation: <1.31 ft/sec, indicative of household ambulator   General Gait Details: Max VC for gait symmetry, upright posture and proximity to device. Requires intermittent min assist for RW control and tactile cues for alignment. No overt buckling. Erratic RW control.   Stairs             Wheelchair Mobility     Tilt Bed    Modified Rankin (Stroke Patients Only)       Balance Overall balance assessment: Needs assistance Sitting-balance support: No upper extremity supported, Feet supported Sitting balance-Leahy Scale: Good     Standing balance support: Bilateral upper extremity supported Standing balance-Leahy Scale: Poor                              Cognition Arousal: Alert Behavior During Therapy: WFL for tasks assessed/performed Overall Cognitive Status: No family/caregiver present to determine baseline cognitive functioning                                          Exercises General Exercises - Lower Extremity Ankle Circles/Pumps: AROM, Both, 10 reps, Supine Quad Sets: Strengthening, Both, 10 reps, Supine Gluteal Sets: Strengthening, Both, 10 reps, Seated Long Arc Quad:  Strengthening, Both, 10 reps, Seated Heel Slides: Strengthening, Both, 10 reps, Seated    General Comments        Pertinent Vitals/Pain Pain Assessment Pain Assessment: No/denies pain Pain Intervention(s): Monitored during session    Home Living                          Prior Function            PT Goals  (current goals can now be found in the care plan section) Acute Rehab PT Goals Patient Stated Goal: Get stronger PT Goal Formulation: With patient Time For Goal Achievement: 09/01/23 Potential to Achieve Goals: Good Progress towards PT goals: Progressing toward goals    Frequency    Min 1X/week      PT Plan      Co-evaluation              AM-PAC PT "6 Clicks" Mobility   Outcome Measure  Help needed turning from your back to your side while in a flat bed without using bedrails?: None Help needed moving from lying on your back to sitting on the side of a flat bed without using bedrails?: A Little Help needed moving to and from a bed to a chair (including a wheelchair)?: A Little Help needed standing up from a chair using your arms (e.g., wheelchair or bedside chair)?: A Little Help needed to walk in hospital room?: A Little Help needed climbing 3-5 steps with a railing? : A Lot 6 Click Score: 18    End of Session Equipment Utilized During Treatment: Gait belt Activity Tolerance: Patient tolerated treatment well Patient left: in chair;with call bell/phone within reach;with chair alarm set   PT Visit Diagnosis: Unsteadiness on feet (R26.81);Other abnormalities of gait and mobility (R26.89);Muscle weakness (generalized) (M62.81);History of falling (Z91.81);Other symptoms and signs involving the nervous system (R29.898)     Time: 1610-9604 PT Time Calculation (min) (ACUTE ONLY): 11 min  Charges:    $Gait Training: 8-22 mins PT General Charges $$ ACUTE PT VISIT: 1 Visit                     Kathlyn Sacramento, PT, DPT [facility] Health  Rehabilitation Services Physical Therapist Office: [phone] Website: [email or web address]    Berton Mount 08/20/2023, 12:38 PM

## 2023-08-20 NOTE — TOC Progression Note (Signed)
Transition of Care [facility]) - Progression Note    Patient Details  Name: Jennifer Zhang MRN: [id number] Date of Birth: 1926-02-18  Transition of Care [facility]) CM/SW Contact  Braulio Kiedrowski A [place], [place] Phone Number: 08/20/2023, 12:00 PM  Clinical Narrative:     CSW contacted pt's daughter, Pearly regarding bed offers for SNF. CSW to meet with pt's daughter at bedside this afternoon to provide bed offers in person as she was unable to write down list and needed to leave conversation. CSW also informed her that [facility] bed offer.  TOC will continue to follow.  Expected Discharge Plan: Skilled Nursing Facility Barriers to Discharge: Continued Medical Work up, English as a second language teacher  Expected Discharge Plan and Services     Post Acute Care Choice: Skilled Nursing Facility Living arrangements for the past 2 months: Apartment                                       Social Determinants of Health (SDOH) Interventions SDOH Screenings   Food Insecurity: No Food Insecurity (08/17/2023)  Housing: Patient Declined (08/17/2023)  Transportation Needs: No Transportation Needs (08/17/2023)  Utilities: Not At Risk (08/17/2023)  Tobacco Use: Medium Risk (08/17/2023)    Readmission Risk Interventions     No data to display

## 2023-08-20 NOTE — Plan of Care (Signed)

## 2023-08-21 DIAGNOSIS — F05 Delirium due to known physiological condition: Secondary | ICD-10-CM | POA: Diagnosis not present

## 2023-08-21 DIAGNOSIS — F0390 Unspecified dementia without behavioral disturbance: Secondary | ICD-10-CM | POA: Diagnosis not present

## 2023-08-21 LAB — GLUCOSE, CAPILLARY
Glucose-Capillary: 152 mg/dL — ABNORMAL HIGH (ref 70–99)
Glucose-Capillary: 99 mg/dL (ref 70–99)
Glucose-Capillary: 181 mg/dL — ABNORMAL HIGH (ref 70–99)
Glucose-Capillary: 117 mg/dL — ABNORMAL HIGH (ref 70–99)

## 2023-08-21 NOTE — Plan of Care (Signed)

## 2023-08-21 NOTE — Progress Notes (Signed)
HD#4 Subjective:   Summary: This is a 89 year old female with a past medical history of hypertension, diabetes mellitus, hypothyroidism who presented for altered mental status.  Patient likely found to have delirium on top of dementia and admitted for further evaluation management.  Overnight Events: No acute events overnight  Patient evaluated sitting up in chair this morning.  She states she is doing well.  She has no concerns this morning.  She states she is eating and drinking well.  She states she had bowel movement with no concerns.  She denies any abdominal pain. She wishes to discharge home and not to an escalated level of care.  Objective:  Vital signs in last 24 hours: Vitals:   08/20/23 1613 08/20/23 2119 08/20/23 2319 08/21/23 0429  BP: (!) 155/72 (!) 160/76  (!) 158/79  Pulse: 78 89  88  Resp: 18 17  18   Temp: 98.5 F (36.9 C) 98.3 F (36.8 C)  98 F (36.7 C)  TempSrc: Oral Oral  Oral  SpO2: 100% (!) 87% 100% 100%  Weight:      Height:       Supplemental O2: Room Air SpO2: 100 %   Physical Exam:  Constitutional: Sitting up in chair, no acute distress Cardiovascular: Regular rate and rhythm, no murmurs, rubs, or gallops Neuro: Alert and oriented x 2, able to follow all instructions, tracking appropriately  Filed Weights   08/17/23 1152  Weight: 68 kg     Intake/Output Summary (Last 24 hours) at 08/21/2023 0700 Last data filed at 08/20/2023 1800 Gross per 24 hour  Intake --  Output 300 ml  Net -300 ml   Net IO Since Admission: 295 mL [08/21/23 0700]  Pertinent Labs:    Latest Ref Rng & Units 08/18/2023    5:26 AM 08/17/2023   11:44 AM 07/06/2023    5:36 AM  CBC  WBC 4.0 - 10.5 K/uL 7.8  9.6  9.0   Hemoglobin 12.0 - 15.0 g/dL 9.8  32.9  9.0   Hematocrit 36.0 - 46.0 % 30.4  32.4  27.9   Platelets 150 - 400 K/uL 205  232  190        Latest Ref Rng & Units 08/20/2023    5:35 AM 08/19/2023    4:14 AM 08/18/2023    5:26 AM  CMP  Glucose 70  - 99 mg/dL 518  841  72   BUN 8 - 23 mg/dL 13  12  11    Creatinine 0.44 - 1.00 mg/dL 6.60  6.30  1.60   Sodium 135 - 145 mmol/L 139  140  141   Potassium 3.5 - 5.1 mmol/L 3.8  3.7  2.9   Chloride 98 - 111 mmol/L 106  106  104   CO2 22 - 32 mmol/L 27  28  26    Calcium 8.9 - 10.3 mg/dL 8.8  8.6  8.5   Total Protein 6.5 - 8.1 g/dL   5.6   Total Bilirubin <1.2 mg/dL   1.2   Alkaline Phos 38 - 126 U/L   33   AST 15 - 41 U/L   20   ALT 0 - 44 U/L   16     Imaging: No results found.  Assessment/Plan:   Principal Problem:   Delirium superimposed on dementia Active Problems:   Type 2 diabetes mellitus without complication (HCC)   HTN (hypertension)   Hypothyroidism   Hypokalemia   Normocytic anemia   Hyperlipidemia   Patient  Summary: Jennifer Zhang is a 89 y.o. with a pertinent PMH of hypertension, diabetes, thyroid disease who presents with concerns about altered mental status. Patient found to have delirium with concern for subsequent dementia.  #Delirium superimposed on dementia #Vascular dementia versus Lewy body dementia Patient is appropriate today.  Able to converse with me.  She is alert and oriented x 2. Patient is aware of location "[facility] She thinks it is [birth year], 1948.  She states she has been moving with no concerns. Slurred speech is improving.  She denies any concerns this morning.  With concern for progression of dementia, likely patient will need higher level of care. PT/OT is recommending SNF. - Patient will discharge to SNF - Regular diet - Patient at baseline  #Hypothyroidism - Continue levothyroxine 50 mcg daily  #Hypertension Blood pressure is measuring slightly elevated into the 140s.  Will continue home medications. - Continue lisinopril 10 mg daily - Continue verapamil 240 mg daily  #Hyperlipidemia - Continue atorvastatin 40 mg daily  #Diabetes mellitus - Glucose has been measuring well during hospitalization.  Continue to  monitor  Diet:  Dysphagia 2 diet IVF: N/A VTE: Heparin Code: Full PT/OT recs: SNF for Subacute PT  Dispo: Anticipated discharge to Skilled nursing facility in 2 days pending placement and insurance.   Signed: Christie Nottingham, MD [facility] Health Physician, PGY-1 08/21/2023 7:00 AM

## 2023-08-21 NOTE — Plan of Care (Signed)
  Problem: Health Behavior/Discharge Planning: Goal: Ability to manage health-related needs will improve Outcome: Progressing   Problem: Clinical Measurements: Goal: Ability to maintain clinical measurements within normal limits will improve Outcome: Progressing   Problem: Activity: Goal: Risk for activity intolerance will decrease Outcome: Progressing   Problem: Nutrition: Goal: Adequate nutrition will be maintained Outcome: Progressing   Problem: Coping: Goal: Level of anxiety will decrease Outcome: Progressing   Problem: Elimination: Goal: Will not experience complications related to bowel motility Outcome: Progressing   Problem: Pain Management: Goal: General experience of comfort will improve Outcome: Progressing   Problem: Safety: Goal: Ability to remain free from injury will improve Outcome: Progressing   Problem: Skin Integrity: Goal: Risk for impaired skin integrity will decrease Outcome: Progressing

## 2023-08-22 DIAGNOSIS — F05 Delirium due to known physiological condition: Secondary | ICD-10-CM | POA: Diagnosis not present

## 2023-08-22 DIAGNOSIS — F0390 Unspecified dementia without behavioral disturbance: Secondary | ICD-10-CM | POA: Diagnosis not present

## 2023-08-22 LAB — GLUCOSE, CAPILLARY
Glucose-Capillary: 119 mg/dL — ABNORMAL HIGH (ref 70–99)
Glucose-Capillary: 159 mg/dL — ABNORMAL HIGH (ref 70–99)

## 2023-08-22 NOTE — Progress Notes (Addendum)
HD#5 Subjective:   Summary: This is a 89 year old female with a past medical history of hypertension, diabetes mellitus, hypothyroidism who presented for altered mental status.  Patient likely found to have delirium on top of dementia and admitted for further evaluation management.  Overnight Events: No acute events overnight  Met patient bedside this morning with IMTS rounding team under attending physician Dr Reymundo Poll . The patient stated that they had no major events overnight and that they were feeling "okay." She did not have any worsening of her presenting symptoms and no major new issues. They reported no nausea/vomiting. No diarrhea or constipation. No reported fevers or chills.   Objective:  Vital signs in last 24 hours: Vitals:   08/21/23 0758 08/21/23 1630 08/21/23 2053 08/22/23 0447  BP: 137/72 130/60 (!) 163/76 138/68  Pulse: 82 84 86 72  Resp: 18 18 17 18   Temp:  97.8 F (36.6 C) 98.3 F (36.8 C) 97.6 F (36.4 C)  TempSrc:   Oral Oral  SpO2:  100% 100% 100%  Weight:      Height:       Supplemental O2: Room Air SpO2: 100 %   Physical Exam:  Constitutional: Sitting up in chair, no acute distress Cardiovascular: Regular rate and rhythm, blowing holosystolic murmur heard, no rubs, or gallops. Pulm: Lungs CTAB Neuro: Alert and oriented x 2, able to follow all instructions, tracking appropriately  Filed Weights   08/17/23 1152  Weight: 68 kg     Intake/Output Summary (Last 24 hours) at 08/22/2023 0702 Last data filed at 08/21/2023 0853 Gross per 24 hour  Intake 80 ml  Output --  Net 80 ml   Net IO Since Admission: 375 mL [08/22/23 0702]  Pertinent Labs:    Latest Ref Rng & Units 08/18/2023    5:26 AM 08/17/2023   11:44 AM 07/06/2023    5:36 AM  CBC  WBC 4.0 - 10.5 K/uL 7.8  9.6  9.0   Hemoglobin 12.0 - 15.0 g/dL 9.8  40.1  9.0   Hematocrit 36.0 - 46.0 % 30.4  32.4  27.9   Platelets 150 - 400 K/uL 205  232  190        Latest Ref Rng  & Units 08/20/2023    5:35 AM 08/19/2023    4:14 AM 08/18/2023    5:26 AM  CMP  Glucose 70 - 99 mg/dL 027  253  72   BUN 8 - 23 mg/dL 13  12  11    Creatinine 0.44 - 1.00 mg/dL 6.64  4.03  4.74   Sodium 135 - 145 mmol/L 139  140  141   Potassium 3.5 - 5.1 mmol/L 3.8  3.7  2.9   Chloride 98 - 111 mmol/L 106  106  104   CO2 22 - 32 mmol/L 27  28  26    Calcium 8.9 - 10.3 mg/dL 8.8  8.6  8.5   Total Protein 6.5 - 8.1 g/dL   5.6   Total Bilirubin <1.2 mg/dL   1.2   Alkaline Phos 38 - 126 U/L   33   AST 15 - 41 U/L   20   ALT 0 - 44 U/L   16     Imaging: No results found.  Assessment/Plan:   Principal Problem:   Delirium superimposed on dementia Active Problems:   Type 2 diabetes mellitus without complication (HCC)   HTN (hypertension)   Hypothyroidism   Hypokalemia   Normocytic anemia  Hyperlipidemia   Patient Summary: Jennifer Zhang is a 89 y.o. with a pertinent PMH of hypertension, diabetes, thyroid disease who presents with concerns about altered mental status. Patient found to have delirium with concern for subsequent dementia.  Updated patient's daughter on 11/20 at 53 AM.  #Delirium superimposed on dementia #Vascular dementia versus Lewy body dementia Patient is appropriate today.  Able to converse with me.  She is alert and oriented x 2. Patient is aware of location "[facility] She "does not know the month or year."  She states she has been moving with no concerns. Slurred speech is improved.  She denies any concerns this morning.  With concern for progression of dementia, likely patient will need higher level of care. PT/OT is recommending SNF. - Patient will discharge to SNF - Regular diet - Patient at baseline  #Hypothyroidism - Continue levothyroxine 50 mcg daily  #Hypertension Blood pressure is measuring slightly elevated into the 140s.  Will continue home medications. - Continue lisinopril 10 mg daily - Continue verapamil 240 mg  daily  #Hyperlipidemia - Continue atorvastatin 40 mg daily  #Diabetes mellitus - Glucose has been measuring well during hospitalization.  Continue to monitor  Diet:  Dysphagia 2 diet IVF: N/A VTE: Heparin Code: Full PT/OT recs: SNF for Subacute PT  Dispo: Anticipated discharge to Skilled nursing facility in 2 days pending placement and insurance.   Signed: Christie Nottingham, MD [facility] Health Physician, PGY-1 08/22/2023 7:02 AM

## 2023-08-22 NOTE — Progress Notes (Signed)
Occupational Therapy Treatment Patient Details Name: Jennifer Zhang MRN: [id number] DOB: December 05, 1925 Today's Date: 08/22/2023   History of present illness 89 yo female presents to [facility] on 11/15 for mechanical fall and c/o right knee pain. Found to have Altered Mental Status.  PMH includes HTN, hypothyroidism, DMII.   OT comments  Pt assisted to bathroom for toileting with min assist and to sink for grooming with CGA. Pt completed UB dressing with set up in sitting. Pt requiring multimodal cues for hand placement with RW, repeatedly wanting to pull up on handles and walk behind walker. Patient will benefit from continued inpatient follow up therapy, <3 hours/day       If plan is discharge home, recommend the following:  A little help with walking and/or transfers;A lot of help with bathing/dressing/bathroom;Assistance with cooking/housework;Direct supervision/assist for medications management;Direct supervision/assist for financial management;Assist for transportation;Help with stairs or ramp for entrance   Equipment Recommendations  Other (comment) (defer)    Recommendations for Other Services      Precautions / Restrictions Precautions Precautions: Fall Restrictions Weight Bearing Restrictions: No       Mobility Bed Mobility               General bed mobility comments: received in chair    Transfers Overall transfer level: Needs assistance Equipment used: Rolling walker (2 wheels) Transfers: Sit to/from Stand Sit to Stand: Contact guard assist, Min assist           General transfer comment: multimodal cues for hand placement     Balance Overall balance assessment: Needs assistance   Sitting balance-Leahy Scale: Good     Standing balance support: Bilateral upper extremity supported, During functional activity Standing balance-Leahy Scale: Poor Standing balance comment: poor dynamic, fair static                           ADL either performed or  assessed with clinical judgement   ADL Overall ADL's : Needs assistance/impaired     Grooming: Wash/dry hands;Wash/dry face;Standing;Contact guard assist           Upper Body Dressing : Set up;Sitting       Toilet Transfer: Minimal assistance;Ambulation;Rolling walker (2 wheels);BSC/3in1   Toileting- Clothing Manipulation and Hygiene: Set up;Sitting/lateral lean       Functional mobility during ADLs: Minimal assistance;Rolling walker (2 wheels)      Extremity/Trunk Assessment              Vision       Perception     Praxis      Cognition Arousal: Alert Behavior During Therapy: WFL for tasks assessed/performed Overall Cognitive Status: No family/caregiver present to determine baseline cognitive functioning                                 General Comments: minimal ability to generalize education        Exercises      Shoulder Instructions       General Comments      Pertinent Vitals/ Pain       Pain Assessment Pain Assessment: No/denies pain  Home Living                                          Prior Functioning/Environment  Frequency  Min 1X/week        Progress Toward Goals  OT Goals(current goals can now be found in the care plan section)  Progress towards OT goals: Progressing toward goals  Acute Rehab OT Goals OT Goal Formulation: With patient Time For Goal Achievement: 09/01/23 Potential to Achieve Goals: Good  Plan      Co-evaluation                 AM-PAC OT "6 Clicks" Daily Activity     Outcome Measure   Help from another person eating meals?: None Help from another person taking care of personal grooming?: A Little Help from another person toileting, which includes using toliet, bedpan, or urinal?: A Little Help from another person bathing (including washing, rinsing, drying)?: A Lot Help from another person to put on and taking off regular upper body  clothing?: A Little Help from another person to put on and taking off regular lower body clothing?: A Lot 6 Click Score: 17    End of Session Equipment Utilized During Treatment: Rolling walker (2 wheels);Gait belt  OT Visit Diagnosis: Unsteadiness on feet (R26.81);Other abnormalities of gait and mobility (R26.89);Muscle weakness (generalized) (M62.81);Other symptoms and signs involving cognitive function   Activity Tolerance Patient tolerated treatment well   Patient Left in chair;with call bell/phone within reach;with chair alarm set   Nurse Communication          Time: 1230-1250 OT Time Calculation (min): 20 min  Charges: OT General Charges $OT Visit: 1 Visit OT Treatments $Self Care/Home Management : 8-22 mins  Berna Spare, OTR/L Acute Rehabilitation Services Office: [phone]   Evern Bio 08/22/2023, 1:40 PM

## 2023-08-22 NOTE — Progress Notes (Signed)
Physical Therapy Treatment Patient Details Name: Jennifer Zhang MRN: [id number] DOB: 04/05/1926 Today's Date: 08/22/2023   History of Present Illness 89 yo female presents to [facility] on 11/15 for mechanical fall and c/o right knee pain. Found to have Altered Mental Status.  PMH includes HTN, hypothyroidism, DMII.    PT Comments  Pt received in supine and agreeable to session. Pt requires increased time and cues for technique and safety during all mobility tasks. Pt noted to frequently reach outside of BOS for UE support requiring cues for safety. Pt demonstrates poor carryover of cues for upright posture and RW management during ambulation requiring intermittent assist for balance and obstacle negotiation. Attempted a few steps with HHA to promote upright posture, however pt reaches forward to RW and sink for support despite cues due to increased instability. Pt continues to benefit from PT services to progress toward functional mobility goals.    If plan is discharge home, recommend the following: A little help with walking and/or transfers;A little help with bathing/dressing/bathroom;Assistance with cooking/housework;Direct supervision/assist for medications management;Direct supervision/assist for financial management;Assist for transportation;Supervision due to cognitive status   Can travel by private vehicle     Yes  Equipment Recommendations  None recommended by PT    Recommendations for Other Services       Precautions / Restrictions Precautions Precautions: Fall Restrictions Weight Bearing Restrictions: No     Mobility  Bed Mobility Overal bed mobility: Needs Assistance Bed Mobility: Supine to Sit     Supine to sit: Contact guard     General bed mobility comments: cues for technique and increased time/effort to scoot forward at EOB    Transfers Overall transfer level: Needs assistance Equipment used: Rolling walker (2 wheels) Transfers: Sit to/from Stand Sit to  Stand: Contact guard assist, Min assist           General transfer comment: dense cues for hand placement and min A for power up progressing to CGA    Ambulation/Gait Ambulation/Gait assistance: Min assist Gait Distance (Feet): 40 Feet Assistive device: Rolling walker (2 wheels) Gait Pattern/deviations: Drifts right/left, Trunk flexed, Step-to pattern, Decreased stride length, Decreased dorsiflexion - right, Decreased dorsiflexion - left, Shuffle, Decreased step length - left Gait velocity: dec     General Gait Details: Pt demonstrates flexed trunk with RW pushed outside BOS with difficulty navigating around obstacles requiring intermittent assist. Dense cues for upright posture and RW proximity, but pt with little carryover. Pt able to demonstrate upright posture during static standing, but unable during ambulation. Pt demonstrates very low foot clearance and step length L>R.      Balance Overall balance assessment: Needs assistance Sitting-balance support: No upper extremity supported, Feet supported Sitting balance-Leahy Scale: Good Sitting balance - Comments: sitting EOB   Standing balance support: Bilateral upper extremity supported, During functional activity Standing balance-Leahy Scale: Poor Standing balance comment: with RW support. increased instability attempting steps with 1 HHA                            Cognition Arousal: Alert Behavior During Therapy: WFL for tasks assessed/performed Overall Cognitive Status: No family/caregiver present to determine baseline cognitive functioning                                 General Comments: increased cues with little carryover        Exercises  General Comments        Pertinent Vitals/Pain Pain Assessment Pain Assessment: No/denies pain     PT Goals (current goals can now be found in the care plan section) Acute Rehab PT Goals Patient Stated Goal: Get stronger PT Goal  Formulation: With patient Time For Goal Achievement: 09/01/23 Progress towards PT goals: Progressing toward goals    Frequency    Min 1X/week       AM-PAC PT "6 Clicks" Mobility   Outcome Measure  Help needed turning from your back to your side while in a flat bed without using bedrails?: None Help needed moving from lying on your back to sitting on the side of a flat bed without using bedrails?: A Little Help needed moving to and from a bed to a chair (including a wheelchair)?: A Little Help needed standing up from a chair using your arms (e.g., wheelchair or bedside chair)?: A Little Help needed to walk in hospital room?: A Little Help needed climbing 3-5 steps with a railing? : A Lot 6 Click Score: 18    End of Session Equipment Utilized During Treatment: Gait belt Activity Tolerance: Patient tolerated treatment well;Patient limited by fatigue Patient left: in chair;with call bell/phone within reach;with chair alarm set Nurse Communication: Mobility status PT Visit Diagnosis: Unsteadiness on feet (R26.81);Other abnormalities of gait and mobility (R26.89);Muscle weakness (generalized) (M62.81);History of falling (Z91.81);Other symptoms and signs involving the nervous system (R29.898)     Time: 4401-0272 PT Time Calculation (min) (ACUTE ONLY): 19 min  Charges:    $Gait Training: 8-22 mins PT General Charges $$ ACUTE PT VISIT: 1 Visit                    Johny Shock, PTA Acute Rehabilitation Services Secure Chat Preferred  [phone]    Johny Shock 08/22/2023, 9:48 AM

## 2023-08-22 NOTE — Plan of Care (Signed)

## 2023-08-23 DIAGNOSIS — F05 Delirium due to known physiological condition: Secondary | ICD-10-CM | POA: Diagnosis not present

## 2023-08-23 MED ORDER — METFORMIN HCL 500 MG PO TABS: 500.0000 mg | ORAL_TABLET | Freq: Every day | ORAL | Status: AC

## 2023-08-23 MED ORDER — LISINOPRIL 10 MG PO TABS: 10.0000 mg | ORAL_TABLET | Freq: Every day | ORAL | Status: AC

## 2023-08-23 MED ORDER — ATORVASTATIN CALCIUM 40 MG PO TABS: 40.0000 mg | ORAL_TABLET | Freq: Every day | ORAL | Status: AC

## 2023-08-23 MED ORDER — ISOSORBIDE MONONITRATE 10 MG PO TABS: 10.0000 mg | ORAL_TABLET | Freq: Two times a day (BID) | ORAL | Status: AC

## 2023-08-23 MED ORDER — NITROGLYCERIN 0.4 MG SL SUBL: 0.4000 mg | SUBLINGUAL_TABLET | SUBLINGUAL | Status: AC | PRN

## 2023-08-23 MED ORDER — TORSEMIDE 10 MG PO TABS: 10.0000 mg | ORAL_TABLET | Freq: Every day | ORAL | Status: AC

## 2023-08-23 MED ORDER — VERAPAMIL HCL ER 240 MG PO TBCR: 240.0000 mg | EXTENDED_RELEASE_TABLET | Freq: Every day | ORAL | Status: AC

## 2023-08-23 MED ORDER — SENNOSIDES-DOCUSATE SODIUM 8.6-50 MG PO TABS: 1.0000 | ORAL_TABLET | Freq: Every evening | ORAL | Status: AC | PRN

## 2023-08-23 MED ORDER — LEVOTHYROXINE SODIUM 50 MCG PO TABS: 50.0000 ug | ORAL_TABLET | Freq: Every day | ORAL | Status: AC

## 2023-08-23 NOTE — Discharge Instructions (Addendum)
Ms Ao It was a pleasure taking care of you at [facility]. You were admitted for altered mental status and treated for delirium, hypokalemia, and mild neurocognitive disorder . We are discharging you to a facility that can assist you in managing activities of daily living now that you are doing better.   1) In the event of acute confusion or worsening mental status, please come back to a hospital 2) Most of the medications for chronic dementia are not recommended for someone with relatively stable disease. We are not discharging you with these medications because the risks would outweigh the benefits.   Take care,   Signed: Christie Nottingham, MD [facility] Health Physician, PGY-1 08/23/2023 11:12 AM

## 2023-08-23 NOTE — Progress Notes (Signed)
Mobility Specialist Progress Note:    08/23/23 0931  Mobility  Activity Transferred from bed to chair  Level of Assistance Moderate assist, patient does 50-74%  Assistive Device Front wheel walker  Distance Ambulated (ft) 3 ft  Activity Response Tolerated well  Mobility Referral Yes  $Mobility charge 1 Mobility  Mobility Specialist Start Time (ACUTE ONLY) 0931  Mobility Specialist Stop Time (ACUTE ONLY) 0943  Mobility Specialist Time Calculation (min) (ACUTE ONLY) 12 min   Pt received in bed, possibly urine incontinent, required sheet change d/t voiding in bed. NT assisted in peri care. Transferred pt B>C with RW, required ModA for safety. Pt acknowledges cues, but has difficulty following them.Tolerated well, asx throughout. Left pt in chair, alarm on, call bell in reach, all needs met.   Feliciana Rossetti Mobility Specialist Please contact via Special educational needs teacher or  Rehab office at [phone]

## 2023-08-23 NOTE — Plan of Care (Signed)

## 2023-08-23 NOTE — TOC Progression Note (Addendum)
Transition of Care [facility]) - Progression Note    Patient Details  Name: Jennifer Zhang MRN: [id number] Date of Birth: 08/30/1926  Transition of Care [facility]) CM/SW Contact  Brenn Deziel A [place], [place] Phone Number: 08/23/2023, 9:00 AM  Clinical Narrative:     [phone] Reauthorization request approved for [facility] and Rehab. DC scheduled for today.   [facility]erkley Harvey ID [id number] Approval Dates 11/20- 08/24/23   CSW reached out to [facility] to change pt's authorization facility to [facility]. There is a bed available today. Possible DC today.    TOC will continue to follow.   Expected Discharge Plan: Skilled Nursing Facility Barriers to Discharge: Continued Medical Work up, English as a second language teacher  Expected Discharge Plan and Services     Post Acute Care Choice: Skilled Nursing Facility Living arrangements for the past 2 months: Apartment                                       Social Determinants of Health (SDOH) Interventions SDOH Screenings   Food Insecurity: No Food Insecurity (08/17/2023)  Housing: Patient Declined (08/17/2023)  Transportation Needs: No Transportation Needs (08/17/2023)  Utilities: Not At Risk (08/17/2023)  Tobacco Use: Medium Risk (08/17/2023)    Readmission Risk Interventions     No data to display

## 2023-08-23 NOTE — Discharge Summary (Addendum)
Name: Jennifer Zhang MRN: [id number] DOB: [birth year] 89 y.o. PCP: Nona Dell, NP  Date of Admission: 08/17/2023 11:22 AM Date of Discharge: 08/23/2023 Attending Physician: Ginnie Smart, MD  Discharge Diagnosis: 1. Principal Problem:   Delirium superimposed on dementia Active Problems:   Type 2 diabetes mellitus without complication (HCC)   HTN (hypertension)   Hypothyroidism   Hypokalemia   Normocytic anemia   Hyperlipidemia    Discharge Medications: Allergies as of 08/23/2023       Reactions   Tape Other (See Comments)   SKIN WILL TEAR EASILY!!   Aspirin Other (See Comments), Palpitations   Heart flutters Heart flutter        Medication List     STOP taking these medications    Cleansing Cloths Flushable Misc   SYSTANE BALANCE OP       TAKE these medications    atorvastatin 40 MG tablet Commonly known as: LIPITOR Take 1 tablet (40 mg total) by mouth at bedtime.   isosorbide mononitrate 10 MG tablet Commonly known as: ISMO Take 1 tablet (10 mg total) by mouth 2 (two) times daily.   levothyroxine 50 MCG tablet Commonly known as: SYNTHROID Take 1 tablet (50 mcg total) by mouth daily before breakfast.   lisinopril 10 MG tablet Commonly known as: ZESTRIL Take 1 tablet (10 mg total) by mouth daily.   metFORMIN 500 MG tablet Commonly known as: GLUCOPHAGE Take 1 tablet (500 mg total) by mouth daily.   nitroGLYCERIN 0.4 MG SL tablet Commonly known as: NITROSTAT Place 1 tablet (0.4 mg total) under the tongue every 5 (five) minutes as needed for chest pain.   senna-docusate 8.6-50 MG tablet Commonly known as: Senokot-S Take 1 tablet by mouth at bedtime as needed for mild constipation.   torsemide 10 MG tablet Commonly known as: DEMADEX Take 1 tablet (10 mg total) by mouth daily.   verapamil 240 MG CR tablet Commonly known as: CALAN-SR Take 1 tablet (240 mg total) by mouth daily.        Disposition and follow-up:    Jennifer Zhang was discharged from [facility] in Good condition.  At the hospital follow up visit please address:  1.  Chronic dementia and goals of care.   2.  Labs / imaging needed at time of follow-up: None  3.  Pending labs/ test needing follow-up: None  Follow-up Appointments:  Contact information for after-discharge care     Destination     [facility] .   Service: Skilled Nursing Contact information: [address] [place] [address] [phone]                     Hospital Course by problem list: Jennifer Zhang is a 89 y.o. with a pertinent PMH of hypertension, diabetes, thyroid disease who presents with concerns about altered mental status. Patient found to have delirium with concern for subsequent dementia. Patient is discharging to [facility] facility.     #Delirium superimposed on dementia #Vascular dementia versus lewy body dementia Patient is appropriate today. Able to converse with me. She is alert and oriented x 2. Patient is aware of location "[facility]." She believes it is March 1950. She states she has been moving with no concerns. Slurred speech is improved.  She denies any concerns this morning.  With concern for progression of dementia, likely patient will need higher level of care. PT/OT is recommending SNF. - Patient will discharge to  [facility] facility.  - Regular diet - Patient at baseline   #Hypothyroidism - Continue levothyroxine 50 mcg daily   #Hypertension Blood pressure is measuring slightly elevated into the 140s.  Will continue home medications. - Continue lisinopril 10 mg daily - Continue verapamil 240 mg daily   #Hyperlipidemia - Continue atorvastatin 40 mg daily   #Diabetes mellitus - Glucose has been measuring well during hospitalization.  Continue to monitor   Diet:  Dysphagia 2 diet IVF: N/A VTE: Heparin Code: Full  Discharge Exam:   BP (!) 153/77 (BP  Location: Right Arm)   Pulse 79   Temp 97.9 F (36.6 C) (Oral)   Resp 18   Ht 5' (1.524 m)   Wt 68 kg   SpO2 100%   BMI 29.29 kg/m  Discharge exam:  Physical Exam Vitals and nursing note reviewed.  Constitutional:      General: She is not in acute distress.    Appearance: Normal appearance. She is not ill-appearing.  HENT:     Head: Normocephalic and atraumatic.     Mouth/Throat:     Mouth: Mucous membranes are moist.  Eyes:     Extraocular Movements: Extraocular movements intact.     Pupils: Pupils are equal, round, and reactive to light.  Cardiovascular:     Pulses: Normal pulses.     Heart sounds: Normal heart sounds.  Pulmonary:     Effort: Pulmonary effort is normal.     Breath sounds: Normal breath sounds.  Abdominal:     General: Abdomen is flat. Bowel sounds are normal.     Palpations: Abdomen is soft.  Musculoskeletal:        General: Normal range of motion.     Cervical back: Normal range of motion.  Skin:    General: Skin is warm and dry.  Neurological:     Mental Status: She is alert. Mental status is at baseline. She is disoriented.  Psychiatric:        Behavior: Behavior normal.    Pertinent Labs, Studies, and Procedures:   Latest Reference Range & Units 08/20/23 08:12 08/20/23 16:14 08/20/23 20:39 08/21/23 08:13 08/21/23 13:07 08/21/23 16:29 08/21/23 20:54 08/22/23 07:38 08/22/23 15:34  Glucose-Capillary 70 - 99 mg/dL 202 (H) 542 (H) 706 (H) 152 (H) 99 181 (H) 117 (H) 119 (H) 159 (H)  (H): Data is abnormally high   Latest Reference Range & Units 08/20/23 05:35  BASIC METABOLIC PANEL    Sodium 135 - 145 mmol/L 139  Potassium 3.5 - 5.1 mmol/L 3.8  Chloride 98 - 111 mmol/L 106  CO2 22 - 32 mmol/L 27  BUN 8 - 23 mg/dL 13  Creatinine 2.37 - 6.28 mg/dL 3.15  Calcium 8.9 - 17.6 mg/dL 8.8 (L)  Anion gap 5 - 15  6  GFR, Estimated >60 mL/min >60   Rpt: View report in Results Review for more information  Discharge Instructions: Discharge Instructions      Call MD for:   Complete by: As directed    Please return to the hospital for acute changes in mental status.   Call MD for:  difficulty breathing, headache or visual disturbances   Complete by: As directed    Call MD for:  extreme fatigue   Complete by: As directed    Call MD for:  persistant dizziness or light-headedness   Complete by: As directed    Call MD for:  persistant nausea and vomiting   Complete by: As directed    Diet  general   Complete by: As directed    Discharge instructions   Complete by: As directed    Jennifer Zhang It was a pleasure taking care of you at [facility]. You were admitted for altered mental status and treated for delirium, hypokalemia, and mild neurocognitive disorder . We are discharging you to a facility that can assist you in managing activities of daily living now that you are doing better.   1) In the event of acute confusion or worsening mental status, please come back to a hospital 2) Most of the medications for chronic dementia are not recommended for someone with relatively stable disease. We are not discharging you with these medications because the risks would outweigh the benefits.   Take care,   Signed: Christie Nottingham, MD [facility] Health Physician, PGY-1 08/23/2023 11:12 AM     Dear Jennifer Zhang,  Jennifer Zhang It was a pleasure taking care of you at [facility]. You were admitted for altered mental status and treated for delirium, hypokalemia, and mild neurocognitive disorder . We are discharging you to a facility that can assist you in managing activities of daily living now that you are doing better.   1) In the event of acute confusion or worsening mental status, please come back to a hospital 2) Most of the medications for chronic dementia are not recommended for someone with relatively stable disease. We are not discharging you with these medications because the risks would outweigh the benefits.  Take care,    Signed: Margaretmary Dys, MD 08/23/2023, 11:44 AM   Pager: [phone]

## 2023-08-23 NOTE — TOC Transition Note (Signed)
Transition of Care [facility]) - CM/SW Discharge Note   Patient Details  Name: Jennifer Zhang MRN: [id number] Date of Birth: March 16, 1926  [facility]) CM/SW Contact:  Schae Cando A [place], Theresia Majors Phone Number: 08/23/2023, 12:15 PM   Clinical Narrative:     Patient will DC to: [facility] and Rehab  Anticipated DC date: 08/23/23  Family notified: Pearly Pauling  Transport by: Theodoro Grist ID [id number] Reference ID [id number]   Approval Dates:  08/22/2023-08/24/2023   Per MD patient ready for DC to [facility] and [facility]. RN, patient, patient's family, and facility notified of DC. Discharge Summary and FL2 sent to facility. RN to call report prior to discharge (212B, [phone]). DC packet on chart. Ambulance transport requested for patient.     CSW will sign off for now as social work intervention is no longer needed. Please consult [place] again if new needs arise.   Final next level of care: Skilled Nursing Facility Barriers to Discharge: Barriers Resolved   Patient Goals and CMS Choice CMS Medicare.gov Compare Post Acute Care list provided to:: Patient Represenative (must comment) Choice offered to / list presented to : Adult Children  Discharge Placement                Patient chooses bed at: [facility] Patient to be transferred to facility by: [facility] Name of family member notified: Pearly Pauling Patient and family notified of of transfer: 08/23/23  Discharge Plan and Services Additional resources added to the After Visit Summary for       Post Acute Care Choice: Skilled Nursing Facility                               Social Determinants of Health (SDOH) Interventions SDOH Screenings   Food Insecurity: No Food Insecurity (08/17/2023)  Housing: Patient Declined (08/17/2023)  Transportation Needs: No Transportation Needs (08/17/2023)  Utilities: Not At Risk (08/17/2023)  Tobacco Use: Medium Risk (08/17/2023)     Readmission Risk  Interventions     No data to display

## 2023-09-13 ENCOUNTER — Encounter (HOSPITAL_COMMUNITY): Payer: Self-pay | Admitting: Emergency Medicine

## 2023-09-13 ENCOUNTER — Emergency Department (HOSPITAL_COMMUNITY)
Admission: EM | Admit: 2023-09-13 | Discharge: 2023-09-14 | Disposition: A | Discharge status: Home / Self Care | Payer: 59 | Attending: Emergency Medicine | Admitting: Emergency Medicine

## 2023-09-13 DIAGNOSIS — Z79899 Other long term (current) drug therapy: Secondary | ICD-10-CM | POA: Insufficient documentation

## 2023-09-13 DIAGNOSIS — Z7984 Long term (current) use of oral hypoglycemic drugs: Secondary | ICD-10-CM | POA: Diagnosis not present

## 2023-09-13 DIAGNOSIS — D649 Anemia, unspecified: Secondary | ICD-10-CM | POA: Diagnosis not present

## 2023-09-13 DIAGNOSIS — F0390 Unspecified dementia without behavioral disturbance: Secondary | ICD-10-CM | POA: Diagnosis not present

## 2023-09-13 DIAGNOSIS — R451 Restlessness and agitation: Secondary | ICD-10-CM | POA: Diagnosis present

## 2023-09-13 DIAGNOSIS — E876 Hypokalemia: Secondary | ICD-10-CM

## 2023-09-13 LAB — CBC WITH DIFFERENTIAL/PLATELET
Abs Immature Granulocytes: 0.02 10*3/uL (ref 0.00–0.07)
Basophils Absolute: 0 10*3/uL (ref 0.0–0.1)
Basophils Relative: 0 %
Eosinophils Absolute: 0.2 10*3/uL (ref 0.0–0.5)
Eosinophils Relative: 2 %
HCT: 34.4 % — ABNORMAL LOW (ref 36.0–46.0)
Hemoglobin: 11.1 g/dL — ABNORMAL LOW (ref 12.0–15.0)
Immature Granulocytes: 0 %
Lymphocytes Relative: 24 %
Lymphs Abs: 2.2 10*3/uL (ref 0.7–4.0)
MCH: 30 pg (ref 26.0–34.0)
MCHC: 32.3 g/dL (ref 30.0–36.0)
MCV: 93 fL (ref 80.0–100.0)
Monocytes Absolute: 0.7 10*3/uL (ref 0.1–1.0)
Monocytes Relative: 7 %
Neutro Abs: 6.2 10*3/uL (ref 1.7–7.7)
Neutrophils Relative %: 67 %
Platelets: 234 10*3/uL (ref 150–400)
RBC: 3.7 MIL/uL — ABNORMAL LOW (ref 3.87–5.11)
RDW: 15.1 % (ref 11.5–15.5)
WBC: 9.3 10*3/uL (ref 4.0–10.5)
nRBC: 0 % (ref 0.0–0.2)

## 2023-09-13 LAB — URINALYSIS, ROUTINE W REFLEX MICROSCOPIC
Bacteria, UA: NONE SEEN
Bilirubin Urine: NEGATIVE
Glucose, UA: NEGATIVE mg/dL
Hgb urine dipstick: NEGATIVE
Ketones, ur: NEGATIVE mg/dL
Nitrite: NEGATIVE
Protein, ur: NEGATIVE mg/dL
Specific Gravity, Urine: 1.013 (ref 1.005–1.030)
pH: 5 (ref 5.0–8.0)

## 2023-09-13 LAB — COMPREHENSIVE METABOLIC PANEL
ALT: 17 U/L (ref 0–44)
AST: 16 U/L (ref 15–41)
Albumin: 4.1 g/dL (ref 3.5–5.0)
Alkaline Phosphatase: 61 U/L (ref 38–126)
Anion gap: 8 (ref 5–15)
BUN: 18 mg/dL (ref 8–23)
CO2: 28 mmol/L (ref 22–32)
Calcium: 9.1 mg/dL (ref 8.9–10.3)
Chloride: 105 mmol/L (ref 98–111)
Creatinine, Ser: 0.77 mg/dL (ref 0.44–1.00)
GFR, Estimated: >60 mL/min (ref >60)
Glucose, Bld: 125 mg/dL — ABNORMAL HIGH (ref 70–99)
Potassium: 3.1 mmol/L — ABNORMAL LOW (ref 3.5–5.1)
Sodium: 141 mmol/L (ref 135–145)
Total Bilirubin: 0.9 mg/dL (ref <1.2)
Total Protein: 7.4 g/dL (ref 6.5–8.1)

## 2023-09-13 MED ORDER — POTASSIUM CHLORIDE CRYS ER 20 MEQ PO TBCR: 40.0000 meq | EXTENDED_RELEASE_TABLET | Freq: Once | ORAL | Status: DC

## 2023-09-13 NOTE — ED Provider Notes (Signed)
[place] EMERGENCY DEPARTMENT AT [facility] Provider Note   CSN: [id number] Arrival date & time: 09/13/23  1955     History  Chief Complaint  Patient presents with   Agitation    Jennifer Zhang is a 89 y.o. female.  The history is provided by the patient, a relative and medical records. The history is limited by the condition of the patient. No language interpreter was used.  Illness Location:  Patient has no complaints but was sent to rule out UTI Severity:  Unable to specify Onset quality:  Unable to specify Progression:  Resolved Chronicity:  Recurrent Associated symptoms: no abdominal pain, no chest pain, no congestion, no cough, no diarrhea, no fatigue, no fever, no headaches, no loss of consciousness, no nausea, no rash, no rhinorrhea, no shortness of breath, no vomiting and no wheezing        Home Medications Prior to Admission medications   Medication Sig Start Date End Date Taking? Authorizing Provider  atorvastatin (LIPITOR) 40 MG tablet Take 1 tablet (40 mg total) by mouth at bedtime. 08/23/23   Margaretmary Dys, MD  isosorbide mononitrate (ISMO) 10 MG tablet Take 1 tablet (10 mg total) by mouth 2 (two) times daily. 08/23/23   Margaretmary Dys, MD  levothyroxine (SYNTHROID) 50 MCG tablet Take 1 tablet (50 mcg total) by mouth daily before breakfast. 08/23/23   Margaretmary Dys, MD  lisinopril (ZESTRIL) 10 MG tablet Take 1 tablet (10 mg total) by mouth daily. 08/23/23 08/22/24  Margaretmary Dys, MD  metFORMIN (GLUCOPHAGE) 500 MG tablet Take 1 tablet (500 mg total) by mouth daily. 08/23/23   Margaretmary Dys, MD  nitroGLYCERIN (NITROSTAT) 0.4 MG SL tablet Place 1 tablet (0.4 mg total) under the tongue every 5 (five) minutes as needed for chest pain. 08/23/23   Margaretmary Dys, MD  senna-docusate (SENOKOT-S) 8.6-50 MG tablet Take 1 tablet by mouth at bedtime as  needed for mild constipation. 08/23/23   Margaretmary Dys, MD  torsemide (DEMADEX) 10 MG tablet Take 1 tablet (10 mg total) by mouth daily. 08/23/23   Margaretmary Dys, MD  verapamil (CALAN-SR) 240 MG CR tablet Take 1 tablet (240 mg total) by mouth daily. 08/23/23   Margaretmary Dys, MD      Allergies    Tape and Aspirin    Review of Systems   Review of Systems  Constitutional:  Negative for chills, fatigue and fever.  HENT:  Negative for congestion and rhinorrhea.   Respiratory:  Negative for cough, chest tightness, shortness of breath and wheezing.   Cardiovascular:  Negative for chest pain, palpitations and leg swelling.  Gastrointestinal:  Negative for abdominal pain, constipation, diarrhea, nausea and vomiting.  Genitourinary:  Negative for dysuria and flank pain.  Musculoskeletal:  Negative for back pain, neck pain and neck stiffness.  Skin:  Negative for rash and wound.  Neurological:  Negative for loss of consciousness, weakness, light-headedness, numbness and headaches.  Psychiatric/Behavioral:  Negative for agitation and confusion.   All other systems reviewed and are negative.   Physical Exam Updated Vital Signs BP 138/76 (BP Location: Right Arm)   Pulse 91   Temp 97.8 F (36.6 C) (Oral)   Resp 18   SpO2 96%  Physical Exam Vitals and nursing note reviewed.  Constitutional:      General: She is not in acute distress.    Appearance: She is well-developed. She is  not ill-appearing, toxic-appearing or diaphoretic.  HENT:     Head: Normocephalic and atraumatic.     Nose: No congestion or rhinorrhea.     Mouth/Throat:     Mouth: Mucous membranes are moist.     Pharynx: No oropharyngeal exudate or posterior oropharyngeal erythema.  Eyes:     Extraocular Movements: Extraocular movements intact.     Conjunctiva/sclera: Conjunctivae normal.     Pupils: Pupils are equal, round, and reactive to light.  Cardiovascular:     Rate  and Rhythm: Normal rate and regular rhythm.     Heart sounds: No murmur heard. Pulmonary:     Effort: Pulmonary effort is normal. No respiratory distress.     Breath sounds: Normal breath sounds. No wheezing, rhonchi or rales.  Chest:     Chest wall: No tenderness.  Abdominal:     General: Abdomen is flat.     Palpations: Abdomen is soft.     Tenderness: There is no abdominal tenderness. There is no right CVA tenderness, left CVA tenderness, guarding or rebound.  Musculoskeletal:        General: No swelling or tenderness.     Cervical back: Neck supple. No tenderness.  Skin:    General: Skin is warm and dry.     Capillary Refill: Capillary refill takes less than 2 seconds.     Findings: No erythema or rash.  Neurological:     General: No focal deficit present.     Mental Status: She is alert.  Psychiatric:        Mood and Affect: Mood normal.     ED Results / Procedures / Treatments   Labs (all labs ordered are listed, but only abnormal results are displayed) Labs Reviewed  URINALYSIS, ROUTINE W REFLEX MICROSCOPIC - Abnormal; Notable for the following components:      Result Value   APPearance HAZY (*)    Leukocytes,Ua LARGE (*)    All other components within normal limits  COMPREHENSIVE METABOLIC PANEL - Abnormal; Notable for the following components:   Potassium 3.1 (*)    Glucose, Bld 125 (*)    All other components within normal limits  CBC WITH DIFFERENTIAL/PLATELET - Abnormal; Notable for the following components:   RBC 3.70 (*)    Hemoglobin 11.1 (*)    HCT 34.4 (*)    All other components within normal limits  URINE CULTURE    EKG None  Radiology No results found.  Procedures Procedures    Medications Ordered in ED Medications  potassium chloride SA (KLOR-CON M) CR tablet 40 mEq (has no administration in time range)    ED Course/ Medical Decision Making/ A&P                                 Medical Decision Making Amount and/or Complexity of  Data Reviewed Labs: ordered.    Jennifer Zhang is a 89 y.o. female with a past medical history significant for dementia, hypertension, hypothyroidism, and anemia who presents for UTI rule out.  According to family whom I spoke on the phone, they were concerned patient may have had a UTI causing to be more aggressive today.  Patient was very calm during my evaluation and was denying any complaints.  She specifically denied any wrist, chills, chest pain, shortness of breath, nausea, vomiting, constipation, diarrhea, or urinary changes.  She said that she was feeling well and wanted to go home.  On exam, lungs clear.  Chest nontender.  Abdomen nontender.  Patient moving all extremities.  Patient answering questions fairly appropriately and knows she is at [facility] emergency department.  She is not agitated and has no complaints.  I called and spoke with family who reported that patient was sent mainly for UTI rule out.    Patient had some screening labs including urinalysis which had a culture 2 and she had a CBC and CMP.  CBC did not show leukocytosis and showed mild anemia.  Urinalysis showed leukocytes but no nitrites or bacteria.  Doubt UTI based on this finding however culture was sent.  Metabolic panel only revealed slightly low potassium and oral potassium was ordered.  Given lack of other complaints I agree with holding on more extensive workup at this time.  I spoke to the family who agrees that she does not seem to need further workup.  They think this is her dementia and not other infection.  We feel safe with her discharging home and they understand return precautions and follow-up instructions.  She had no other questions or concerns and will be discharged to family.         Final Clinical Impression(s) / ED Diagnoses Final diagnoses:  Dementia without behavioral disturbance, psychotic disturbance, mood disturbance, or anxiety, unspecified dementia severity, unspecified dementia  type (HCC)    Rx / DC Orders ED Discharge Orders     None      Clinical Impression: 1. Dementia without behavioral disturbance, psychotic disturbance, mood disturbance, or anxiety, unspecified dementia severity, unspecified dementia type (HCC)     Disposition: Discharge  Condition: Good  I have discussed the results, Dx and Tx plan with the pt(& family if present). He/she/they expressed understanding and agree(s) with the plan. Discharge instructions discussed at great length. Strict return precautions discussed and pt &/or family have verbalized understanding of the instructions. No further questions at time of discharge.    New Prescriptions   No medications on file    Follow Up: Nona Dell, NP [address] [place] [address] [phone]        Timothee Gali, Canary Brim, MD 09/13/23 [phone]

## 2023-09-13 NOTE — ED Triage Notes (Addendum)
PT sent by hospice due to aggressive behaviors. Desiring to r/o UTI. Per EMS, she is not having behavioral issues currently. Unsure why pt is in hospice services. Dtr called hospice first. Haldol was started today and one dose was given at around 1700.

## 2023-09-13 NOTE — ED Notes (Signed)
PT has had NO behavioral incidents and has been calm and cooperative the entire time.

## 2023-09-13 NOTE — Discharge Instructions (Signed)
Your history, exam, and evaluation today did not reveal clear evidence of urinary tract infection as your family was suspicious of.  Your vital signs were reassuring as was your exam.  You have no complaints and are appearing delirious at this time.  We feel you are safe for discharge home and I spoke with your family who agrees.  Please rest and stay hydrated.  A urine culture was sent on your urine and if something grows out they will call you.  If any symptoms change or worsen acutely, return to the nearest emergency department.  Please follow-up with your primary doctor.

## 2023-09-14 LAB — URINE CULTURE

## 2023-11-03 DEATH — deceased
